# Patient Record
Sex: Female | Born: 1980 | Race: White | Hispanic: No | Marital: Single | Smoking: Former smoker
Health system: Southern US, Community
[De-identification: ages and names within clinical notes are randomized; demographics above are authoritative.]

## PROBLEM LIST (undated history)

## (undated) DIAGNOSIS — R569 Unspecified convulsions: Secondary | ICD-10-CM

## (undated) DIAGNOSIS — N200 Calculus of kidney: Secondary | ICD-10-CM

## (undated) DIAGNOSIS — J45909 Unspecified asthma, uncomplicated: Secondary | ICD-10-CM

## (undated) DIAGNOSIS — R197 Diarrhea, unspecified: Secondary | ICD-10-CM

## (undated) DIAGNOSIS — K589 Irritable bowel syndrome without diarrhea: Secondary | ICD-10-CM

## (undated) DIAGNOSIS — D509 Iron deficiency anemia, unspecified: Secondary | ICD-10-CM

## (undated) DIAGNOSIS — K3184 Gastroparesis: Secondary | ICD-10-CM

## (undated) DIAGNOSIS — G8929 Other chronic pain: Secondary | ICD-10-CM

## (undated) DIAGNOSIS — R112 Nausea with vomiting, unspecified: Secondary | ICD-10-CM

## (undated) DIAGNOSIS — K9089 Other intestinal malabsorption: Secondary | ICD-10-CM

## (undated) DIAGNOSIS — K219 Gastro-esophageal reflux disease without esophagitis: Secondary | ICD-10-CM

## (undated) DIAGNOSIS — N3010 Interstitial cystitis (chronic) without hematuria: Secondary | ICD-10-CM

## (undated) DIAGNOSIS — M797 Fibromyalgia: Secondary | ICD-10-CM

## (undated) DIAGNOSIS — G43909 Migraine, unspecified, not intractable, without status migrainosus: Secondary | ICD-10-CM

## (undated) DIAGNOSIS — K2100 Gastro-esophageal reflux disease with esophagitis, without bleeding: Secondary | ICD-10-CM

## (undated) DIAGNOSIS — R109 Unspecified abdominal pain: Secondary | ICD-10-CM

## (undated) DIAGNOSIS — A6920 Lyme disease, unspecified: Secondary | ICD-10-CM

## (undated) HISTORY — PX: SINUSOTOMY: SHX291

## (undated) HISTORY — PX: CHOLECYSTECTOMY: SHX55

## (undated) HISTORY — PX: PORT-A-CATH REMOVAL: SHX5289

## (undated) HISTORY — PX: OTHER SURGICAL HISTORY: SHX169

## (undated) HISTORY — PX: NASAL SINUS SURGERY: SHX719

## (undated) HISTORY — DX: Iron deficiency anemia, unspecified: D50.9

## (undated) HISTORY — PX: EXPLORATORY LAPAROTOMY: SUR591

---
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
## 2001-07-20 ENCOUNTER — Encounter: Payer: Self-pay | Admitting: Emergency Medicine

## 2001-07-20 ENCOUNTER — Emergency Department (HOSPITAL_COMMUNITY): Admission: EM | Admit: 2001-07-20 | Discharge: 2001-07-20 | Payer: Self-pay | Admitting: Emergency Medicine

## 2001-10-13 ENCOUNTER — Ambulatory Visit (HOSPITAL_COMMUNITY): Admission: RE | Admit: 2001-10-13 | Discharge: 2001-10-13 | Payer: Self-pay | Admitting: Pediatrics

## 2001-10-13 ENCOUNTER — Encounter: Payer: Self-pay | Admitting: Pediatrics

## 2001-11-24 ENCOUNTER — Encounter: Payer: Self-pay | Admitting: Urology

## 2001-11-24 ENCOUNTER — Encounter: Admission: RE | Admit: 2001-11-24 | Discharge: 2001-11-24 | Payer: Self-pay | Admitting: Urology

## 2001-12-22 ENCOUNTER — Other Ambulatory Visit: Admission: RE | Admit: 2001-12-22 | Discharge: 2001-12-22 | Payer: Self-pay | Admitting: Obstetrics & Gynecology

## 2002-02-13 ENCOUNTER — Emergency Department (HOSPITAL_COMMUNITY): Admission: EM | Admit: 2002-02-13 | Discharge: 2002-02-13 | Payer: Self-pay

## 2002-06-11 ENCOUNTER — Encounter: Payer: Self-pay | Admitting: Emergency Medicine

## 2002-06-11 ENCOUNTER — Emergency Department (HOSPITAL_COMMUNITY): Admission: EM | Admit: 2002-06-11 | Discharge: 2002-06-11 | Payer: Self-pay | Admitting: Emergency Medicine

## 2003-04-15 ENCOUNTER — Emergency Department (HOSPITAL_COMMUNITY): Admission: EM | Admit: 2003-04-15 | Discharge: 2003-04-16 | Payer: Self-pay | Admitting: Emergency Medicine

## 2003-04-16 ENCOUNTER — Encounter: Payer: Self-pay | Admitting: Emergency Medicine

## 2003-04-19 ENCOUNTER — Other Ambulatory Visit: Admission: RE | Admit: 2003-04-19 | Discharge: 2003-04-19 | Payer: Self-pay | Admitting: Obstetrics & Gynecology

## 2003-06-14 ENCOUNTER — Encounter: Payer: Self-pay | Admitting: Emergency Medicine

## 2003-06-14 ENCOUNTER — Emergency Department (HOSPITAL_COMMUNITY): Admission: EM | Admit: 2003-06-14 | Discharge: 2003-06-14 | Payer: Self-pay | Admitting: Emergency Medicine

## 2003-07-28 ENCOUNTER — Encounter: Payer: Self-pay | Admitting: Emergency Medicine

## 2003-07-28 ENCOUNTER — Emergency Department (HOSPITAL_COMMUNITY): Admission: EM | Admit: 2003-07-28 | Discharge: 2003-07-29 | Payer: Self-pay | Admitting: Emergency Medicine

## 2004-06-12 ENCOUNTER — Other Ambulatory Visit: Admission: RE | Admit: 2004-06-12 | Discharge: 2004-06-12 | Payer: Self-pay | Admitting: Obstetrics & Gynecology

## 2004-06-20 ENCOUNTER — Ambulatory Visit (HOSPITAL_COMMUNITY): Admission: RE | Admit: 2004-06-20 | Discharge: 2004-06-20 | Payer: Self-pay | Admitting: Obstetrics & Gynecology

## 2004-06-20 ENCOUNTER — Encounter (INDEPENDENT_AMBULATORY_CARE_PROVIDER_SITE_OTHER): Payer: Self-pay | Admitting: Specialist

## 2004-08-11 ENCOUNTER — Encounter (INDEPENDENT_AMBULATORY_CARE_PROVIDER_SITE_OTHER): Payer: Self-pay | Admitting: *Deleted

## 2004-08-11 ENCOUNTER — Ambulatory Visit (HOSPITAL_COMMUNITY): Admission: RE | Admit: 2004-08-11 | Discharge: 2004-08-11 | Payer: Self-pay | Admitting: Urology

## 2004-10-20 ENCOUNTER — Emergency Department (HOSPITAL_COMMUNITY): Admission: EM | Admit: 2004-10-20 | Discharge: 2004-10-20 | Payer: Self-pay | Admitting: Emergency Medicine

## 2005-02-02 ENCOUNTER — Other Ambulatory Visit: Admission: RE | Admit: 2005-02-02 | Discharge: 2005-02-02 | Payer: Self-pay | Admitting: Obstetrics & Gynecology

## 2005-02-07 ENCOUNTER — Emergency Department (HOSPITAL_COMMUNITY): Admission: EM | Admit: 2005-02-07 | Discharge: 2005-02-08 | Payer: Self-pay | Admitting: Emergency Medicine

## 2005-03-01 ENCOUNTER — Inpatient Hospital Stay (HOSPITAL_COMMUNITY): Admission: EM | Admit: 2005-03-01 | Discharge: 2005-03-04 | Payer: Self-pay | Admitting: Emergency Medicine

## 2005-03-01 ENCOUNTER — Ambulatory Visit: Payer: Self-pay | Admitting: Gastroenterology

## 2005-06-22 ENCOUNTER — Encounter: Admission: RE | Admit: 2005-06-22 | Discharge: 2005-06-22 | Payer: Self-pay | Admitting: Allergy and Immunology

## 2005-07-25 ENCOUNTER — Emergency Department (HOSPITAL_COMMUNITY): Admission: EM | Admit: 2005-07-25 | Discharge: 2005-07-25 | Payer: Self-pay | Admitting: Emergency Medicine

## 2005-07-26 ENCOUNTER — Encounter: Admission: RE | Admit: 2005-07-26 | Discharge: 2005-07-26 | Payer: Self-pay | Admitting: Family Medicine

## 2005-07-31 ENCOUNTER — Ambulatory Visit (HOSPITAL_COMMUNITY): Admission: RE | Admit: 2005-07-31 | Discharge: 2005-07-31 | Payer: Self-pay | Admitting: Urology

## 2005-07-31 ENCOUNTER — Ambulatory Visit (HOSPITAL_BASED_OUTPATIENT_CLINIC_OR_DEPARTMENT_OTHER): Admission: RE | Admit: 2005-07-31 | Discharge: 2005-07-31 | Payer: Self-pay | Admitting: Urology

## 2006-07-16 ENCOUNTER — Emergency Department (HOSPITAL_COMMUNITY): Admission: EM | Admit: 2006-07-16 | Discharge: 2006-07-17 | Payer: Self-pay | Admitting: Emergency Medicine

## 2006-08-21 ENCOUNTER — Emergency Department (HOSPITAL_COMMUNITY): Admission: EM | Admit: 2006-08-21 | Discharge: 2006-08-21 | Payer: Self-pay | Admitting: Emergency Medicine

## 2006-08-23 ENCOUNTER — Ambulatory Visit (HOSPITAL_BASED_OUTPATIENT_CLINIC_OR_DEPARTMENT_OTHER): Admission: RE | Admit: 2006-08-23 | Discharge: 2006-08-23 | Payer: Self-pay | Admitting: Urology

## 2006-09-25 ENCOUNTER — Inpatient Hospital Stay (HOSPITAL_COMMUNITY): Admission: EM | Admit: 2006-09-25 | Discharge: 2006-09-28 | Payer: Self-pay | Admitting: Urology

## 2007-02-18 ENCOUNTER — Emergency Department (HOSPITAL_COMMUNITY): Admission: EM | Admit: 2007-02-18 | Discharge: 2007-02-18 | Payer: Self-pay | Admitting: Emergency Medicine

## 2007-02-19 ENCOUNTER — Emergency Department (HOSPITAL_COMMUNITY): Admission: EM | Admit: 2007-02-19 | Discharge: 2007-02-19 | Payer: Self-pay | Admitting: Emergency Medicine

## 2007-02-24 ENCOUNTER — Ambulatory Visit: Payer: Self-pay | Admitting: Infectious Diseases

## 2007-03-17 DIAGNOSIS — Z87442 Personal history of urinary calculi: Secondary | ICD-10-CM

## 2007-03-17 DIAGNOSIS — E785 Hyperlipidemia, unspecified: Secondary | ICD-10-CM

## 2007-03-17 DIAGNOSIS — K589 Irritable bowel syndrome without diarrhea: Secondary | ICD-10-CM

## 2007-03-17 DIAGNOSIS — N3010 Interstitial cystitis (chronic) without hematuria: Secondary | ICD-10-CM | POA: Insufficient documentation

## 2007-03-17 DIAGNOSIS — IMO0001 Reserved for inherently not codable concepts without codable children: Secondary | ICD-10-CM

## 2007-03-17 DIAGNOSIS — G43909 Migraine, unspecified, not intractable, without status migrainosus: Secondary | ICD-10-CM | POA: Insufficient documentation

## 2007-04-14 ENCOUNTER — Encounter: Payer: Self-pay | Admitting: Infectious Diseases

## 2007-07-21 ENCOUNTER — Ambulatory Visit: Payer: Self-pay | Admitting: Gastroenterology

## 2007-08-13 ENCOUNTER — Encounter: Payer: Self-pay | Admitting: Gastroenterology

## 2007-08-13 ENCOUNTER — Ambulatory Visit: Payer: Self-pay | Admitting: Gastroenterology

## 2007-08-13 DIAGNOSIS — K209 Esophagitis, unspecified without bleeding: Secondary | ICD-10-CM | POA: Insufficient documentation

## 2007-08-13 DIAGNOSIS — K644 Residual hemorrhoidal skin tags: Secondary | ICD-10-CM | POA: Insufficient documentation

## 2007-10-24 ENCOUNTER — Ambulatory Visit: Payer: Self-pay | Admitting: Gastroenterology

## 2008-02-25 DIAGNOSIS — M129 Arthropathy, unspecified: Secondary | ICD-10-CM | POA: Insufficient documentation

## 2008-02-25 DIAGNOSIS — F329 Major depressive disorder, single episode, unspecified: Secondary | ICD-10-CM

## 2008-02-25 DIAGNOSIS — F411 Generalized anxiety disorder: Secondary | ICD-10-CM | POA: Insufficient documentation

## 2008-02-25 DIAGNOSIS — K219 Gastro-esophageal reflux disease without esophagitis: Secondary | ICD-10-CM

## 2008-02-25 DIAGNOSIS — J301 Allergic rhinitis due to pollen: Secondary | ICD-10-CM | POA: Insufficient documentation

## 2008-02-25 DIAGNOSIS — J45909 Unspecified asthma, uncomplicated: Secondary | ICD-10-CM | POA: Insufficient documentation

## 2008-02-25 DIAGNOSIS — A6920 Lyme disease, unspecified: Secondary | ICD-10-CM

## 2008-08-13 ENCOUNTER — Ambulatory Visit (HOSPITAL_BASED_OUTPATIENT_CLINIC_OR_DEPARTMENT_OTHER): Admission: RE | Admit: 2008-08-13 | Discharge: 2008-08-13 | Payer: Self-pay | Admitting: Urology

## 2009-09-28 ENCOUNTER — Ambulatory Visit: Payer: Self-pay | Admitting: Gastroenterology

## 2009-10-11 ENCOUNTER — Ambulatory Visit: Payer: Self-pay | Admitting: Diagnostic Radiology

## 2009-10-11 ENCOUNTER — Emergency Department (HOSPITAL_BASED_OUTPATIENT_CLINIC_OR_DEPARTMENT_OTHER): Admission: EM | Admit: 2009-10-11 | Discharge: 2009-10-11 | Payer: Self-pay | Admitting: Emergency Medicine

## 2010-02-08 ENCOUNTER — Emergency Department (HOSPITAL_COMMUNITY): Admission: EM | Admit: 2010-02-08 | Discharge: 2010-02-09 | Payer: Self-pay | Admitting: Emergency Medicine

## 2010-02-08 ENCOUNTER — Ambulatory Visit (HOSPITAL_COMMUNITY): Admission: RE | Admit: 2010-02-08 | Discharge: 2010-02-08 | Payer: Self-pay | Admitting: Infectious Diseases

## 2010-02-09 ENCOUNTER — Telehealth: Payer: Self-pay | Admitting: Gastroenterology

## 2010-02-13 ENCOUNTER — Ambulatory Visit (HOSPITAL_COMMUNITY): Admission: RE | Admit: 2010-02-13 | Discharge: 2010-02-13 | Payer: Self-pay | Admitting: Infectious Diseases

## 2010-02-27 ENCOUNTER — Encounter: Payer: Self-pay | Admitting: Gastroenterology

## 2010-03-06 ENCOUNTER — Encounter: Payer: Self-pay | Admitting: Gastroenterology

## 2010-09-18 ENCOUNTER — Emergency Department (HOSPITAL_COMMUNITY): Admission: EM | Admit: 2010-09-18 | Discharge: 2010-09-19 | Payer: Self-pay | Admitting: Emergency Medicine

## 2010-09-18 ENCOUNTER — Ambulatory Visit: Payer: Self-pay | Admitting: Gastroenterology

## 2010-09-25 ENCOUNTER — Ambulatory Visit: Payer: Self-pay | Admitting: Gastroenterology

## 2010-09-25 ENCOUNTER — Telehealth: Payer: Self-pay | Admitting: Gastroenterology

## 2010-09-25 ENCOUNTER — Inpatient Hospital Stay (HOSPITAL_COMMUNITY): Admission: AD | Admit: 2010-09-25 | Discharge: 2010-09-29 | Payer: Self-pay | Admitting: Gastroenterology

## 2010-09-25 DIAGNOSIS — R112 Nausea with vomiting, unspecified: Secondary | ICD-10-CM

## 2010-09-25 DIAGNOSIS — R197 Diarrhea, unspecified: Secondary | ICD-10-CM

## 2010-09-27 ENCOUNTER — Encounter: Payer: Self-pay | Admitting: Gastroenterology

## 2010-09-28 ENCOUNTER — Encounter: Payer: Self-pay | Admitting: Gastroenterology

## 2010-10-02 ENCOUNTER — Encounter: Payer: Self-pay | Admitting: Gastroenterology

## 2010-10-04 ENCOUNTER — Telehealth: Payer: Self-pay | Admitting: Gastroenterology

## 2010-10-25 ENCOUNTER — Telehealth (INDEPENDENT_AMBULATORY_CARE_PROVIDER_SITE_OTHER): Payer: Self-pay | Admitting: *Deleted

## 2010-10-27 ENCOUNTER — Telehealth (INDEPENDENT_AMBULATORY_CARE_PROVIDER_SITE_OTHER): Payer: Self-pay | Admitting: *Deleted

## 2011-01-21 ENCOUNTER — Encounter: Payer: Self-pay | Admitting: Infectious Diseases

## 2011-01-29 ENCOUNTER — Other Ambulatory Visit: Payer: Self-pay | Admitting: Dermatology

## 2011-01-30 NOTE — Progress Notes (Signed)
Summary: Returned Receipt Verifing Delivery of Dismissal Letter  Returned receipt received verifing delivery of letter. Vara Guardian  October 27, 2010 9:39 AM

## 2011-01-30 NOTE — Assessment & Plan Note (Signed)
Summary: nausea,vomiting, diarrhea, abdominal pain/sheri    History of Present Illness Visit Type: Follow-up Visit Primary GI MD: Elie Goody MD [facility] Primary Provider: Darrow Bussing, MD Chief Complaint: Patient complains of lower abdominal pain and diarrhea which she is taking Lomotil for. These started in late August and she is complaining for the last 2 weeks her symptoms have been constant. She is having alot of nausea and vomiting, she is on a bland diet but that is not helping. She taking phenergan as needed. Her mother complains that patient is losing weak and weak. She has been sleeping alot. Yesterday she developed a headache that she cna not get to stop.  History of Present Illness:   PLEASANT 30 Y.O FEMALE KNOWN TO DR. Russella Dar  WHO UNDERWENT WORK-UP IN 2008 FOR C/O NAUSEA,AND DIARRHEA. SHE HAD MILD ESOPHAGITIS ON EGD, AND COLONOSCOPY WAS NEGATIVE INCLUDING RANDOM BX'S. SHE IS FELT TO HAVE IBS. SHE ALSO CARRIES DX OF CHRONIC LYME DISEASE ,INTERSTITIAL CYSTITS,DEPRESSION,AND A.D.D.  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  SHE COMES IN TODAY WITH C/O FEELING SICK OVER THE LAST 5 WEEKS. SHE HAS HAD PROGRESSIVE SXS OF DIARRHEA DESPITE USING LOMOTIL 6/DAY. SHE HAS ABDOMINAL CRAMPING AFTER ANY by mouth INTAKE AND IS HAVING 5-6 LOOSE BM'S/DAY,MUCOID NONBLOODY. NO FEVERS. SHE HAS NOT BEEN ON ANY ABX IN MULTIPLE MONTHS. SHE HAS ALSO BEEN HAVING NAUSEA AND VOMITING WHICH HAS GOTTEN TO THE POINT OVER THE PAST 5 DAYS THAT SHE CANNOT KEEP HER MEDS DOWN DESPITE  PHENERGAN, AND HAS NOT EATEN ANYTHING OVER THE PAST 48 HOURS. SHE C/O WEAKNESS AND DIZZINESS.  SHE WENT TO THE E.R ON 9/20 WITH THESE SXS AND HAD UNREMARKABLE  LABS, AND  CT ABD/PELVIS  WAS NEGATIVE.   GI Review of Systems    Reports abdominal pain, nausea, vomiting, and  weight loss.     Location of  Abdominal pain: lower abdomen.    Denies acid reflux, belching, bloating, chest pain, dysphagia with liquids, dysphagia with  solids, heartburn, loss of appetite, vomiting blood, and  weight gain.      Reports diarrhea.     Denies anal fissure, black tarry stools, change in bowel habit, constipation, diverticulosis, fecal incontinence, heme positive stool, hemorrhoids, irritable bowel syndrome, jaundice, light color stool, liver problems, rectal bleeding, and  rectal pain.    Current Medications (verified): 1)  Effexor Xr 75 Mg Xr24h-Cap (Venlafaxine Hcl) .... Take 3 Tablet  Every Morning 2)  Nexium 40 Mg Cpdr (Esomeprazole Magnesium) .... Take 1 Capsule  Every Morning 3)  Amphetamine-Dextroamphetamine 15 Mg Xr24h-Cap (Amphetamine-Dextroamphetamine) .... Once Daily As Needed 4)  Elmiron 100 Mg Caps (Pentosan Polysulfate Sodium) .... Take 2 Tablets Two Times A Day 5)  Phenazopyridine Hcl 100 Mg Tabs (Phenazopyridine Hcl) .... As Needed For Bladder Cramping 6)  Clonazepam 1 Mg Tabs (Clonazepam) .... Take 1 Tablet As Needed For Sleep 7)  Skelaxin 800 Mg Tabs (Metaxalone) .... Take Up To 3 Tablets Daily As Needed 8)  Enablex 7.5 Mg Xr24h-Tab (Darifenacin Hydrobromide) .... Every Evening 9)  Lyrica 25 Mg Caps (Pregabalin) .... Take 1 Tablet At Bedtime 10)  Hydroxyzine Hcl 25 Mg Tabs (Hydroxyzine Hcl) .... Once Daily 11)  Promethazine Hcl 25 Mg Tabs (Promethazine Hcl) .... As Needed 12)  Lamictal 150 Mg Tabs (Lamotrigine) .... Once Daily 13)  Budeprion Sr 150 Mg Xr12h-Tab (Bupropion Hcl) .... Once Daily 14)  Lomotil 2.5-0.025 Mg Tabs (Diphenoxylate-Atropine) .... Take Two - Three Tabs By Mouth 2-3 Times A Day  15)  Trileptal 150 Mg Tabs (Oxcarbazepine) .... Take One By Mouth Every Morning and Two By Mouth At Bedtime 16)  Seasonale 0.15-0.03 Mg Tabs (Levonorgest-Eth Estrad 91-Day) .... Take One By Mouth Once Daily  Allergies (verified): 1)  ! Ibuprofen 2)  ! Doxycycline  Past History:  Past Medical History: HEMORRHOIDS, EXTERNAL (ICD-455.3) ESOPHAGITIS (ICD-530.10) GERD (ICD-530.81) ALLERGIC RHINITIS, SEASONAL  (ICD-477.0) DEPRESSION (ICD-311) ANXIETY (ICD-300.00) ARTHRITIS (ICD-716.90) LYME DISEASE (ICD-088.81)/CHRONIC-FOLLOWED BY M.D IN [facility]. 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 Occupation: Consultant,UNEMPLYED CURRENTLY SECONDARYTO CHRONIC LYME Patient is a former smoker.  Alcohol Use - no Illicit Drug Use - no  Review of Systems       The patient complains of allergy/sinus, anxiety-new, arthritis/joint pain, back pain, depression-new, fatigue, fever, headaches-new, muscle pains/cramps, night sweats, shortness of breath, sleeping problems, and thirst - excessive.  The patient denies anemia, blood in urine, breast changes/lumps, change in vision, confusion, cough, coughing up blood, fainting, hearing problems, heart murmur, heart rhythm changes, itching, menstrual pain, nosebleeds, pregnancy symptoms, skin rash, sore throat, swelling of feet/legs, swollen lymph glands, thirst - excessive , urination - excessive , urination changes/pain, urine leakage, vision changes, and voice change.         SEE HPI  Vital Signs:  Patient profile:   30 year old female Height:      68 inches Weight:      140.0 pounds BMI:     21.36 Temp:     99.3 degrees F oral Pulse rate:   70 / minute Pulse rhythm:   regular BP sitting:   102 / 70  (left arm) Cuff size:   regular  Vitals Entered By: Harlow Mares CMA Duncan Dull) (September 25, 2010 2:23 PM)  Physical Exam  General:  Well developed,  well nourished, ILL APPEARING Head:  Normocephalic and atraumatic. Eyes:  PERRLA, no icterus. Mouth:  TONGUE AND LIPS DRY Neck:  Supple; no masses or thyromegaly. Lungs:  Clear throughout to auscultation. Heart:  Regular rate and rhythm; no murmurs, rubs,  or bruits. Abdomen:  SOFT, TENDER RMQ AND MID ABDOMEN ,NO GUARDING, NO MASS OR HSM,BS+ Rectal:  SMALL EXT. HEMORRHOID.STOOL BROWN HEME POSITIVE. Extremities:  No clubbing, cyanosis, edema or deformities noted. Neurologic:  Alert and  oriented x4;  grossly normal neurologically. Psych:  Alert and cooperative. Normal mood and affect.   Impression & Recommendations:  Problem # 1:  DIARRHEA (MWU-132.44) Assessment New 29 Y.O FEMALE WITH 5 WEEK HX OF  PROGRESSIVE ABDOMINAL PAIN,CRAMPING,NAUSEA,VOMITING AND DIARRHEA. ETIOLOGY IS UNCLEAR. PT HAS DXOF IBS BUT CURRENT SXS ARE SIGNIFICANTLY OUT OF PROPORTION TO HER USUAL IBS SXS. R/O INFECTIOUS GASTROENTERITIS, C.DIFF,CELIAC DISEASE, EOSINIOPHILIC GASTROENTERITIS,IBD. ALSO CONCERNED ABOUT COMPONENT OF WITHDRAWAL GIVEN SEVERAL PSYCHOTROPIC MEDS.  ADMIT TO [facility]  FOR HYDRATION, ANTIEMETICS,STOOL CULTURES,REPEAT LABS AND FURTHER DX EVALUATION . SHE MAY NEED EGD WITH SMALL BOWEL BX'X, AND FLEX. 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

## 2011-01-30 NOTE — Letter (Signed)
Summary: [facility]   Imported By: Sherian Rein 03/08/2010 13:09:39  _____________________________________________________________________  External Attachment:    Type:   Image     Comment:   External Document

## 2011-01-30 NOTE — Progress Notes (Signed)
Summary: triage   Phone Note Call from Patient Call back at Home Phone [phone]   Caller: Patient Call For: Dr. Russella Dar Reason for Call: Talk to Nurse Summary of Call: would like to sch a hopital f/u... per pt, she was told by Dr. Christella Hartigan to f/u in 1-2 wks Initial call taken by: Vallarie Mare,  October 04, 2010 4:20 PM  Follow-up for Phone Call        Patient has been discharged from the practice.  She has been advised we will see her in the ER for emergency only.  She can pick up her records. Follow-up by: Darcey Nora RN, CGRN,  October 05, 2010 10:05 AM

## 2011-01-30 NOTE — Letter (Signed)
Summary: Discharge Letter  [facility]  [address] [place], [place] [address]   Phone: [phone]  Fax: [phone]       10/02/2010 MRN: [id number]  [facility] [address] [place], [place]  [address]  Dear Ms. Grzesiak,   I find it necessary to inform you that I will not be able to provide medical care to you, because of prescription narcotic discrepancies discovered during your recent hospitalizion.  Since your condition requires medical attention, I suggest that you place yourself under the care of another physician without delay. If you desire, I will be available for emergency care for 30 days after you receive this letter.  This should give you ample time to select a physician of your choice from the many competent providers in this area. You may want to call the local medical society or [facility]'s physician referral service [phone]) for their assistance in locating a new physician. With your written authorization, I will make a copy of your medical record available to your new physician.   Sincerely,    Claudette Head MD [facility]

## 2011-01-30 NOTE — Progress Notes (Signed)
Summary: Dismissal Letter Sent by Certified Mail  Dismissal Letter sent by certified mail. Vara Guardian  October 25, 2010 8:57 AM

## 2011-01-30 NOTE — Miscellaneous (Signed)
Summary: Question about narcotic usage  Clinical Lists Changes   She is hospitalized at [facility] currently. Discrepancy has arisen about narcotic pain med usage, prescriptions.  Initially she explained she "rarely used" narcotic pain meds for any type of pain, "2-3 per week." [facility] prescription records show, however, that she has filled scripts for 120 pills of hydrocodon/acet (10/650) in mid June, mid July and late August (360 pills).  I confirmed with Dr. Hardin Negus, urology at [facility] on the phone today (she has a standing order for the meds for her chronic pain issues).  When I asked her about these 360 pills she picked up from [facility] she told me she has been "saving them up" for insurance reasons for a "long time" and that she has "hundreds of them at home."    I will forward this information to Dr. Logan Bores, her PCP and Dr. Russella Dar for their records.   Appended Document: Question about narcotic usage Reviewed above. Start discharge process from practice given above.  Appended Document: Question about narcotic usage Paperwork for you to fill out is on your desk.

## 2011-01-30 NOTE — Progress Notes (Signed)
Summary: Triage   Phone Note Call from Patient Call back at [phone]   Caller: Patient Call For: Dr. Russella Dar Reason for Call: Talk to Nurse Summary of Call: pt. went to ER last week. Continues w/vomiting Initial call taken by: Karna Christmas,  September 25, 2010 8:04 AM  Follow-up for Phone Call        patient was seen in the ER last week for abdominal pain, n&V and diarrhea.  Patient  now has low grade fever, worsening abdominal pain and continued vomiting.  She reports unable to eat anything but dry ceral and toast.  Patient will come in today and see Mike Gip PA at 2:00 Follow-up by: Darcey Nora RN, CGRN,  September 25, 2010 10:35 AM

## 2011-01-30 NOTE — Op Note (Signed)
Summary: Operative Report /[facility]  Operative Report [facility]   Imported By: Lennie Odor 03/14/2010 15:19:21  _____________________________________________________________________  External Attachment:    Type:   Image     Comment:   External Document

## 2011-01-30 NOTE — Progress Notes (Signed)
Summary: triage   Phone Note Call from Patient Call back at [phone]   Caller: Patient Call For: Dana Lozano Reason for Call: Talk to Nurse Summary of Call: Patient has a lot of pain on her right side under her rib cage that's shooting to her back. Patient states that she was seen in the ER and was told that she has a enlarged GB and Liver, wants to be seen before first available 3-9 Initial call taken by: Tawni Levy,  February 09, 2010 4:19 PM  Follow-up for Phone Call        Patient  has been seen in the ER x 2 and told that her pain is most likely from her gallbladder. She has a HIDA scan ordered by her primary care for next Monday.  I have advised her to have HIDA and scheduled her an appointment with Dr Dana Lozano for 03-08-10.  She will call back and cancel appointment with stark if HIDA is abnormal and she is sent to a surgeon.  She is advised to avoid greasy/fatty food Follow-up by: Darcey Nora RN, CGRN,  February 09, 2010 4:36 PM

## 2011-02-28 ENCOUNTER — Telehealth: Payer: Self-pay | Admitting: Gastroenterology

## 2011-03-05 ENCOUNTER — Telehealth: Payer: Self-pay | Admitting: Gastroenterology

## 2011-03-08 NOTE — Letter (Signed)
Summary: Dismissal Activation Form, Return Reciept  Dismissal Activation Form, Return Reciept   Imported By: Maryln Gottron 03/02/2011 15:03:13  _____________________________________________________________________  External Attachment:    Type:   Image     Comment:   External Document

## 2011-03-08 NOTE — Progress Notes (Signed)
Summary: Peer-to-peer   Phone Note Call from Patient   Caller: Mary from Dr. Toma Copier Call For: Dr. Christella Hartigan Reason for Call: Talk to Nurse Summary of Call: Mary from Dr. Fransisco Beau office is calling to request a peer-to-peer with Dr. Christella Hartigan concerning this patients care, says that it is urgent that he speak with Dr. Christella Hartigan, you can reach him at   [phone], it looks like she is a patient of Dr. Russella Dar and was discharged from our practice but they are requesting to speak with Christella Hartigan? Initial call taken by: [place] Johnson,  February 28, 2011 11:25 AM  Follow-up for Phone Call        I left message on his VM Follow-up by: Rachael Fee MD,  February 28, 2011 11:56 AM

## 2011-03-13 NOTE — Progress Notes (Signed)
Summary: In-Patient Billing   Phone Note Other Incoming   Caller: Peggy with [employer] Reason for Call: Discuss billing issue Details for Reason: Hospital Admission Summary of Call: Appeal Status, Denied. Stated that patient's hospitalization from 09/26/10 to 09/29/10 is not approved, their MD reviewer deemed "not medically necessary"   Letter sent to our office, [facility] and to patient. Initial call taken by: Dwan Bolt,  March 05, 2011 2:32 PM

## 2011-03-15 LAB — DIFFERENTIAL
Basophils Relative: 0 % (ref 0–1)
Eosinophils Absolute: 0.1 10*3/uL (ref 0.0–0.7)
Lymphocytes Relative: 53 % — ABNORMAL HIGH (ref 12–46)
Lymphs Abs: 4 10*3/uL (ref 0.7–4.0)
Monocytes Relative: 7 % (ref 3–12)
Neutro Abs: 2.9 10*3/uL (ref 1.7–7.7)
Neutrophils Relative %: 38 % — ABNORMAL LOW (ref 43–77)

## 2011-03-15 LAB — CBC
HCT: 31.7 % — ABNORMAL LOW (ref 36.0–46.0)
Hemoglobin: 12.3 g/dL (ref 12.0–15.0)
MCH: 29.5 pg (ref 26.0–34.0)
MCH: 29.5 pg (ref 26.0–34.0)
MCHC: 33.9 g/dL (ref 30.0–36.0)
MCV: 87.1 fL (ref 78.0–100.0)
Platelets: 316 10*3/uL (ref 150–400)
Platelets: 349 10*3/uL (ref 150–400)
RBC: 3.9 MIL/uL (ref 3.87–5.11)
RBC: 4.18 MIL/uL (ref 3.87–5.11)
RDW: 13.4 % (ref 11.5–15.5)
RDW: 13.5 % (ref 11.5–15.5)
RDW: 14.1 % (ref 11.5–15.5)
WBC: 6.9 10*3/uL (ref 4.0–10.5)
WBC: 7.6 10*3/uL (ref 4.0–10.5)

## 2011-03-15 LAB — STOOL CULTURE

## 2011-03-15 LAB — COMPREHENSIVE METABOLIC PANEL
ALT: 14 U/L (ref 0–35)
AST: 16 U/L (ref 0–37)
Albumin: 4.1 g/dL (ref 3.5–5.2)
BUN: 10 mg/dL (ref 6–23)
CO2: 25 mEq/L (ref 19–32)
Chloride: 105 mEq/L (ref 96–112)
GFR calc Af Amer: >60 mL/min (ref >60)
GFR calc Af Amer: >60 mL/min (ref >60)
GFR calc non Af Amer: >60 mL/min (ref >60)
GFR calc non Af Amer: >60 mL/min (ref >60)
Glucose, Bld: 87 mg/dL (ref 70–99)
Potassium: 3.8 mEq/L (ref 3.5–5.1)
Potassium: 4.1 mEq/L (ref 3.5–5.1)
Sodium: 136 mEq/L (ref 135–145)
Sodium: 138 mEq/L (ref 135–145)
Total Bilirubin: 0.1 mg/dL — ABNORMAL LOW (ref 0.3–1.2)

## 2011-03-15 LAB — CLOSTRIDIUM DIFFICILE BY PCR: Toxigenic C. Difficile by PCR: NOT DETECTED

## 2011-03-15 LAB — FECAL LACTOFERRIN, QUANT: Fecal Lactoferrin: POSITIVE

## 2011-03-15 LAB — C-REACTIVE PROTEIN: CRP: 0.4 mg/dL — ABNORMAL LOW (ref <0.6)

## 2011-03-15 LAB — H. PYLORI ANTIBODY, IGG: H Pylori IgG: <0.4 {ISR}

## 2011-03-15 LAB — PREGNANCY, URINE: Preg Test, Ur: NEGATIVE

## 2011-03-15 LAB — URINALYSIS, ROUTINE W REFLEX MICROSCOPIC
Hgb urine dipstick: NEGATIVE
Nitrite: NEGATIVE
Specific Gravity, Urine: 1.035 — ABNORMAL HIGH (ref 1.005–1.030)

## 2011-03-21 LAB — COMPREHENSIVE METABOLIC PANEL
Albumin: 4.4 g/dL (ref 3.5–5.2)
BUN: 7 mg/dL (ref 6–23)
Calcium: 9.5 mg/dL (ref 8.4–10.5)
Creatinine, Ser: 0.65 mg/dL (ref 0.4–1.2)
Glucose, Bld: 84 mg/dL (ref 70–99)
Potassium: 3.7 mEq/L (ref 3.5–5.1)
Total Protein: 7.4 g/dL (ref 6.0–8.3)

## 2011-03-21 LAB — URINALYSIS, ROUTINE W REFLEX MICROSCOPIC
Glucose, UA: NEGATIVE mg/dL
Ketones, ur: NEGATIVE mg/dL
Nitrite: NEGATIVE
Protein, ur: NEGATIVE mg/dL
Urobilinogen, UA: 0.2 mg/dL (ref 0.0–1.0)

## 2011-03-21 LAB — POCT PREGNANCY, URINE: Preg Test, Ur: NEGATIVE

## 2011-04-05 LAB — COMPREHENSIVE METABOLIC PANEL
AST: 28 U/L (ref 0–37)
CO2: 30 mEq/L (ref 19–32)
Calcium: 10 mg/dL (ref 8.4–10.5)
Chloride: 103 mEq/L (ref 96–112)
Creatinine, Ser: 0.9 mg/dL (ref 0.4–1.2)
GFR calc Af Amer: >60 mL/min (ref >60)
GFR calc non Af Amer: >60 mL/min (ref >60)
Glucose, Bld: 82 mg/dL (ref 70–99)
Total Bilirubin: 0.4 mg/dL (ref 0.3–1.2)

## 2011-04-05 LAB — CBC
HCT: 35.9 % — ABNORMAL LOW (ref 36.0–46.0)
Hemoglobin: 12.2 g/dL (ref 12.0–15.0)
MCHC: 33.9 g/dL (ref 30.0–36.0)
MCV: 88.9 fL (ref 78.0–100.0)
RBC: 4.04 MIL/uL (ref 3.87–5.11)
WBC: 8.8 10*3/uL (ref 4.0–10.5)

## 2011-04-05 LAB — URINALYSIS, ROUTINE W REFLEX MICROSCOPIC
Bilirubin Urine: NEGATIVE
Ketones, ur: 15 mg/dL — AB
Nitrite: NEGATIVE
Urobilinogen, UA: 0.2 mg/dL (ref 0.0–1.0)

## 2011-04-05 LAB — DIFFERENTIAL
Basophils Absolute: 0 10*3/uL (ref 0.0–0.1)
Eosinophils Absolute: 0.2 10*3/uL (ref 0.0–0.7)
Eosinophils Relative: 2 % (ref 0–5)
Lymphocytes Relative: 51 % — ABNORMAL HIGH (ref 12–46)
Lymphs Abs: 4.5 10*3/uL — ABNORMAL HIGH (ref 0.7–4.0)
Neutrophils Relative %: 40 % — ABNORMAL LOW (ref 43–77)

## 2011-04-05 LAB — PREGNANCY, URINE: Preg Test, Ur: NEGATIVE

## 2011-05-15 NOTE — Assessment & Plan Note (Signed)
[facility]                         GASTROENTEROLOGY OFFICE NOTE   NAME:Dana Lozano, Dana Lozano                      MRN:          [id number]  DATE:07/21/2007                            DOB:          20-Sep-1981    REFERRING PHYSICIAN:  Duncan Dull, M.D.   REASON FOR CONSULTATION:  Diarrhea, nausea, vomiting, and small volume  hematochezia.   HISTORY OF PRESENT ILLNESS:  This is a 30 year old white female who is  here today with her mother.  I saw her in March 2006 when she was  hospitalized for similar symptoms as she has today.  In addition to Dr.  Kevan Ny she is followed by Dr. Stephannie Peters and Nelly Rout PA at [facility].  She sees Dr. Marcelyn Bruins for management of long  term interstitial cystitis.  She relates that her nausea and vomiting  have been an intermittent problem since she was diagnosed with  interstitial cystitis in 2005.  She relates frequent problems with  crampy lower abdominal pain associated with urgent postprandial diarrhea  for the past several months.  She has noted small amounts of bright red  blood per rectum.  She previously has had problems with constipation.  She relates a slight loss of appetite, but she has gained 20 pounds over  the past seven or eight months.  She brings with her blood work from May  2008 and June 2008 with CBC and comprehensive metabolic panel at both  times which were entirely normal.   PAST MEDICAL HISTORY:  Asthma, arthritis, anxiety, depression, kidney  stones, chronic headaches, allergic rhinitis, interstitial cystitis,  Lyme disease.   CURRENT MEDICATIONS:  Listed on the chart.   MEDICATION ALLERGIES:  IBUPROFEN leading to swelling.   SOCIAL HISTORY/REVIEW OF SYSTEMS:  Per the handwritten form.   PHYSICAL EXAMINATION:  GENERAL:  A well-developed, well-nourished white  female who appears mildly anxious but in no distress.  VITAL SIGNS:  Height 5 feet 7 inches, weight 171.6  pounds, blood  pressure 90/48, pulse 76 and regular.  HEENT:  Anicteric sclerae, oropharynx clear.  CHEST:  Clear to auscultation bilaterally.  CARDIAC:  Regular rate and rhythm without murmurs appreciated.  ABDOMEN:  Soft with mild lower abdominal tenderness to deep palpation,  no rebound or guarding.  No palpable organomegaly, masses or hernias.  Normoactive bowel sounds.  RECTAL:  Deferred at time of colonoscopy.  NEUROLOGICAL:  Alert and oriented x3, grossly nonfocal.   ASSESSMENT/PLAN:  1. Urgent diarrhea with lower abdominal discomfort and especially in      the setting of interstitial cystitis her symptoms are typical for      irritable bowel syndrome.  She has small volume hematochezia which      may be attributable to hemorrhoids or another benign disorder.      Inflammatory bowel disease and other disorders need to be further      excluded.  Risks, benefits, and alternatives to colonoscopy with      possible biopsy and possible polypectomy discussed with the      patient.  She consents to proceed.  This will  be scheduled      electively.  In the interim we will begin treatment for irritable      bowel syndrome with Robinul Forte.  She may be also having side      effects from one of the multiple medications and herbal supplements      she takes.  I have asked her to discuss her medications and herbal      supplement with [facility] and Dr. Kevan Ny.  2. Intermittent nausea and vomiting.  Again need to exclude medication      or herbal supplement side effects. Rule out underlying      gastroesophageal reflux disease, ulcer disease, and anxiety related      symptoms.  Risks, benefits, and alternatives to upper endoscopy      with possible biopsy discussed with the patient.  She consents to      proceed.  This will be scheduled electively.     Venita Lick. Russella Dar, MD, [facility]  Electronically Signed    MTS/MedQ  DD: 07/29/2007  DT: 07/30/2007  Job #: [id number]    cc:   Duncan Dull, M.D.

## 2011-05-15 NOTE — Op Note (Signed)
Dana Lozano, SKAFF               ACCOUNT NO.:  [id number]   MEDICAL RECORD NO.:  [id number]          PATIENT TYPE:  AMB   LOCATION:  [facility]                         FACILITY:  [facility]   PHYSICIAN:  Jamison Neighbor, M.D.  DATE OF BIRTH:  1981-01-25   DATE OF PROCEDURE:  08/13/2008  DATE OF DISCHARGE:                               OPERATIVE REPORT   PREOPERATIVE DIAGNOSIS:  Interstitial cystitis.   POSTOPERATIVE DIAGNOSIS:  Interstitial cystitis.   PROCEDURE:  Cystoscopy, urethral calibration, hydrodistention of the  bladder, Marcaine and Pyridium installation, Marcaine and Kenalog  injection.   SURGEON:  Jamison Neighbor, M.D.   ANESTHESIA:  General.   COMPLICATIONS:  None.   DRAINS:  None.   BRIEF HISTORY:  This 30 year old female has chronic abdominal pain felt  to be secondary to interstitial cystitis which was complicated by the  fact that she has numerous other illnesses that impact on her bladder  and pelvis including chronic anxiety, chronic fatigue syndrome, Lyme's  disease, and depression.  The patient does feel that she has had some  improvement with chronic Lyme therapy consisting of multiple  antibiotics.  She felt that she was doing fairly well but now has had a  significant relapse in terms of her bladder symptoms.  She has requested  that a repeat hydrodistention be performed.  She understands that there  is no guarantee she will have a comparable response to what she has had  in the past.  She gave full informed consent.  She is aware that she may  require installation therapy, alternative medications or physical  therapy if she does not get the response that she would like to have.  The patient understands the risks and benefits of the procedure and gave  full informed consent.   PROCEDURE:  After successful induction of general anesthesia, the  patient was placed in the dorsal position, prepped with Betadine and  draped in the usual sterile fashion.  Careful  bimanual examination  revealed no significant cystocele, rectocele or enterocele.  There are  no masses on bimanual exam.  The urethra was palpably normal with no  signs of diverticulum.  The cystoscope was inserted.  The bladder was  carefully inspected.  No tumors or stones could be seen.  The patient's  previous biopsy and cauterization sites were identified.  Hydrodistention of bladder was performed.  The bladder was distended at  a pressure 100 cm of water for 5 minutes.  When the bladder was drained  there were very little in the way of glomerulations and no ulcers and  this was a major improvement and bladder capacity was just under 1100 mL  and has essentially returned to normal.  The bladder was drained.  A  mixture of Marcaine and Pyridium was left in the bladder.  Marcaine and  Kenalog were injected periurethrally.  The patient tolerated the  procedure well and was taken to the recovery room in good condition.  She received intraoperative  Toradol, Zofran and a B & O suppository.  She will be sent home with  adequate pain medication because  she is already on a combination of  antibiotics including Omnicef and Cipro and no additional antibiotic  therapy will be required.  She will return to see me in routine follow-  up.      Jamison Neighbor, M.D.  Electronically Signed     RJE/MEDQ  D:  08/13/2008  T:  08/13/2008  Job:  [id number]

## 2011-05-15 NOTE — Assessment & Plan Note (Signed)
[facility]                         GASTROENTEROLOGY OFFICE NOTE   NAME:[place], Dana Lozano                      MRN:          [id number]  DATE:10/24/2007                            DOB:          Aug 31, 1981    This is a return office visit for GERD. She has mild nausea. The  vomiting has totally resolved and her nausea has substantially decreased  since she has discontinued morphine. She is using probiotics on a daily  basis which appears to be helping with her irritable bowel syndrome. Her  reflux symptoms remain controlled on Nexium, but are not controlled  without medication. She states that she is generally doing better and  brings blood work with her today showing a normal chemistry panel and  CBC.   CURRENT MEDICATIONS:  Listed on the chart, updated and reviewed.   ALLERGIES:  IBUPROFEN leading to swelling.   PHYSICAL EXAMINATION:  GENERAL:  No acute distress.  VITAL SIGNS:  Weight 160.2 pounds, blood pressure 108/60, pulse 88 and  regular.   She was not re-examined.   ASSESSMENT AND PLAN:  1. Suspected irritable bowel syndrome. Currently, her symptoms are not      active and she remains on a probiotic on a daily basis.  2. GERD. Maintain standard anti-reflux measures and Nexium 40 mg p.o.      q.a.m. A refill for one year was supplied.  3. Nausea and vomiting, substantially resolved since discontinuing      morphine.  4. She will return to the care of Dr. Shaune Pollack and Dr. Stephannie Peters. I will see her again, if needed, on referral.     Judie Petit T. Russella Dar, MD, [facility]  Electronically Signed    MTS/MedQ  DD: 10/24/2007  DT: 10/26/2007  Job #: [id number]   cc:   Duncan Dull, M.D.

## 2011-05-18 NOTE — Op Note (Signed)
NAME:  Dana Lozano, Dana Lozano                         ACCOUNT NO.:  [id number]   MEDICAL RECORD NO.:  [id number]                   PATIENT TYPE:  AMB   LOCATION:  SDC                                  FACILITY:  [facility]   PHYSICIAN:  Ilda Mori, M.D.                DATE OF BIRTH:  25-Sep-1981   DATE OF PROCEDURE:  06/20/2004  DATE OF DISCHARGE:                                 OPERATIVE REPORT   ADDENDUM:  This was a diagnostic laparoscopy and LEEP cervical biopsy.  Before the completion of the laparoscopy, indigo carmine dye was instilled  through the cervix and through the uterus into the fallopian tubes and dye  was seen spilling freely and promptly from both tubes.  The fimbriae were  lush and completely normal.                                               Ilda Mori, M.D.    RK/MEDQ  D:  06/20/2004  T:  06/20/2004  Job:  [id number]

## 2011-05-18 NOTE — Op Note (Signed)
NAME:  Dana Lozano, Dana Lozano                         ACCOUNT NO.:  [id number]   MEDICAL RECORD NO.:  [id number]                   PATIENT TYPE:  AMB   LOCATION:  SDC                                  FACILITY:  [facility]   PHYSICIAN:  Ilda Mori, M.D.                DATE OF BIRTH:  1981-07-13   DATE OF PROCEDURE:  06/20/2004  DATE OF DISCHARGE:                                 OPERATIVE REPORT   PREOPERATIVE DIAGNOSES:  1. Chronic episodic superior pelvic pain.  2. High grade squamous intraepithelial neoplasia.   POSTOPERATIVE DIAGNOSES:  1. Normal pelvis.  2. High grade squamous intraepithelial lesion.   PROCEDURES:  1. Diagnostic laparoscopy, tubal lavage.  2. LEEP conization of the cervix.   SURGEON:  Ilda Mori, M.D.   ANESTHESIA:  General endotracheal anesthesia.   ESTIMATED BLOOD LOSS:  100 mL.   FINDINGS:  The pelvis was normal.  There was no evidence of endometriosis.  No evidence of pelvic adhesions.  The appendix looked normal.  The  gallbladder and liver margin looked normal.  The bowel appeared normal.  On  cervical evaluation with Lugol's stain, there was a very wide atypical  transformation zone encompassing the entire exocervix.   INDICATIONS FOR PROCEDURE:  This is a 30 year old gravida 0 who has had over  two years of severe episodic chronic lower abdominal pain.  From October  2003 to April of 2004, the patient weakness treated with Lupron.  From April  2004 until the present, the patient was on progesterone only low dose oral  contraceptives.  Despite this therapy, the patient continued to have  episodes of severe pelvic pain.  A decision was made to proceed with  laparoscopy to rule out endometriosis or pelvic adhesions.  Her second  problem was abnormal Pap smear.  She had high grade lesion on her Pap in May  of 2004.  Colposcopy was done which revealed only low grade dysplasia.  The  patient was asked to return for follow-up Pap smears which she did not  do  until June of 2005, where a repeat Pap showed the continual presence of high  grade lesion.  Because of the high grade lesion and the fact that her  previous colposcopy failed to diagnosis a high grade lesion, decision was  made to proceed with a LEEP cervical biopsy at the time of her previously  scheduled diagnostic laparoscopy.   DESCRIPTION OF PROCEDURE:  The patient was taken to the operating room and  placed in the dorsal lithotomy position.  The abdomen and perineum were  prepped and draped in sterile fashion.  An acorn catheter was placed against  the cervix and affixed to a tenaculum on the anterior lip.  The bladder was  drained.  The surgeon regowned and gloved.  Incision was made at the base of  the umbilicus. A Veress needle  was introduced into the peritoneal cavity  and  a pneumoperitoneum was created.  A 5 mm bladeless trocar was introduced  through the umbilical incision and a 5 mm laparoscope was placed.  Under  direct visualization, an accessory instrument was placed through the  suprapubic stab wound.  The pelvis was viewed with the findings noted above.  There was no evidence of pelvic pathology.  The gas was allowed to escape  and the incisions were closed with Dermabond. The surgeon then went to the  vaginal area.  A Teflon coated speculum was placed in the vagina and the  cervix was exposed.  The cervix was painted with Lugol's stain and the  abnormal T zone was noted to be quite wide.  A LEEP cone biopsy was  performed with a posterior lip being taken first.  The anterior lip being  taken second and an endocervical button removed as a third specimen.  Hemostasis was obtained using ball electrocautery at the base of the cervix  and Lugol's solution was then placed on the base of the cervix as well for  further hemostasis.  The procedure was then terminated at this point and the  patient left the operating room in good condition.                                                Ilda Mori, M.D.    RK/MEDQ  D:  06/20/2004  T:  06/20/2004  Job:  [id number]

## 2011-05-18 NOTE — H&P (Signed)
NAMEKAYDENCE, Dana Lozano               ACCOUNT NO.:  [id number]   MEDICAL RECORD NO.:  [id number]          PATIENT TYPE:  INP   LOCATION:  1415                         FACILITY:  [facility]   PHYSICIAN:  Jamison Neighbor, M.D.  DATE OF BIRTH:  04-22-1981   DATE OF ADMISSION:  09/25/2006  DATE OF DISCHARGE:  09/28/2006                              HISTORY & PHYSICAL   ADMISSION DIAGNOSES:  1. Chronic interstitial cystitis.  2. Pelvic pain.  3. Migraines.  4. Irritable bowel syndrome.   HISTORY:  This 30 year old female has known interstitial cystitis.  She  has been treated aggressively on an outpatient basis, but we have been  unable to get her pain under control.  The patient is being admitted to  the hospital for Foley catheter drainage, bladder instillation therapy,  and pain management.   The patient's past medical history is remarkable for severe interstitial  cystitis, poorly controlled despite aggressive medical therapy.  The  patient is also known to have irritable bowel syndrome with associated  constipation.  She also is known to have migraines and did have a  syncopal episode in March 2006.  The patient has a history of Lyme  disease.   The patient's previous surgical history is exploratory laparotomy as  well as a LEEP procedure.  She has had cystoscopy and hydrodistention on  separate occasions and has had bladder instillation therapy.   The patient's medications at the time of admission include:  1. Atarax 25 mg two tablets at night.  2. Singulair 10 mg daily.  3. Elmiron 100 mg two tablets b.i.d.  4. Effexor XR 75 mg t.i.d.  5. Nexium 40 mg daily.  6. Birth control pills.  7. Pyridium on a p.r.n. basis.  8. Wellbutrin 150 mg daily.  9. Lamictal one tablet daily.   FAMILY HISTORY:  Noncontributory.   SOCIAL HISTORY:  Unremarkable.  She does not smoke, and she drinks very  modest amounts of alcohol.  The patient is currently working and is also  a Consulting civil engineer at  [employer].   PHYSICAL EXAMINATION:  GENERAL:  The patient is a very ill-appearing  female.  VITAL SIGNS:  Temperature 97.5, pulse 80, respirations 18, blood  pressure 108/61.  HEENT:  Normocephalic, atraumatic.  Cranial nerves II-XII are grossly  intact.  NECK:  Supple with no adenopathy or thyromegaly.  LUNGS:  Clear.  CARDIAC:  A regular rate and rhythm without murmurs, thrills, gallops or  rubs.  ABDOMEN:  Soft but tremendous tenderness was noted throughout.  It was  somewhat diffuse without palpable masses, rebound or guarding.  EXTREMITIES:  No clubbing, cyanosis, or edema.  PELVIC:  Deferred at the time of admission.   The patient had a Foley catheter inserted and laboratory studies were  obtained, including a white count of 7.7, hematocrit 35.6, normal  electrolytes.  Urinalysis was nitrite-positive so it was cultured.  Urinalysis shows just an occasional white cell.   IMPRESSION:  Chronic pelvic pain probably secondary to interstitial  cystitis.   PLAN:  Admit for instillation therapy, laxatives to try and treat some  chronic obstipation,  and a CT scan for further evaluation of the pelvis.           ______________________________  Jamison Neighbor, M.D.  Electronically Signed     RJE/MEDQ  D:  11/26/2006  T:  11/27/2006  Job:  [id number]

## 2011-05-18 NOTE — Op Note (Signed)
NAMESTEELE, LEDONNE               ACCOUNT NO.:  [id number]   MEDICAL RECORD NO.:  [id number]          PATIENT TYPE:  AMB   LOCATION:  N[facility]C                         FACILITY:  [facility]   PHYSICIAN:  Jamison Neighbor, M.D.  DATE OF BIRTH:  10/28/1981   DATE OF PROCEDURE:  07/31/2005  DATE OF DISCHARGE:                                 OPERATIVE REPORT   PREOPERATIVE DIAGNOSIS:  Interstitial cystitis.   POSTOPERATIVE DIAGNOSIS:  Interstitial cystitis.   PROCEDURE:  1.  Cystoscopy.  2.  Urethral calibration.  3.  Hydrodistention of the bladder.  4.  Marcaine and Pyridium instillation.  5.  Marcaine and Kenalog injection.   SURGEON:  Dr. Logan Bores   ANESTHESIA:  General.   COMPLICATIONS:  None.   DRAINS:  None.   BRIEF HISTORY:  This 30 year old female is known to have interstitial  cystitis.  The patient did well with a combination of medications and a  previous hydrodistention but over the past few months, has really had a  significant deterioration.  We have talked to her about the possibility of  doing additional instillation therapy.  We have talked to her about  neurostimulation therapy.  We have talked to her about new experimental drug  trials.  The patient has decided to have a repeat hydrodistention because  she had nearly 1 year of relief after that was done.  She understands that  there is no guarantee that she will have the same improvement this time.  She gave full informed consent.   DESCRIPTION OF PROCEDURE:  After successful induction of general anesthesia,  the patient was placed in the dorsolithotomy position and prepped with  Betadine, draped in the usual sterile fashion.  Careful bimanual examination  showed no cystocele, rectocele, or enterocele.  There were no masses on  bimanual exam.  The urethra was in normal position with no signs of  diverticulum.  The urethra calibrated to 56 [place] with female urethral  sounds.  The cystoscope was inserted; the bladder  was carefully inspected.  It was free of any tumor or stones.  There was moderate squamous metaplasia  but no other abnormalities detected.  The bladder was distended at a  pressure of 100 cm of water for 5 minutes.  When the bladder was drained,  there was not much in the way of glomerulations or bleeding, but there was a  diminished bladder capacity of just over 800 mL.  This compares fairly  well to an average IC capacity of 575, but it is less than a normal capacity  of 1150.  There was nothing that required biopsy.  A mixture of Marcaine and  Pyridium was left within the bladder.  Marcaine and Kenalog were injected  periurethrally.  The patient tolerated the procedure well and was taken to  the recovery room in good condition.     _______________    RJE/MEDQ  D:  07/31/2005  T:  07/31/2005  Job:  [id number]

## 2011-05-18 NOTE — Discharge Summary (Signed)
NAMESHAWNTEL, Dana Lozano               ACCOUNT NO.:  [id number]   MEDICAL RECORD NO.:  [id number]          PATIENT TYPE:  INP   LOCATION:  1415                         FACILITY:  [facility]   PHYSICIAN:  Jamison Neighbor, M.D.  DATE OF BIRTH:  1981-10-10   DATE OF ADMISSION:  09/25/2006  DATE OF DISCHARGE:  09/28/2006                                 DISCHARGE SUMMARY   ADMISSION DIAGNOSIS:  Uncontrolled pelvic pain.   HISTORY:  This 30 year old female has known interstitial cystitis.  The  patient's pain is completely out of control.  Patient was seen in the  office, and it was felt nothing short of parenteral narcotics would control  her situation.  She is being admitted for pain management and additional  evaluation.   The patient's medications at the time of admission were Atarax 25 mg 2  tablets at night, Singulair 10 mg daily, Elmiron 2 tablets b.i.d., Effexor  XR 75 mg t.i.d., Nexium 40 mg daily, oral contraceptives, Pyridium p.r.n.,  Wellbutrin up to 2 mg daily, and Lamictal.   The patient's past medical history is primarily pertinent for her  interstitial cystitis.  She is also known to have issues with migraines and  asthma and does have a past history of Lyme's disease.  In the past, she has  had some problems with esophageal reflux.  She has also had a past history  of ureterolithiasis.   Patient's social history is pertinent for occasional alcohol.  She does not  use tobacco.  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   HOSPITAL COURSE:  Patient is admitted to the hospital for pain management.  Her laboratory studies showed she had a normal white count and normal  electrolytes.  Urinalysis was normal in terms of white cells.  It was  nitrite positive, so a culture was obtained.  The patient's pain management  did help to decrease her pain, and she did respond to catheter placement and  bladder instillation therapy.  She had a CT scan that was negative.  We  recommended that she be given laxatives to try and move her bowels and was  given her instillation therapy.  The patient continued on instillation and  did begin to feel better.  We monitored her over the weekend and felt that  she could be discontinued.  She was seen by Dr. Brunilda Payor, the on-call physician,  who felt that she could go home on September 28, 2006.  Patient will be  advised as to setting appropriate limits, in terms of her activities, and we  will see how she does over the next few weeks.           ______________________________  Jamison Neighbor, M.D.  Electronically Signed     RJE/MEDQ  D:  11/06/2006  T:  11/07/2006  Job:  [id number]

## 2011-05-18 NOTE — Discharge Summary (Signed)
Dana Lozano, Dana Lozano               ACCOUNT NO.:  [id number]   MEDICAL RECORD NO.:  [id number]          PATIENT TYPE:  INP   LOCATION:  1415                         FACILITY:  [facility]   PHYSICIAN:  Jamison Neighbor, M.D.  DATE OF BIRTH:  1981/07/04   DATE OF ADMISSION:  09/25/2006  DATE OF DISCHARGE:  09/28/2006                               DISCHARGE SUMMARY   ADMISSION DIAGNOSES:  1. Chronic interstitial cystitis.  2. Chronic pelvic pain.  3. History of migraines.   HISTORY:  This is a 30 year old female with severe chronic pelvic pain,  felt to be secondary to interstitial cystitis.  The patient has had a  significant inability to control her pain and it has gotten to the point  where she is completely uncontrolled.  We have tried to treat this on an  outpatient basis but have been unable to get the patient's pain under  control.  She is being admitted for IV fluids, pain control and bladder  instillation as well as possible cystoscopy and hydrodistention.   PAST MEDICAL HISTORY:  1. The patient's past medical history is remarkable for the      interstitial cystitis. She does have a past history of kidney      stones in the past.  She told us she has upper respiratory      infections but that is really not a major problem.  She is having      severe frequency and urgency and pelvic pain which at this point is      completely uncontrolled.  2. The patient is known to have problems with migraines and was      associated to cause a syncope back in March 2006.  3. She also has a possible past history of Lyme's disease.  4. The patient is known to have irritable bowel syndrome and has      chronic constipation requiring the use of enemas.  5. The patient also in the past has had a history of high-grade      squamous intraepithelial lesions of the reproductive tract.   ADMISSION MEDICATIONS:  Atarax, Singulair, Elmiron, Effexor XR, Nexium,  oral contraceptives, Pyridium, Wellbutrin,  and Lamictal.   HOSPITAL COURSE:  The patient was admitted on September 26, 2007and  placed on bed reset with Foley catheter drainage.  Laboratory studies  were obtained which were normal and CT scan was obtained with and  without contrast that did not show any significant findings.  The  patient was placed on a pain pump to try to get her symptoms under  control.  Because the patient was noted to be having real problems with  her bowels, she was placed on a bowel program.  It was felt that  laxatives might help to control her symptoms. She was started on  instillation therapy to try to get her pain better.  By September 28, 2006, she did feel that her symptoms were under control.  She is voiding  without much difficulty She is ready for discharge and will hve routine  followup    Dictation ended here.  ______________________________  Jamison Neighbor, M.D.  Electronically Signed     RJE/MEDQ  D:  11/26/2006  T:  11/27/2006  Job:  [id number]

## 2011-05-18 NOTE — H&P (Signed)
NAMEWAJIHA, VERSTEEG               ACCOUNT NO.:  [id number]   MEDICAL RECORD NO.:  [id number]          PATIENT TYPE:  INP   LOCATION:  0103                         [facility]AC[facility]  [facility]   PHYSICIAN:  Malcolm T. Russella Dar, M.D. [facility] OF BIRTH:  [birth year]   DATE OF ADMISSION:  03/01/2005  DATE OF DISCHARGE:                                HISTORY & PHYSICAL   CHIEF COMPLAINT:  Diarrhea, abdominal cramping, nausea, and weakness.   HISTORY:  Takiesha is a 30 year old white female new to GI today, who was  referred by Dr. Logan Bores.  The patient has a history of interstitial cystitis,  ureteral lithiasis, migraines.  She was most recently treated for a urinary  tract infection in the emergency room on February 08, 2005, with Bactrim DS  one p.o. b.i.d. x7 days.  At this time, she had onset about 6 days ago with  abdominal cramping, diarrhea, nausea, chills, and sweats.  She has not had  any documented fever that she is aware of.  She has been having 10-15 bowel  movements per day over the past couple of days, with small amounts of bright  red blood on the tissue, and mixed with the stool.  She is unable to keep  down much p.o., and any p.o. intake seems to aggravate the diarrhea.  She  has become progressively weak and dizzy, and apparently passed out coming  off the commode earlier this morning.  She was seen in our office by Dr.  Russella Dar, felt to be acutely ill and dehydrated, and is admitted with an  infectious colitis for rehydration and further diagnostic evaluation.   CURRENT MEDICATIONS:  1.  Effexor XR 75 t.i.d.  2.  Amitriptyline p.r.n.  3.  Birth control pills daily.  4.  Hydroxyzine HCL 25 mg 1-2 q.h.s.  5.  Pyridium p.r.n.  6.  Elmiron two p.o. b.i.d.   ALLERGIES:  IBUPROFEN.   FAMILY HISTORY:  Negative for inflammatory bowel disease, colon cancer, etc.  Mother does have interstitial cystitis.   SOCIAL HISTORY:  The patient is single.  She is a Consulting civil engineer at [employer].  She  lives in an  apartment with roommates.  She is a nonsmoker.  No regular ETOH.   PAST HISTORY:  Pertinent for ureteral lithiasis, interstitial cystitis,  migraine headaches, and previous Lyme disease.   REVIEW OF SYSTEMS:  CARDIOVASCULAR:  Denies any chest pain or anginal  symptoms.  PULMONARY:  Negative for cough, shortness of breath, or sputum  production.  GENITOURINARY:  Negative currently.  MUSCULOSKELETAL:  Negative.   LABORATORY STUDIES:  Pending.   PHYSICAL EXAMINATION:  GENERAL:  Well-developed white female in no acute  distress.  She is weak and ill appearing.  VITAL SIGNS:  Temperature 98, blood pressure 120/88, pulse 120, respirations  16.  HEENT:  Atraumatic and normocephalic.  Extraocular movements intact.  Pupils  equal, round and reactive to light and accommodation.  Sclerae are  anicteric.  Buccal mucosa is slightly dry.  CARDIOVASCULAR:  Regular rate and rhythm with S1 and S2.  Slightly  tachycardic.  PULMONARY:  Clear.  ABDOMEN:  Soft.  She is tender in the right lower quadrant, mid quadrant,  and suprapubic area.  There is no rebound.  Bowel sounds are active.  RECTAL:  hemocult negative mucus in the rectal vault, no lesions, no  tenderness  EXTREMITIES:  Without clubbing, cyanosis, or edema.  NEUROLOGIC:  Grossly nonfocal.   IMPRESSION:  28.  A 30 year old white female with a 6-day history of diarrhea, abdominal      cramping, nausea, and decreased oral intake.  Suspect infectious      colitis.  Rule out Clostridium difficile.  2.  Interstitial cystitis.  3.  Migraines.  4.  Ureteral lithiasis.  5.  Prior history of Lyme disease.   PLAN:  The patient is admitted to the service of Dr. Claudette Head for IV  fluid hydration, bowel rest, plain films, anti-emetics, stool cultures.  Will start empiric Flagyl and Cipro until cultures are back.  For details,  please see the orders.      AE/MEDQ  D:  03/01/2005  T:  03/01/2005  Job:  [id number]

## 2011-05-18 NOTE — Op Note (Signed)
NAMEGLENICE, CICCONE               ACCOUNT NO.:  [id number]   MEDICAL RECORD NO.:  [id number]          PATIENT TYPE:  AMB   LOCATION:  NESC                         FACILITY:  [facility]   PHYSICIAN:  Jamison Neighbor, M.D.  DATE OF BIRTH:  [birth year]   DATE OF PROCEDURE:  08/23/2006  DATE OF DISCHARGE:                                 OPERATIVE REPORT   SERVICE:  Urology.   PREOPERATIVE DIAGNOSIS:  Interstitial cystitis.   POSTOPERATIVE DIAGNOSIS:  Interstitial cystitis.   PROCEDURE:  Cystoscopy, urethral calibration, hydrodistention of the  bladder, Marcaine and Pyridium installation, Marcaine and Kenalog injection.   SURGEON:  Jamison Neighbor, M.D.   ANESTHESIA:  General.   COMPLICATIONS:  None.   DRAINS:  None.   BRIEF HISTORY:  This 30 year old female has had problems with longstanding  bladder pain and pelvic pain that is felt to be secondary to interstitial  cystitis. The patient has really developed worsening pain. She has requested  a repeat hydrodistention be performed. The last time she had a  hydrodistention she had an excellent response and this will be the third  time she has had this done. She is aware of the theoretical possibility that  this could actually worsen her condition and she certainly knows that there  is no guarantee she will have pain relief like she has had in the past. We  do feel however that since the patient has failed to respond to installation  therapy and is now narcotic dependent, we need to try to do something to  help her pain. The patient is now to undergo repeat cystoscopy and  hydrodistention. The patient understands the risks and benefits of the  procedure as described above. She gave full informed consent.   DESCRIPTION OF PROCEDURE:  After successful induction of general anesthesia,  the patient was placed in the dorsal lithotomy position, prepped with  Betadine and draped in the usual sterile fashion. Careful bimanual  examination  revealed no abnormalities of the urethra. She had no cystocele,  rectocele or enterocele. There are no masses on bimanual exam. The uterus  was palpably normal. The urethra was calibrated to 32-French with female  urethral sounds with no evidence of stenosis or stricture. The cystoscope  was inserted, the bladder was carefully inspected and was free of any tumor  or stones. Both ureteral orifices were normal in configuration and location.  The patient's previous bladder site could easily be identified. The bladder  was distended at a pressure of 100 cmH2O. When the bladder was drained very  few glomerulations could be seen indicating the patient has really had a  nice response to medical therapy. She had no ulcers or anything requiring  biopsy. The previously biopsied site could  easily be identified. The bladder was drained, a mixture of Marcaine and  Pyridium  was left in the bladder, Marcaine and Kenalog were injected  periurethrally. The patient tolerated the procedure well and was taken to  the recovery room in good condition.           ______________________________  Jamison Neighbor, M.D.  Electronically Signed     RJE/MEDQ  D:  08/23/2006  T:  08/23/2006  Job:  [id number]

## 2011-05-18 NOTE — H&P (Signed)
[name], Dana Lozano               ACCOUNT NO.:  [id number]   MEDICAL RECORD NO.:  [id number]          PATIENT TYPE:  INP   LOCATION:  1415                         FACILITY:  [facility]   PHYSICIAN:  Jam[place] Neighbor, M.D.  DATE OF BIRTH:  09/17/1981   DATE OF ADMISSION:  09/25/2006  DATE OF DISCHARGE:  09/28/2006                                HISTORY & PHYSICAL   ADMISSION DIAGNOSIS:  Interstitial cystitis with uncontrolled pelvic pain.   HISTORY:  This 30 year old female is known to have interstitial cystitis.  The patient's pain has become so severe that she cannot function and is to  be admitted for pain control.   PAST MEDICAL HISTORY:  Remarkable for the interstitial cystitis as well as  for some problems with esophageal reflux disease, migraines, a past history  of Lyme's disease as well as a past history of kidney stones.   Previous surgery includes a LEEP procedure in 2005, exploratory laparotomy  in 2005, cysto hydrodistention and bladder instillation along with Marcaine  and Kenalog injection back in May of this year.   The patient's social history is unremarkable.  She denies tobacco or  alcohol.  She does work part-time and is in school as well.  We have had  discussions with her about the fact that she is a little bit over-distended,  in terms of her obligations.   PHYSICAL EXAMINATION:  VITAL SIGNS:  On examination today, temperature 99.2,  pulse 82, respirations 18, blood pressure 113/66.  HEENT:  Normocephalic and atraumatic.  NEUROLOGIC:  Cranial nerves II-XII are grossly intact.  NECK:  Supple with no adenopathy or thyromegaly.  The spine was straight.  LUNGS:  Clear.  HEART:  Regular rate and rhythm with no murmurs, thrills, gallops, or rubs.  ABDOMEN:  Soft but painful and nondistended.  Bowel sounds are minimal.  EXTREMITIES:  No clubbing, cyanosis or edema.  NEUROVASCULAR:  Intact.  PELVIC:  Deferred but has been done recently and certainly is consistent  with IC, as there is chronic bladder pain but no other irregularities seen.   IMPRESSION:  Severe interstitial cystitis with poorly controlled pelvic  pain.   PLAN:  Admit for pain management as well as instillation therapy and  additional evaluation.           ______________________________  Jam[place] Neighbor, M.D.  Electronically Signed    RJE/MEDQ  D:  11/06/2006  T:  11/07/2006  Job:  [id number]

## 2011-05-18 NOTE — Op Note (Signed)
Dana Lozano, Dana Lozano               ACCOUNT NO.:  [id number]   MEDICAL RECORD NO.:  [id number]          PATIENT TYPE:  AMB   LOCATION:  DAY                          FACILITY:  [facility]   PHYSICIAN:  Jam[place] Neighbor, M.D.  DATE OF BIRTH:  1981-04-15   DATE OF PROCEDURE:  08/11/2004  DATE OF DISCHARGE:  08/11/2004                                 OPERATIVE REPORT   PREOPERATIVE DIAGNOSIS:  Interstitial cystitis.   POSTOPERATIVE DIAGNOSIS:  Same.   PROCEDURE:  Cystoscopy, urethral calibration, hydrodistention of the  bladder, Marcaine __________  injection.   SURGEON:  Jam[place] Neighbor, M.D.   ANESTHESIA:  General.   COMPLICATIONS:  None.   DRAINS:  None.   BRIEF HISTORY:  This is a 30 year old female who is undergoing evaluation  for interstitial cystitis.  The patient has the classic symptoms of urgency,  frequency, and pain, and is now to undergo diagnostic testing.  She  understands the risks and benefits of the procedure and gave full informed  consent.   PROCEDURE:  After successful induction of general anesthesia, the patient  was placed in the dorsal lithotomy position, prepped with Betadine, and  draped int the usual sterile fashion.  The urethra was carefully calibrated  to 65 [place] with female urethral sounds, with no evidence of stenosis or  stricture.  The patient had no cystocele, rectocele, or enterocele.  There  were no masses on bimanual exam.  Cystoscopy was performed.  The bladder was  carefully inspected and was free of any tumor or stones.  Both ureteral  orifices were of normal configuration and location.  Hydrodistention of the  bladder was performed.  The bladder was filled to a pressure of 100 cm of  water for five minutes.  When the bladder was drained, there were no  glomerulations seen within the bladder consistent with interstitial  cystitis, and bladder capacity was diminished at 600 cc, which compares to a  normal bladder capacity of 1,150, and  average for interstitial cystitis of  575.  The patient's bladder was drained.  She was sent home with Tequin,  Pyridium Plus, and Lorcet Plus, to return to the office to see me in  followup.      RJE/MEDQ  D:  09/20/2004  T:  09/21/2004  Job:  [id number]

## 2011-05-18 NOTE — Discharge Summary (Signed)
Dana Lozano, Dana Lozano               ACCOUNT NO.:  [id number]   MEDICAL RECORD NO.:  [id number]          PATIENT TYPE:  INP   LOCATION:  0369                         FACILITY:  [facility]   PHYSICIAN:  Judie Petit T. Russella Dar, M.D. [facility] OF BIRTH:  21-Dec-1981   DATE OF ADMISSION:  03/01/2005  DATE OF DISCHARGE:  03/04/2005                                 DISCHARGE SUMMARY   ADMISSION DIAGNOSIS:  16.  30 year old female with six day history of diarrhea, abdominal cramping,      nausea, and poor p.o. intake, suspect infectious colitis, rule out C.      difficile.  2.  History of interstitial cystitis.  3.  Migraine headaches.  4.  History of ureteral lithiasis.  5.  Prior history of Lyme disease, treated.   DISCHARGE DIAGNOSIS:  1.  Resolving infectious gastroenteritis.  2.  Dehydration, mild, resolved.  3.  Underlying irritable bowel syndrome.  4.  Other diagnoses as listed above.   CONSULTATIONS:  None.   PROCEDURE:  None.   BRIEF HISTORY:  Dana Lozano is a pleasant 30 year old white female new to GI on  the day of admission who was referred by Dr. Logan Bores for her acute symptoms.  She does have a history of interstitial cystitis for which she is followed  by Dr. Logan Bores.  She states she was most recently treated for urinary tract  infection in the emergency room on February 08, 2005, with Bactrim x 7 days.  About six days ago, she developed abdominal cramping, diarrhea, nausea,  chills, and sweats.  She has not had any fever that she has been aware of,  but has been having 10-15 bowel movements per day over the past couple of  days with small amounts of bright red blood on the tissue.  She has been  unable to keep down much p.o. and any p.o. intake is tending to aggravate  her diarrhea.  At this time, she has become progressively weak and dizzy,  apparently had a syncopal episode coming off the commode earlier this  morning.  She was seen in the office by Dr. Russella Dar, felt to be acutely ill  and  dehydrated, and is admitted for supportive management for probable  infectious colitis, rule out C. difficile.   LABORATORY DATA:  On admission, March 01, 2005, WBC 6, hemoglobin 12.5,  hematocrit 36.3, MCV 86, platelets 361.  Follow up on March 4, WBC 6.1,  hemoglobin 11.7, hematocrit 34.6, MCV 87, sed rate 19.  Protime 12.7, INR  0.9.  Electrolytes normal on admission, glucose 141, BUN 8, creatinine 0.7,  albumin 4, potassium 3.8.  Follow up on March 4 showed glucose 96.  LFTs  normal.  Stool for WBC, none seen.  Stool for C. diff negative. Stool  culture negative.  Stool for O&P negative.   X-RAY STUDIES:  Plain abdominal films were unremarkable.   HOSPITAL COURSE:  The patient was admitted to the service of Dr. Claudette Head.  She was kept at bowel rest, placed on IV fluids rather aggressively  initially, and Demerol and Phenergan as needed for her cramping and  nausea.  Her stool cultures were obtained.  She was continued on her usual  medications and we placed her empirically on oral Flagyl for the possibility  of C. difficile.  After the first 24 hours, she was feeling better, remained  weak and queasy, was still having diarrhea though it had decreased in  frequency.  We advanced her diet to clear liquid which she seemed to  tolerate well.  On March 4, she was still reporting significant diarrhea  with ten bowel movements but the nurses had documented three.  We watched  her another 24 hours, advanced her diet which she tolerated, added an anti-  spasmodic, added empiric Cipro, again, all her cultures returned negative.  By March 04, 2005, she had no further diarrhea, no complaints of abdominal  pain.  She was afebrile and was allowed discharge to home with plans to  follow up with Dr. Russella Dar in the office p.r.n., to follow up with Dr. Shaune Pollack in one week.  It was felt that she probably did have a significant  component of irritable bowel playing into her acute illness.  She  was  discharged on Flagyl 250 q.i.d. for ten days empirically, Cipro 500 b.i.d.  for seven days, Levbid 1 p.o. b.i.d. as needed for cramping and diarrhea,  Elmiron 1 p.o. b.i.d., birth control pill daily, Effexor 75 t.i.d., Imodium  as needed, and FloraQ 1 p.o. daily x 7 days, Phenergan 25 q.6-8h. if needed  for nausea and vomiting.  She was to follow a low roughage, low fat diet  over the next week, and then advance as tolerated.   CONDITION ON DISCHARGE:  Stable and improved.      AE/MEDQ  D:  04/18/2005  T:  04/18/2005  Job:  [id number]   cc:   Duncan Dull, M.D.  [address]  [place]  [place] [address]  Fax: [phone]

## 2011-06-24 IMAGING — CT CT ABD-PELV W/ CM
2 of 4 series · 17 of 46 positions shown, 19 images · IV contrast (agent unspecified)
Comparison: 09/25/2006

CLINICAL DATA: Right abdominal pain.  Nausea vomiting and
constipation.

CT ABDOMEN AND PELVIS WITH CONTRAST
TECHNIQUE: Multidetector CT imaging of the abdomen and pelvis was
performed following the standard protocol during bolus
administration of intravenous contrast.
Contrast: 100 ml of omni 300

[Series 2: rtn ap with st · axial · 0.62mm/px · z∈[+640,+1036]mm · 14 of 87 slices shown, 16 images]
[im 4/87  soft-tissue]
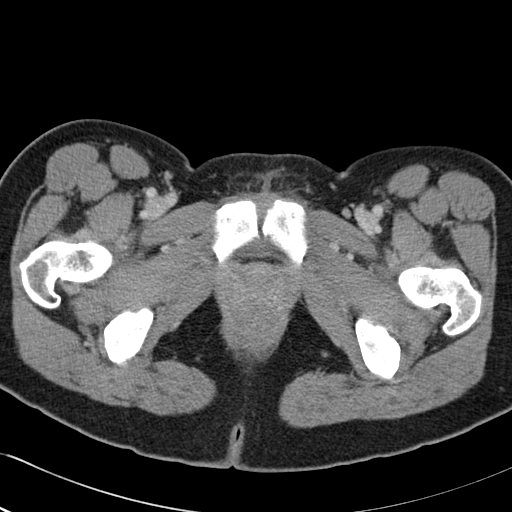
[im 4/87  bone]
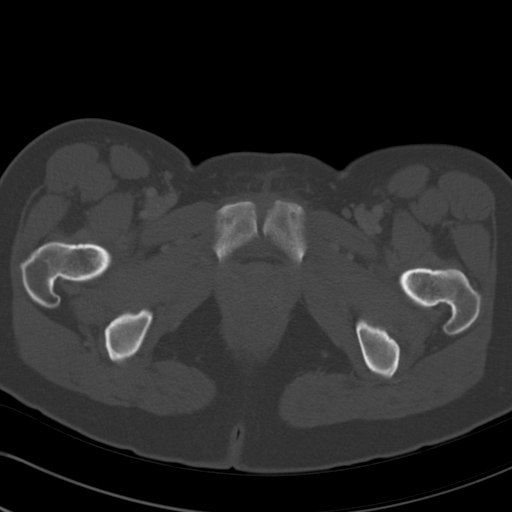
[im 10/87  soft-tissue]
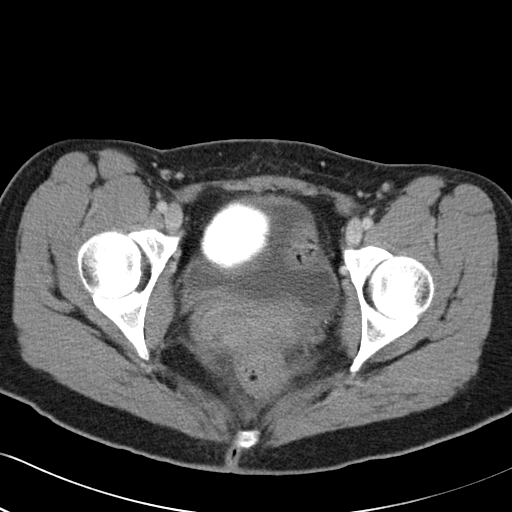
[im 17/87  soft-tissue]
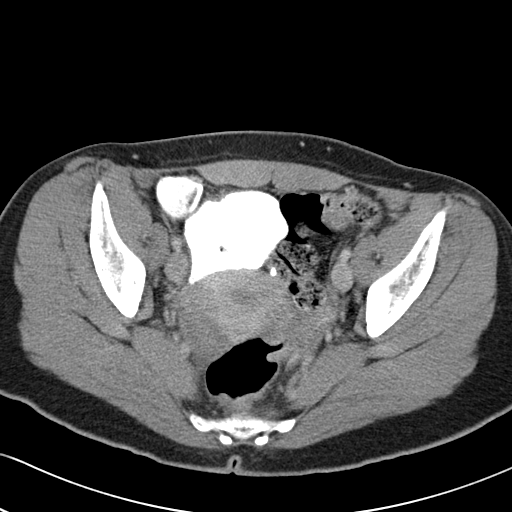
[im 24/87  soft-tissue]
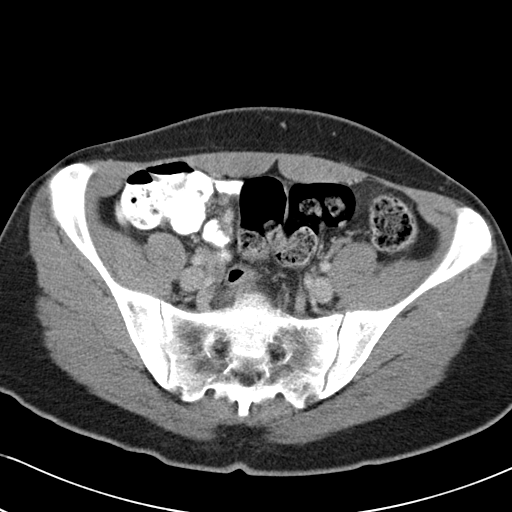
[im 30/87  soft-tissue]
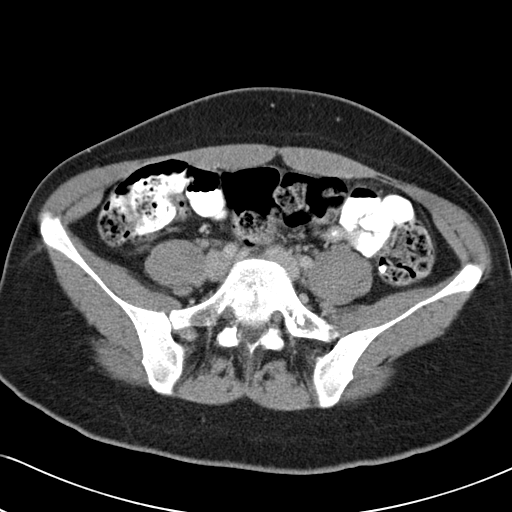
[im 34/87  soft-tissue]
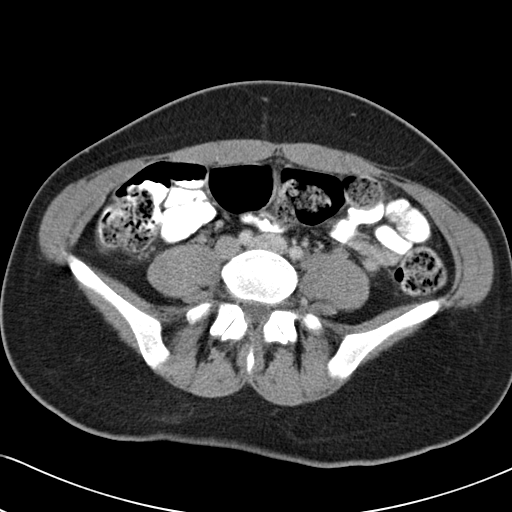
[im 40/87  soft-tissue]
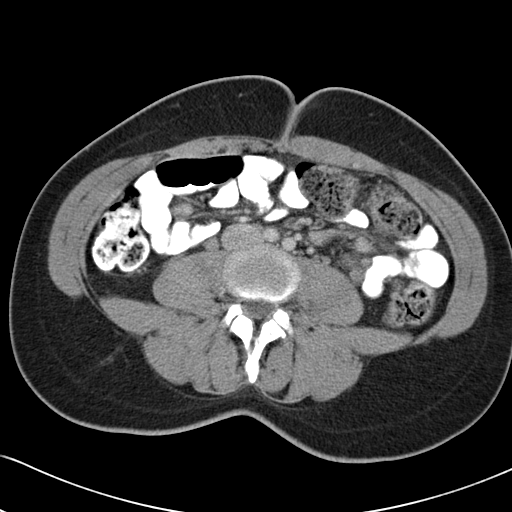
[im 47/87  soft-tissue]
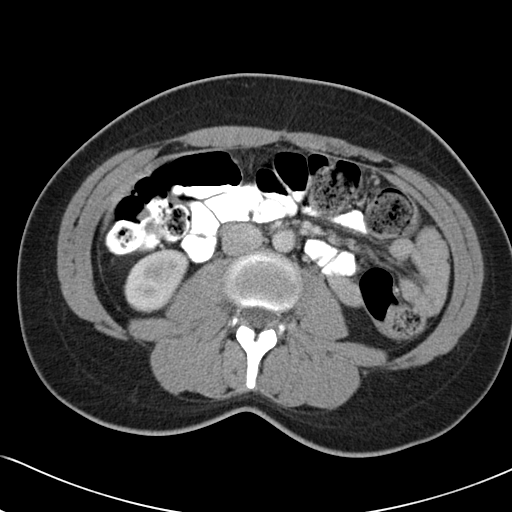
[im 53/87  soft-tissue]
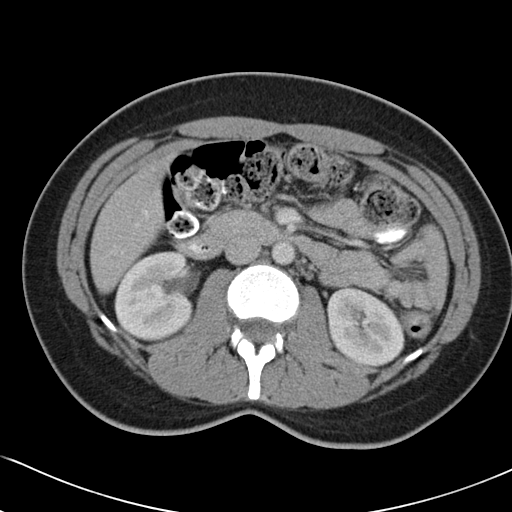
[im 53/87  bone]
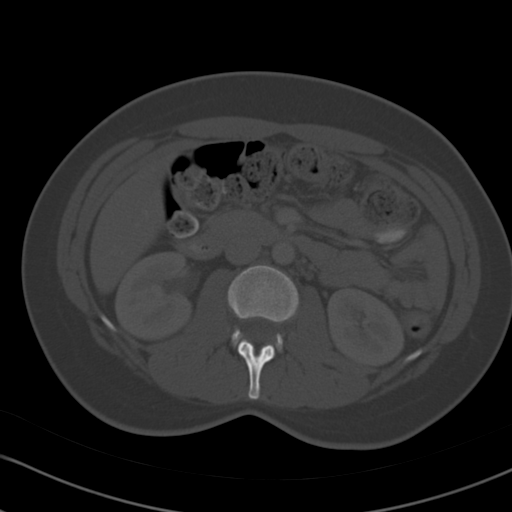
[im 57/87  soft-tissue]
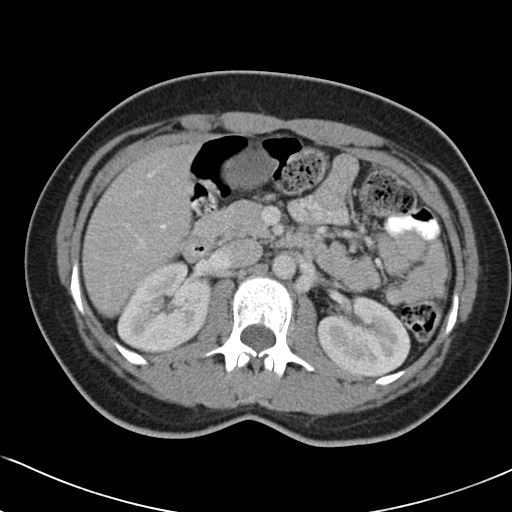
[im 63/87  soft-tissue]
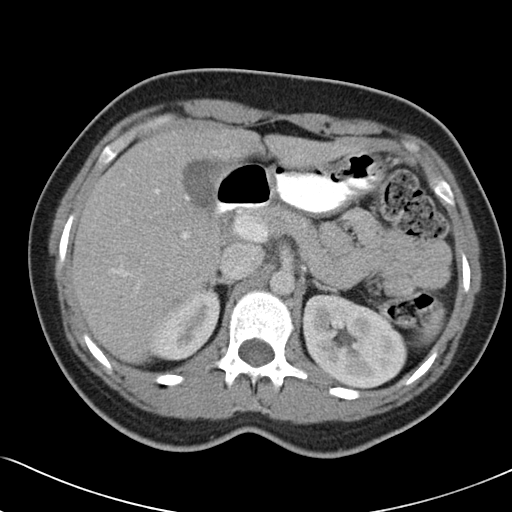
[im 70/87  soft-tissue]
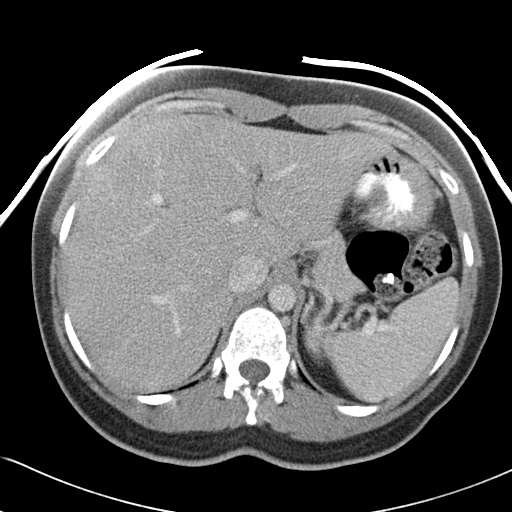
[im 77/87  soft-tissue]
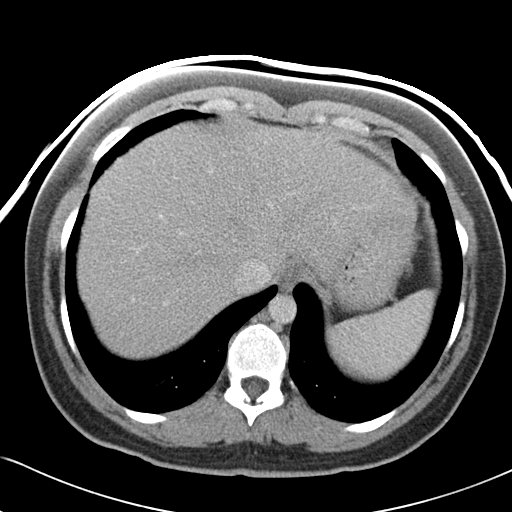
[im 83/87  soft-tissue]
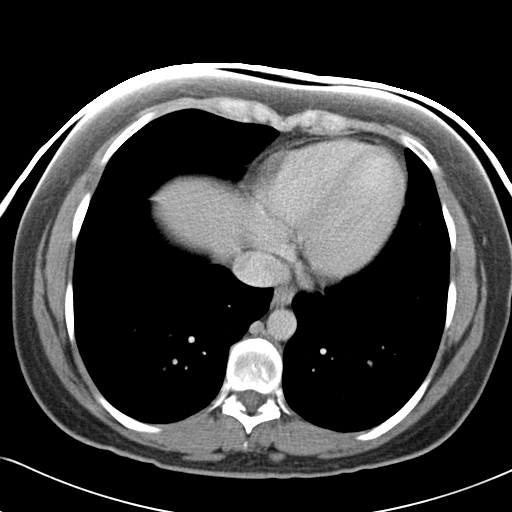

[Series 602: <mpr thick range> · coronal · 0.88mm/px · 3 of 71 slices shown]
[im 24/71  soft-tissue]
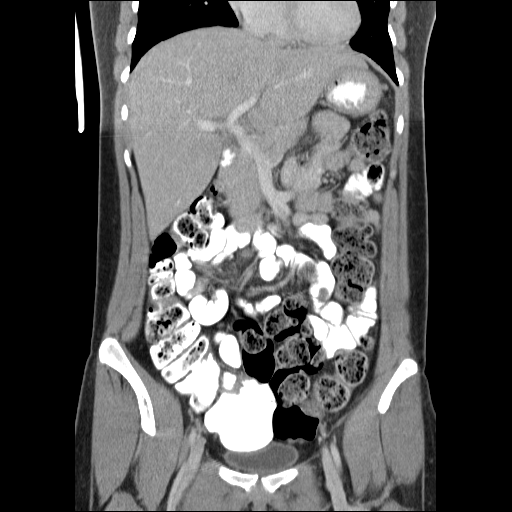
[im 32/71  soft-tissue]
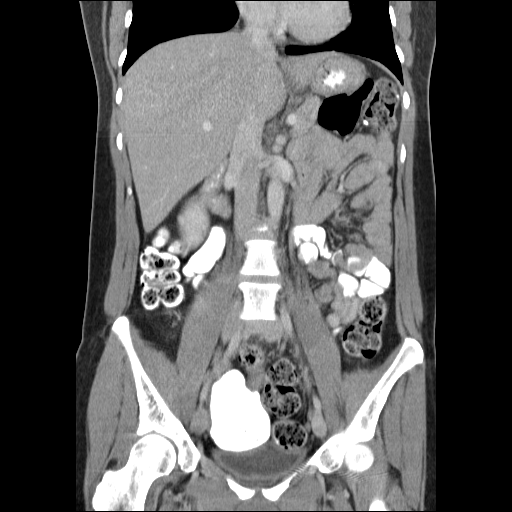
[im 39/71  soft-tissue]
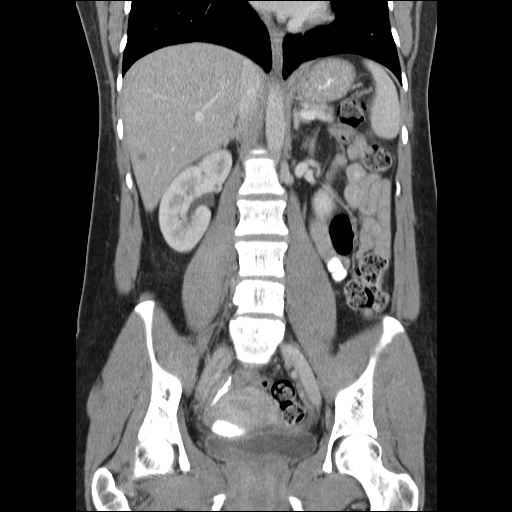

[17 of 46 positions shown; findings below may reference images not displayed]

FINDINGS: Visualized lung bases are clear.

Within the right hepatic lobe there is a small hypodensity
measuring 7 mm, image 27.

This is unchanged from previous exam and is likely benign.  Within
the anterior right hepatic lobe there is a stable 1.4 mm
hypodensity, image 19.

Spleen is normal.

Pancreas is normal.

The adrenal glands are normal.

 The kidneys are normal.

Gallbladder is unremarkable by CT.

No biliary ductal dilation.

Stomach and visualized large and small bowel are unremarkable.

Abdominal aorta normal is in caliber.

No significant lymphadenopathy.

No free fluid or abnormal fluid collections.

The a small amount of free fluid is identified within the pelvis.

The uterus appears normal.

No mass identified.

The appendix is normal.
IMPRESSION: 1.  Small amount of free fluid within the pelvis is nonspecific and
may be physiologic.
2.  Stable hypodensities within the liver parenchyma which are
likely benign.

## 2011-07-02 ENCOUNTER — Other Ambulatory Visit: Payer: Self-pay | Admitting: Obstetrics & Gynecology

## 2011-09-11 ENCOUNTER — Ambulatory Visit (HOSPITAL_COMMUNITY): Payer: PRIVATE HEALTH INSURANCE | Attending: Internal Medicine

## 2011-09-11 DIAGNOSIS — A6920 Lyme disease, unspecified: Secondary | ICD-10-CM | POA: Insufficient documentation

## 2011-09-18 ENCOUNTER — Ambulatory Visit (HOSPITAL_COMMUNITY): Payer: PRIVATE HEALTH INSURANCE | Attending: Internal Medicine

## 2011-09-18 DIAGNOSIS — A6920 Lyme disease, unspecified: Secondary | ICD-10-CM | POA: Insufficient documentation

## 2011-09-28 LAB — POCT I-STAT 4, (NA,K, GLUC, HGB,HCT)
Glucose, Bld: 135 — ABNORMAL HIGH
HCT: 39
Hemoglobin: 13.3

## 2011-10-30 ENCOUNTER — Other Ambulatory Visit: Payer: Self-pay | Admitting: Dermatology

## 2012-10-02 ENCOUNTER — Other Ambulatory Visit: Payer: Self-pay | Admitting: Neurosurgery

## 2012-10-02 DIAGNOSIS — M5412 Radiculopathy, cervical region: Secondary | ICD-10-CM

## 2012-10-06 ENCOUNTER — Ambulatory Visit
Admission: RE | Admit: 2012-10-06 | Discharge: 2012-10-06 | Disposition: A | Discharge status: Home / Self Care | Payer: PRIVATE HEALTH INSURANCE | Source: Ambulatory Visit | Attending: Neurosurgery | Admitting: Neurosurgery

## 2012-10-06 DIAGNOSIS — M5412 Radiculopathy, cervical region: Secondary | ICD-10-CM

## 2012-10-10 ENCOUNTER — Other Ambulatory Visit: Payer: Self-pay | Admitting: Neurosurgery

## 2012-10-16 ENCOUNTER — Inpatient Hospital Stay (HOSPITAL_COMMUNITY): Admission: RE | Admit: 2012-10-16 | Payer: PRIVATE HEALTH INSURANCE | Source: Ambulatory Visit

## 2012-10-21 ENCOUNTER — Ambulatory Visit: Admit: 2012-10-21 | Payer: Self-pay | Admitting: Neurosurgery

## 2012-10-21 SURGERY — EXCISION, LESION, SCALP
Anesthesia: Moderate Sedation

## 2012-10-30 ENCOUNTER — Encounter (HOSPITAL_COMMUNITY): Payer: Self-pay | Admitting: *Deleted

## 2012-10-30 ENCOUNTER — Emergency Department (HOSPITAL_COMMUNITY)
Admission: EM | Admit: 2012-10-30 | Discharge: 2012-10-31 | Disposition: A | Discharge status: Home / Self Care | Payer: PRIVATE HEALTH INSURANCE | Attending: Emergency Medicine | Admitting: Emergency Medicine

## 2012-10-30 DIAGNOSIS — Z79899 Other long term (current) drug therapy: Secondary | ICD-10-CM | POA: Insufficient documentation

## 2012-10-30 DIAGNOSIS — N3010 Interstitial cystitis (chronic) without hematuria: Secondary | ICD-10-CM | POA: Insufficient documentation

## 2012-10-30 DIAGNOSIS — R197 Diarrhea, unspecified: Secondary | ICD-10-CM | POA: Insufficient documentation

## 2012-10-30 DIAGNOSIS — IMO0001 Reserved for inherently not codable concepts without codable children: Secondary | ICD-10-CM | POA: Insufficient documentation

## 2012-10-30 DIAGNOSIS — K219 Gastro-esophageal reflux disease without esophagitis: Secondary | ICD-10-CM | POA: Insufficient documentation

## 2012-10-30 DIAGNOSIS — K529 Noninfective gastroenteritis and colitis, unspecified: Secondary | ICD-10-CM

## 2012-10-30 DIAGNOSIS — A6920 Lyme disease, unspecified: Secondary | ICD-10-CM | POA: Insufficient documentation

## 2012-10-30 DIAGNOSIS — K5289 Other specified noninfective gastroenteritis and colitis: Secondary | ICD-10-CM | POA: Insufficient documentation

## 2012-10-30 HISTORY — DX: Other intestinal malabsorption: K90.89

## 2012-10-30 HISTORY — DX: Fibromyalgia: M79.7

## 2012-10-30 HISTORY — DX: Gastro-esophageal reflux disease with esophagitis, without bleeding: K21.00

## 2012-10-30 HISTORY — DX: Gastro-esophageal reflux disease with esophagitis: K21.0

## 2012-10-30 HISTORY — DX: Lyme disease, unspecified: A69.20

## 2012-10-30 HISTORY — DX: Gastro-esophageal reflux disease without esophagitis: K21.9

## 2012-10-30 HISTORY — DX: Interstitial cystitis (chronic) without hematuria: N30.10

## 2012-10-30 NOTE — ED Notes (Signed)
Pt states last night she started having abd pain and then this am pt with n/v and diarrhea. Pt is unable to keep anything down. Pt states she has had 25 episodes of diarrhea and about 5 episodes of emesis today.

## 2012-10-31 LAB — URINE MICROSCOPIC-ADD ON

## 2012-10-31 LAB — PREGNANCY, URINE: Preg Test, Ur: NEGATIVE

## 2012-10-31 LAB — URINALYSIS, ROUTINE W REFLEX MICROSCOPIC
Glucose, UA: NEGATIVE mg/dL
Specific Gravity, Urine: 1.014 (ref 1.005–1.030)
pH: 6.5 (ref 5.0–8.0)

## 2012-10-31 LAB — BASIC METABOLIC PANEL
CO2: 20 mEq/L (ref 19–32)
Chloride: 98 mEq/L (ref 96–112)
Glucose, Bld: 102 mg/dL — ABNORMAL HIGH (ref 70–99)
Sodium: 132 mEq/L — ABNORMAL LOW (ref 135–145)

## 2012-10-31 MED ORDER — ONDANSETRON HCL 4 MG/2ML IJ SOLN
4.0000 mg | Freq: Once | INTRAMUSCULAR | Status: AC
Start: 2012-10-31 — End: 2012-10-31
  Administered 2012-10-31: 4 mg via INTRAVENOUS
  Filled 2012-10-31: qty 2

## 2012-10-31 MED ORDER — HYDROMORPHONE HCL PF 1 MG/ML IJ SOLN
1.0000 mg | Freq: Once | INTRAMUSCULAR | Status: AC
  Administered 2012-10-31: 1 mg via INTRAVENOUS
  Filled 2012-10-31: qty 1

## 2012-10-31 MED ORDER — ONDANSETRON 4 MG PO TBDP: 4.0000 mg | ORAL_TABLET | Freq: Three times a day (TID) | ORAL | Status: DC | PRN

## 2012-10-31 MED ORDER — OXYCODONE-ACETAMINOPHEN 5-325 MG PO TABS: 1.0000 | ORAL_TABLET | ORAL | Status: DC | PRN

## 2012-10-31 MED ORDER — PROMETHAZINE HCL 25 MG/ML IJ SOLN
25.0000 mg | Freq: Once | INTRAMUSCULAR | Status: AC
  Administered 2012-10-31: 25 mg via INTRAVENOUS
  Filled 2012-10-31: qty 1

## 2012-10-31 MED ORDER — SODIUM CHLORIDE 0.9 % IV BOLUS (SEPSIS)
2000.0000 mL | Freq: Once | INTRAVENOUS | Status: AC
  Administered 2012-10-31: 2000 mL via INTRAVENOUS

## 2012-10-31 MED ORDER — PROMETHAZINE HCL 25 MG PO TABS: 25.0000 mg | ORAL_TABLET | Freq: Four times a day (QID) | ORAL | Status: DC | PRN

## 2012-10-31 MED ORDER — HYDROCODONE-ACETAMINOPHEN 5-500 MG PO TABS: 1.0000 | ORAL_TABLET | Freq: Four times a day (QID) | ORAL | Status: DC | PRN

## 2012-10-31 NOTE — ED Provider Notes (Signed)
History     CSN: [id number]  Arrival date & time 10/30/12  2345   First MD Initiated Contact with Patient 10/31/12 0020      Chief Complaint  Patient presents with  . Abdominal Pain  . Diarrhea  . Nausea  . Emesis    (Consider location/radiation/quality/duration/timing/severity/associated sxs/prior treatment) HPI Comments: 31 y/o female with hx of chronic lyme disease and IC who presents with L sided abd pain that started 24 hours ago - gradual in onset, persistent, gradually improving but associated with > 12 episodes of vomiting today and > 10 episodes of watery non bloody diarrhea.  She has no known sick contacts.  Sx are moderate to severe and she has been unable to hold down PO today.    Patient is a 31 y.o. female presenting with abdominal pain, diarrhea, and vomiting. The history is provided by the patient and a parent.  Abdominal Pain The primary symptoms of the illness include abdominal pain, nausea, vomiting and diarrhea. The primary symptoms of the illness do not include fever, shortness of breath or dysuria.  Symptoms associated with the illness do not include chills, frequency or back pain.  Diarrhea The primary symptoms include abdominal pain, nausea, vomiting and diarrhea. Primary symptoms do not include fever, dysuria or rash.  The illness does not include chills or back pain.  Emesis  Associated symptoms include abdominal pain and diarrhea. Pertinent negatives include no chills, no cough, no fever and no headaches.    Past Medical History  Diagnosis Date  . Lyme disease   . Interstitial cystitis   . Bile salt-induced diarrhea   . GERD (gastroesophageal reflux disease)   . Bile reflux esophagitis   . Fibromyalgia     Past Surgical History  Procedure Date  . Cholecystectomy   . Port-a-cath removal   . Sinusotomy   . Exploratory laparotomy     No family history on file.  History  Substance Use Topics  . Smoking status: Never Smoker   . Smokeless  tobacco: Never Used  . Alcohol Use: No    OB History    Grav Para Term Preterm Abortions TAB SAB Ect Mult Living                  Review of Systems  Constitutional: Negative for fever and chills.  HENT: Negative for sore throat and neck pain.   Eyes: Negative for visual disturbance.  Respiratory: Negative for cough and shortness of breath.   Cardiovascular: Negative for chest pain.  Gastrointestinal: Positive for nausea, vomiting, abdominal pain and diarrhea.  Genitourinary: Negative for dysuria and frequency.  Musculoskeletal: Negative for back pain.  Skin: Negative for rash.  Neurological: Negative for weakness, numbness and headaches.  Hematological: Negative for adenopathy.  Psychiatric/Behavioral: Negative for behavioral problems.    Allergies  Doxycycline and Ibuprofen  Home Medications   Current Outpatient Rx  Name Route Sig Dispense Refill  . BUPROPION HCL ER (XL) 300 MG PO TB24 Oral Take 300 mg by mouth every morning.    Marland Kitchen CLONAZEPAM 1 MG PO TABS Oral Take 3 mg by mouth every evening.    . COLESEVELAM HCL 625 MG PO TABS Oral Take 1,250 mg by mouth 2 (two) times daily with a meal.    . DEXTROAMPHETAMINE SULFATE ER 15 MG PO CP24 Oral Take 30 mg by mouth as needed. Narcolepsy and ADHD    . DIAZEPAM 5 MG PO TABS Oral Take 5-10 mg by mouth every 6 (six) hours  as needed. Seizures    . DIPHENOXYLATE-ATROPINE 2.5-0.025 MG PO TABS Oral Take 2 tablets by mouth 4 (four) times daily as needed. IBS    . ESOMEPRAZOLE MAGNESIUM 40 MG PO CPDR Oral Take 40 mg by mouth 2 (two) times daily.    Marland Kitchen HYDROCODONE-ACETAMINOPHEN 10-325 MG PO TABS Oral Take 1 tablet by mouth every 6 (six) hours as needed. Pain    . HYDROXYZINE HCL 25 MG PO TABS Oral Take 25 mg by mouth every evening.    . DEPLIN 15 MG PO TABS Oral Take 15 mg by mouth daily.    Marland Kitchen LAMOTRIGINE 150 MG PO TABS Oral Take 75-300 mg by mouth daily. 75mg  in the am and 300 in the pm    . LEVONORGEST-ETH ESTRAD 91-DAY 0.15-0.03 MG PO  TABS Oral Take 1 tablet by mouth daily.    Marland Kitchen OXCARBAZEPINE 150 MG PO TABS Oral Take 75-300 mg by mouth 2 (two) times daily. 75 am and 300 at pm    . PENTOSAN POLYSULFATE SODIUM 100 MG PO CAPS Oral Take 200 mg by mouth 2 (two) times daily.    Marland Kitchen PREGABALIN 25 MG PO CAPS Oral Take 25 mg by mouth every evening.    Marland Kitchen PROMETHAZINE HCL 25 MG PO TABS Oral Take 25-50 mg by mouth every 6 (six) hours as needed. Nausea    . SUCRALFATE 1 GM/10ML PO SUSP Oral Take 1 g by mouth 3 (three) times daily after meals.    . VENLAFAXINE HCL ER 75 MG PO CP24 Oral Take 225 mg by mouth daily.    Marland Kitchen HYDROCODONE-ACETAMINOPHEN 5-500 MG PO TABS Oral Take 1-2 tablets by mouth every 6 (six) hours as needed for pain. 15 tablet 0  . ONDANSETRON 4 MG PO TBDP Oral Take 1 tablet (4 mg total) by mouth every 8 (eight) hours as needed for nausea. 10 tablet 0  . PROMETHAZINE HCL 25 MG PO TABS Oral Take 1 tablet (25 mg total) by mouth every 6 (six) hours as needed for nausea. 12 tablet 0    BP 106/69  Pulse 122  Temp 98.5 F (36.9 C) (Oral)  Resp 18  Ht 5\' 8"  (1.727 m)  Wt 180 lb (81.647 kg)  BMI 27.37 kg/m2  SpO2 100%  LMP 09/29/2012  Physical Exam  Nursing note and vitals reviewed. Constitutional: She appears well-developed and well-nourished.       Uncomfortable appearing  HENT:  Head: Normocephalic and atraumatic.  Mouth/Throat: No oropharyngeal exudate.       MM dehdyarted  Eyes: Conjunctivae normal and EOM are normal. Pupils are equal, round, and reactive to light. Right eye exhibits no discharge. Left eye exhibits no discharge. No scleral icterus.  Neck: Normal range of motion. Neck supple. No JVD present. No thyromegaly present.  Cardiovascular: Regular rhythm, normal heart sounds and intact distal pulses.  Exam reveals no gallop and no friction rub.   No murmur heard.      tachycardic  Pulmonary/Chest: Effort normal and breath sounds normal. No respiratory distress. She has no wheezes. She has no rales.    Abdominal: Soft. Bowel sounds are normal. She exhibits no distension and no mass. There is tenderness ( L sided abd ttp and LLQ ttp, no guarding, no peritoneal signs, no SP or RLQ or RUQ ttp). There is no rebound and no guarding.  Musculoskeletal: Normal range of motion. She exhibits no edema and no tenderness.  Lymphadenopathy:    She has no cervical adenopathy.  Neurological: She is  alert. Coordination normal.  Skin: Skin is warm and dry. Rash ( has chronic rash from Lyme disease) noted. No erythema.  Psychiatric: She has a normal mood and affect. Her behavior is normal.    ED Course  Procedures (including critical care time)  Labs Reviewed  BASIC METABOLIC PANEL - Abnormal; Notable for the following:    Sodium 132 (*)     Potassium 3.4 (*)     Glucose, Bld 102 (*)     BUN 5 (*)     All other components within normal limits  URINALYSIS, ROUTINE W REFLEX MICROSCOPIC - Abnormal; Notable for the following:    APPearance CLOUDY (*)     Leukocytes, UA TRACE (*)     All other components within normal limits  URINE MICROSCOPIC-ADD ON - Abnormal; Notable for the following:    Squamous Epithelial / LPF FEW (*)     All other components within normal limits  PREGNANCY, URINE  LAB REPORT - SCANNED   No results found.   1. Gastroenteritis       MDM  Tachyc, dehydrated, likely gastroenteritis - has no fever and no hypotension but does have tachycardia and is dehdyartedm, check UA, renal function, symptomatic meds.  Results of laboratory studies show urinalysis with no ketonuria, no infection, negative pregnancy and only a slight hyponatremia and hypokalemia. The patient was significantly with supportive care living IV fluid bolus, hydromorphone and Zofran and Phenergan. Prescriptions given for outpatient treatment of symptoms.      Vida Roller, MD 11/01/12 [id number]

## 2013-01-16 ENCOUNTER — Ambulatory Visit (INDEPENDENT_AMBULATORY_CARE_PROVIDER_SITE_OTHER): Payer: BC Managed Care – PPO | Admitting: Family Medicine

## 2013-01-16 VITALS — BP 126/76 | HR 84 | Temp 97.9°F | Resp 18 | Ht 67.0 in | Wt 190.0 lb

## 2013-01-16 DIAGNOSIS — R531 Weakness: Secondary | ICD-10-CM

## 2013-01-16 DIAGNOSIS — R5381 Other malaise: Secondary | ICD-10-CM

## 2013-01-16 DIAGNOSIS — R790 Abnormal level of blood mineral: Secondary | ICD-10-CM

## 2013-01-16 DIAGNOSIS — R799 Abnormal finding of blood chemistry, unspecified: Secondary | ICD-10-CM

## 2013-01-16 DIAGNOSIS — D649 Anemia, unspecified: Secondary | ICD-10-CM

## 2013-01-16 LAB — POCT CBC
Granulocyte percent: 56.2 %G (ref 37–80)
HCT, POC: 40.4 % (ref 37.7–47.9)
Hemoglobin: 12.1 g/dL — AB (ref 12.2–16.2)
POC Granulocyte: 6.5 (ref 2–6.9)
POC LYMPH PERCENT: 38.5 %L (ref 10–50)
RBC: 4.58 M/uL (ref 4.04–5.48)
RDW, POC: 15.6 %

## 2013-01-16 LAB — GLUCOSE, POCT (MANUAL RESULT ENTRY): POC Glucose: 69 mg/dl — AB (ref 70–99)

## 2013-01-16 NOTE — Progress Notes (Signed)
[facility] [address] [place] [address] [id number] [phone]  Date:  01/16/2013   Name:  Dana Lozano   DOB:  Feb 08, 1981   MRN:  [id number]  PCP:  No primary provider on file.    Chief Complaint: Dizziness and Medication Refill   History of Present Illness:  Dana Lozano is a 32 y.o. very pleasant female patient who presents with the following:  Here today to discuss low ferritin from labs that were done at a "specialty clinic" in [place] in December.  She was being treated for chronic lyme disease by this doctor.  She was told that she needed "several weeks of IV iron" that should be done here in [place].  She would like to be referred to hematology.    They also given cortef due to low cortisol.    She states that her "energy level is bottoming out" and she is nearly passing out when she tries to stand up.  "Just getting out of bed is hell."  She has not actually had LOC.  She had been seeing a doctor at [facility].  However, it seems they were told there "was nothing left for them to offer" so they began to seek care from this clinic in [place].    Anusha has been struggling with several chronic illness for several years including "chronic lyme" disease, FBM and interstitial cystitis.    She is on extended regimen OCP and denies any possibility of pregnancy  Patient Active Problem List  Diagnosis  . LYME DISEASE  . HYPERLIPIDEMIA  . ANXIETY  . DEPRESSION  . MIGRAINE HEADACHE  . HEMORRHOIDS, EXTERNAL  . ALLERGIC RHINITIS, SEASONAL  . ASTHMA  . ESOPHAGITIS  . GERD  . IRRITABLE BOWEL SYNDROME  . INTERSTITIAL CYSTITIS  . ARTHRITIS  . FIBROMYALGIA  . NAUSEA AND VOMITING  . DIARRHEA  . NEPHROLITHIASIS, HX OF    Past Medical History  Diagnosis Date  . Lyme disease   . Interstitial cystitis   . Bile salt-induced diarrhea   . GERD (gastroesophageal reflux disease)   . Bile reflux esophagitis   . Fibromyalgia     Past Surgical History    Procedure Date  . Cholecystectomy   . Port-a-cath removal   . Sinusotomy   . Exploratory laparotomy     History  Substance Use Topics  . Smoking status: Former Smoker    Quit date: 01/16/2003  . Smokeless tobacco: Never Used  . Alcohol Use: No    No family history on file.  Allergies  Allergen Reactions  . Doxycycline Nausea And Vomiting  . Ibuprofen Swelling    Medication list has been reviewed and updated.  Current Outpatient Prescriptions on File Prior to Visit  Medication Sig Dispense Refill  . buPROPion (WELLBUTRIN XL) 300 MG 24 hr tablet Take 300 mg by mouth every morning.      . clonazePAM (KLONOPIN) 1 MG tablet Take 3 mg by mouth every evening.      . colesevelam (WELCHOL) 625 MG tablet Take 1,250 mg by mouth 2 (two) times daily with a meal.      . dextroamphetamine (DEXEDRINE SPANSULE) 15 MG 24 hr capsule Take 30 mg by mouth as needed. Narcolepsy and ADHD      . diazepam (VALIUM) 5 MG tablet Take 5-10 mg by mouth every 6 (six) hours as needed. Seizures      . diphenoxylate-atropine (LOMOTIL) 2.5-0.025 MG per tablet Take 2 tablets by mouth 4 (  four) times daily as needed. IBS      . esomeprazole (NEXIUM) 40 MG capsule Take 40 mg by mouth 2 (two) times daily.      Marland Kitchen HYDROcodone-acetaminophen (NORCO) 10-325 MG per tablet Take 1 tablet by mouth every 6 (six) hours as needed. Pain      . hydrOXYzine (ATARAX/VISTARIL) 25 MG tablet Take 25 mg by mouth every evening.      Marland Kitchen L-Methylfolate (DEPLIN) 15 MG TABS Take 15 mg by mouth daily.      Marland Kitchen lamoTRIgine (LAMICTAL) 150 MG tablet Take 75-300 mg by mouth daily. 75mg  in the am and 300 in the pm      . levonorgestrel-ethinyl estradiol (SEASONALE) 0.15-0.03 MG tablet Take 1 tablet by mouth daily.      . OXcarbazepine (TRILEPTAL) 150 MG tablet Take 75-300 mg by mouth 2 (two) times daily. 75 am and 300 at pm      . pentosan polysulfate (ELMIRON) 100 MG capsule Take 200 mg by mouth 2 (two) times daily.      . pregabalin (LYRICA) 25  MG capsule Take 25 mg by mouth every evening.      . promethazine (PHENERGAN) 25 MG tablet Take 25-50 mg by mouth every 6 (six) hours as needed. Nausea      . promethazine (PHENERGAN) 25 MG tablet Take 1 tablet (25 mg total) by mouth every 6 (six) hours as needed for nausea.  12 tablet  0  . sucralfate (CARAFATE) 1 GM/10ML suspension Take 1 g by mouth 3 (three) times daily after meals.      . venlafaxine XR (EFFEXOR-XR) 75 MG 24 hr capsule Take 225 mg by mouth daily.      Marland Kitchen HYDROcodone-acetaminophen (VICODIN) 5-500 MG per tablet Take 1-2 tablets by mouth every 6 (six) hours as needed for pain.  15 tablet  0  . ondansetron (ZOFRAN ODT) 4 MG disintegrating tablet Take 1 tablet (4 mg total) by mouth every 8 (eight) hours as needed for nausea.  10 tablet  0    Review of Systems:  As per HPI- otherwise negative.   Physical Examination: Filed Vitals:   01/16/13 1706  BP: 126/76  Pulse: 84  Temp: 97.9 F (36.6 C)  Resp: 18   Filed Vitals:   01/16/13 1706  Height: 5\' 7"  (1.702 m)  Weight: 190 lb (86.183 kg)   Body mass index is 29.76 kg/(m^2). Ideal Body Weight: Weight in (lb) to have BMI = 25: 159.3   GEN: WDWN, NAD, Non-toxic, A & O x 3,overweight HEENT: Atraumatic, Normocephalic. Neck supple. No masses, No LAD. Bilateral TM wnl, oropharynx normal.  PEERL,EOMI.   Ears and Nose: No external deformity. CV: RRR, No M/G/R. No JVD. No thrill. No extra heart sounds. PULM: CTA B, no wheezes, crackles, rhonchi. No retractions. No resp. distress. No accessory muscle use. ABD: S, NT, ND, +BS. No rebound. No HSM. EXTR: No c/c/e NEURO Normal gait.  PSYCH: Normally interactive. Conversant. Not depressed or anxious appearing.  Calm demeanor.   Results for orders placed in visit on 01/16/13  POCT CBC      Component Value Range   WBC 11.5 (*) 4.6 - 10.2 K/uL   Lymph, poc 4.4 (*) 0.6 - 3.4   POC LYMPH PERCENT 38.5  10 - 50 %L   MID (cbc) 0.6  0 - 0.9   POC MID % 5.3  0 - 12 %M   POC  Granulocyte 6.5  2 - 6.9   Granulocyte percent  56.2  37 - 80 %G   RBC 4.58  4.04 - 5.48 M/uL   Hemoglobin 12.1 (*) 12.2 - 16.2 g/dL   HCT, POC 82.9  56.2 - 47.9 %   MCV 88.3  80 - 97 fL   MCH, POC 26.4 (*) 27 - 31.2 pg   MCHC 30.0 (*) 31.8 - 35.4 g/dL   RDW, POC 13.0     Platelet Count, POC 487 (*) 142 - 424 K/uL   MPV 8.9  0 - 99.8 fL  GLUCOSE, POCT (MANUAL RESULT ENTRY)      Component Value Range   POC Glucose 69 (*) 70 - 99 mg/dl   Slight leukocytosis- she is on steroids po Admits that she has not eaten much today.  Given crackers and juice to eat prior to leaving clinic.  Her mother is driving.   Assessment and Plan: 1. Anemia  POCT CBC  2. Weakness  POCT glucose (manual entry)  3. Low ferritin     Preeya has a complex medical history and has been seeing doctors for chronic symptoms for several years.  Today her main goal is to be referred to hematology, which I did.  Although her ferritin is low her MCV is normal, and she is not anemic.  I am not sure if IV iron is the correct choice for her but will defer to hematology.   Estalene's mother is also concerned about her other chronic symptoms including lack of energy and near syncope- it seems these symptoms have been present for a long time.  Explained that if she is so weak she is actually passing out she will need to be seen at the ED.  Latorsha denies passing out and does not wish to be seen at the ED.  She will seek care if worse or if her symptoms change.    Abbe Amsterdam, MD

## 2013-01-16 NOTE — Patient Instructions (Addendum)
We will refer you to hematology for further evaluation of your ferritin problem.  If your symptoms are worse in the meantime please come see [place], [facility] or the ER.

## 2013-01-19 ENCOUNTER — Telehealth: Payer: Self-pay | Admitting: Hematology & Oncology

## 2013-01-19 NOTE — Telephone Encounter (Signed)
Left pt message to call for appointment °

## 2013-01-20 ENCOUNTER — Telehealth: Payer: Self-pay | Admitting: Hematology & Oncology

## 2013-01-20 NOTE — Telephone Encounter (Signed)
Left message for pt to call to schedule appointment. 

## 2013-01-21 ENCOUNTER — Telehealth: Payer: Self-pay | Admitting: Hematology & Oncology

## 2013-01-21 NOTE — Telephone Encounter (Signed)
Pt left message to schedule appointment. I called Pt back left message for her to call and schedule appointment

## 2013-01-21 NOTE — Telephone Encounter (Signed)
Pt aware of 02-05-13 appointment

## 2013-02-05 ENCOUNTER — Ambulatory Visit (HOSPITAL_BASED_OUTPATIENT_CLINIC_OR_DEPARTMENT_OTHER): Payer: BC Managed Care – PPO | Admitting: Hematology & Oncology

## 2013-02-05 ENCOUNTER — Ambulatory Visit: Payer: BC Managed Care – PPO

## 2013-02-05 ENCOUNTER — Other Ambulatory Visit (HOSPITAL_BASED_OUTPATIENT_CLINIC_OR_DEPARTMENT_OTHER): Payer: BC Managed Care – PPO | Admitting: Lab

## 2013-02-05 ENCOUNTER — Encounter: Payer: Self-pay | Admitting: Hematology & Oncology

## 2013-02-05 VITALS — BP 123/77 | HR 82 | Temp 98.4°F | Resp 16 | Ht 67.0 in | Wt 190.0 lb

## 2013-02-05 DIAGNOSIS — E611 Iron deficiency: Secondary | ICD-10-CM

## 2013-02-05 DIAGNOSIS — D509 Iron deficiency anemia, unspecified: Secondary | ICD-10-CM

## 2013-02-05 DIAGNOSIS — A6920 Lyme disease, unspecified: Secondary | ICD-10-CM

## 2013-02-05 DIAGNOSIS — D510 Vitamin B12 deficiency anemia due to intrinsic factor deficiency: Secondary | ICD-10-CM

## 2013-02-05 HISTORY — DX: Iron deficiency anemia, unspecified: D50.9

## 2013-02-05 LAB — CBC WITH DIFFERENTIAL (CANCER CENTER ONLY)
BASO%: 0.3 % (ref 0.0–2.0)
HCT: 35.6 % (ref 34.8–46.6)
LYMPH#: 3.8 10*3/uL — ABNORMAL HIGH (ref 0.9–3.3)
MONO#: 0.6 10*3/uL (ref 0.1–0.9)
NEUT%: 42.4 % (ref 39.6–80.0)
RDW: 14 % (ref 11.1–15.7)
WBC: 8 10*3/uL (ref 3.9–10.0)

## 2013-02-05 LAB — CHCC SATELLITE - SMEAR

## 2013-02-05 LAB — IRON AND TIBC: UIBC: >575 ug/dL — ABNORMAL HIGH (ref 125–400)

## 2013-02-05 LAB — RETICULOCYTES (CHCC): Retic Ct Pct: 1.2 % (ref 0.4–2.3)

## 2013-02-05 NOTE — Progress Notes (Signed)
This office note has been dictated.

## 2013-02-05 NOTE — Addendum Note (Signed)
Addended by: Josph Macho on: 02/05/2013 05:21 PM   Modules accepted: Orders

## 2013-02-06 ENCOUNTER — Other Ambulatory Visit (HOSPITAL_BASED_OUTPATIENT_CLINIC_OR_DEPARTMENT_OTHER): Payer: BC Managed Care – PPO | Admitting: Lab

## 2013-02-06 ENCOUNTER — Ambulatory Visit (HOSPITAL_BASED_OUTPATIENT_CLINIC_OR_DEPARTMENT_OTHER): Payer: BC Managed Care – PPO

## 2013-02-06 VITALS — BP 119/71 | HR 81 | Temp 97.2°F | Resp 20

## 2013-02-06 DIAGNOSIS — D510 Vitamin B12 deficiency anemia due to intrinsic factor deficiency: Secondary | ICD-10-CM

## 2013-02-06 DIAGNOSIS — D509 Iron deficiency anemia, unspecified: Secondary | ICD-10-CM

## 2013-02-06 LAB — VITAMIN B12: Vitamin B-12: 474 pg/mL (ref 211–911)

## 2013-02-06 MED ORDER — SODIUM CHLORIDE 0.9 % IV SOLN
Freq: Once | INTRAVENOUS | Status: AC
  Administered 2013-02-06: 15:00:00 via INTRAVENOUS

## 2013-02-06 MED ORDER — SODIUM CHLORIDE 0.9 % IV SOLN
1020.0000 mg | Freq: Once | INTRAVENOUS | Status: AC
  Administered 2013-02-06: 1020 mg via INTRAVENOUS
  Filled 2013-02-06: qty 34

## 2013-02-06 NOTE — Progress Notes (Signed)
CC:   Dana Cables, MD [place], [place] [phone]  DIAGNOSIS:  Iron deficiency anemia.  HISTORY OF PRESENT ILLNESS:  Dana Lozano is a very nice 32 year old white female.  She has a very extensive past history.  She has chronic Lyme disease.  She has neurologic issues from the Lyme disease.  She sees a specialty Lyme disease clinic up in [place], [place].  They did some lab work up there on her.  They found that her ferritin was incredibly low at 5.  Dana Lozano is feeling very tired.  She was sleeping a lot.  She was chewing some ice.  They referred her back down to [place].  We are now seeing her to try to help with the iron deficiency.  Dana Lozano takes about 30 different medicines.  She is on a lot of supplements.  She is not a vegetarian.  She probably does not absorb iron all that well.  Again, she was seen by the specialty clinic up in [place], [place].  They have been very aggressive in trying to treat her.  Again, she is dealing with chronic Lyme disease.  She does have a Port-A- Cath in because of infusions that she has had in the past.  She has had no rashes.  There has been no obvious melena or bright red blood per rectum.  She does have some GERD.  She does have fibromyalgia. She also has interstitial cystitis.  Again, we were asked to see her.  She was seen by Dr. Patsy Lager at [facility].  PAST MEDICAL HISTORY: 1. Remarkable for Lyme disease. 2. GERD. 3. Esophagitis. 4. Irritable bowel syndrome. 5. Interstitial cystitis. 6. Fibromyalgia. 7. Diarrhea. 8. Asthma. 9. Migraines. 10.Depression/anxiety. 11.Cholecystectomy.  ALLERGIES:  Doxycycline and ibuprofen.  MEDICATIONS: 1. Wellbutrin XL 300 mg p.o. q.a.m. 2. Klonopin 3 mg p.o. q.p.m. 3. Welchol 1250 mg p.o. b.i.d. 4. Dexedrine 30 mg p.o. daily as needed. 5. Valium 5-10 mg p.o. q.6 hours p.r.n. seizures. 6. Lomotil 2 p.o. q.4 hours p.r.n. 7. Nexium 40 mg p.o. b.i.d. 8. Vicodin as  indicated. 9. Methyl folate 15 mg p.o. daily. 10.Lamictal 75 mg p.o. q.a.m. and 300 mg p.o. q.p.m. 11.Seasonale 0.15/0.03 mg (1 p.o. daily). 12.Trileptal 75 mg p.o. q.a.m. and 300 mg p.o. q.p.m. 13.Elmiron 200 mg p.o. b.i.d. 14.Lyrica 25 mg p.o. q.evening. 15.Phenergan 25-50 mg p.o. q.6 hours p.r.n. 16.Carafate 1 g p.o. t.i.d. 17.Effexor XR 225 mg p.o. daily. 18.Zofran 4 mg q.8 hours p.r.n.  SOCIAL HISTORY:  Negative for tobacco use.  There is no alcohol use. She has no obvious occupational exposures.  FAMILY HISTORY:  Noncontributory.  REVIEW OF SYSTEMS:  As stated in history of present illness.  PHYSICAL EXAMINATION:  General:  This is a fairly well-developed, well- nourished white female in no obvious distress.  Vital signs:  Show a temperature of 98.4, pulse 82, respiratory rate 16, blood pressure 122/77.  Weight is 190.  Head and neck:  Shows a normocephalic, atraumatic skull.  There are no ocular or oral lesions.  There are no palpable cervical or supraclavicular lymph nodes.  Lungs:  Clear bilaterally.  Cardiac:  Regular rate and rhythm with a normal S1, S2. There are no murmurs, rubs or bruits.  Abdomen:  Soft with good bowel sounds.  There is no palpable abdominal mass.  There is no fluid wave. There is no palpable hepatosplenomegaly.  Back:  No tenderness over the spine, ribs or hips.  Extremities:  Shows no clubbing, cyanosis or  edema.  She has good range of motion of her joints.  Skin:  Shows no rashes.  Neurological:  Shows no focal neurological deficits.  LABORATORY STUDIES:  White cell count 8, hemoglobin 11.4, hematocrit 35.6, platelet count 394.  MCV is 87.  Peripheral smear does show some microcytic red blood cells.  There are some hypochromic red cells.  I see no target cells.  There are no schistocytes.  I see no polychromasia.  There is no rouleaux formation. White cells appear normal in morphology and maturation.  There are no immature myeloid or lymphoid  forms.  Platelets are adequate in number and size.  IMPRESSION:  Dana Lozano is a very nice 32 year old white female with iron deficiency anemia.  Again, she is on about 25 different medications.  I just do not think she absorbs iron.  A ferritin of 5 is highly indicative of severe iron deficiency.  We will go ahead and give her a dose of IV iron.  We will give her a dose of Feraheme at 1020 mg.  We will do this on February 7.  I will plan to get her back in about 6-7 weeks.  Hopefully we will see a response.  There is a question of her having B12 deficiency.  This I think would be unusual.  When we see her back, we will check a B12 level on her.  I spent a good hour or more with Dana Lozano and her mom.  They are both very, very nice.  I just feel bad for Dana Lozano that she has had a very tough past 10 years or so.    ______________________________ Josph Macho, M.D. PRE/MEDQ  D:  02/05/2013  T:  02/06/2013  Job:  [id number]

## 2013-02-06 NOTE — Patient Instructions (Signed)
Ferumoxytol injection What is this medicine? FERUMOXYTOL is an iron complex. Iron is used to make healthy red blood cells, which carry oxygen and nutrients throughout the body. This medicine is used to treat iron deficiency anemia in people with chronic kidney disease. This medicine may be used for other purposes; ask your health care provider or pharmacist if you have questions. What should I tell my health care provider before I take this medicine? They need to know if you have any of these conditions: -anemia not caused by low iron levels -high levels of iron in the blood -magnetic resonance imaging (MRI) test scheduled -an unusual or allergic reaction to iron, other medicines, foods, dyes, or preservatives -pregnant or trying to get pregnant -breast-feeding How should I use this medicine? This medicine is for infusion into a vein. It is given by a health care professional in a hospital or clinic setting. Talk to your pediatrician regarding the use of this medicine in children. Special care may be needed. Overdosage: If you think you've taken too much of this medicine contact a poison control center or emergency room at once. Overdosage: If you think you have taken too much of this medicine contact a poison control center or emergency room at once. NOTE: This medicine is only for you. Do not share this medicine with others. What if I miss a dose? It is important not to miss your dose. Call your doctor or health care professional if you are unable to keep an appointment. What may interact with this medicine? This medicine may interact with the following medications: -other iron products This list may not describe all possible interactions. Give your health care provider a list of all the medicines, herbs, non-prescription drugs, or dietary supplements you use. Also tell them if you smoke, drink alcohol, or use illegal drugs. Some items may interact with your medicine. What should I watch  for while using this medicine? Visit your doctor or healthcare professional regularly. Tell your doctor or healthcare professional if your symptoms do not start to get better or if they get worse. You may need blood work done while you are taking this medicine. You may need to follow a special diet. Talk to your doctor. Foods that contain iron include: whole grains/cereals, dried fruits, beans, or peas, leafy green vegetables, and organ meats (liver, kidney). What side effects may I notice from receiving this medicine? Side effects that you should report to your doctor or health care professional as soon as possible: -allergic reactions like skin rash, itching or hives, swelling of the face, lips, or tongue -breathing problems -changes in blood pressure -feeling faint or lightheaded, falls -fever or chills -flushing, sweating, or hot feelings -swelling of the ankles or feet Side effects that usually do not require medical attention (Report these to your doctor or health care professional if they continue or are bothersome.): -diarrhea -headache -nausea, vomiting -stomach pain This list may not describe all possible side effects. Call your doctor for medical advice about side effects. You may report side effects to FDA at 1-800-FDA-1088. Where should I keep my medicine? This drug is given in a hospital or clinic and will not be stored at home. NOTE: This sheet is a summary. It may not cover all possible information. If you have questions about this medicine, talk to your doctor, pharmacist, or health care provider.  2012, Elsevier/Gold Standard. (09/08/2008 9:48:25 PM) 

## 2013-02-17 ENCOUNTER — Telehealth: Payer: Self-pay | Admitting: *Deleted

## 2013-02-17 NOTE — Telephone Encounter (Signed)
Message copied by Anselm Jungling on Tue Feb 17, 2013 11:33 AM ------      Message from: Josph Macho      Created: Sun Feb 08, 2013  5:43 PM       Call - B12 is ok!!  Iron was very low!!!  Pete ------

## 2013-02-17 NOTE — Telephone Encounter (Signed)
Called patient to let her know that her Vitamin B12 is ok and  Iron levels were very low per dr. Myna Hidalgo

## 2013-04-10 ENCOUNTER — Other Ambulatory Visit (HOSPITAL_BASED_OUTPATIENT_CLINIC_OR_DEPARTMENT_OTHER): Payer: BC Managed Care – PPO | Admitting: Lab

## 2013-04-10 ENCOUNTER — Ambulatory Visit (HOSPITAL_BASED_OUTPATIENT_CLINIC_OR_DEPARTMENT_OTHER): Payer: BC Managed Care – PPO | Admitting: Medical

## 2013-04-10 VITALS — BP 127/84 | HR 99 | Temp 98.1°F | Resp 16 | Ht 67.0 in | Wt 193.0 lb

## 2013-04-10 DIAGNOSIS — D509 Iron deficiency anemia, unspecified: Secondary | ICD-10-CM

## 2013-04-10 DIAGNOSIS — J984 Other disorders of lung: Secondary | ICD-10-CM

## 2013-04-10 LAB — CBC WITH DIFFERENTIAL (CANCER CENTER ONLY)
BASO#: 0 10*3/uL (ref 0.0–0.2)
BASO%: 0.1 % (ref 0.0–2.0)
EOS%: 2.9 % (ref 0.0–7.0)
HGB: 13.2 g/dL (ref 11.6–15.9)
LYMPH#: 4.2 10*3/uL — ABNORMAL HIGH (ref 0.9–3.3)
MCHC: 33.2 g/dL (ref 32.0–36.0)
NEUT#: 3 10*3/uL (ref 1.5–6.5)
Platelets: 337 10*3/uL (ref 145–400)

## 2013-04-10 LAB — IRON AND TIBC
%SAT: 29 % (ref 20–55)
Iron: 137 ug/dL (ref 42–145)
TIBC: 473 ug/dL — ABNORMAL HIGH (ref 250–470)
UIBC: 336 ug/dL (ref 125–400)

## 2013-04-10 LAB — VITAMIN B12: Vitamin B-12: 435 pg/mL (ref 211–911)

## 2013-04-10 LAB — FERRITIN: Ferritin: 303 ng/mL — ABNORMAL HIGH (ref 10–291)

## 2013-04-10 NOTE — Progress Notes (Signed)
DIAGNOSIS:  Iron deficiency anemia.  Current therapy: IV iron as indicated.  Last dose of IV iron was 02/06/2013  Interim history: Dana Lozano presents today for an office followup visit.  She last received, IV iron.  Back on 02/06/2013.  At that time, her iron was 28.  Her percent saturation was unable to be calculated.  Her ferritin was 5.  We did check a vitamin B12 level on her, it was normal at 474.  Her hemoglobin was 11.4, prior to the, iron infusion, it is now 13.2.  She continues to see a specialty line.  Disease, clinic up in [place], [place].  She does have some neurologic issues from the Lyme disease.  She otherwise reports, that she has a decent, appetite.  She denies any nausea, vomiting, diarrhea, constipation, chest pain, shortness breath, or cough.  She denies any fevers, chills, or night sweats.  She denies any obvious, or abnormal bleeding.  She denies any lower leg swelling.  She denies any headaches, visual changes, or rashes.  Review of Systems: Constitutional:Negative for malaise/fatigue, fever, chills, weight loss, diaphoresis, activity change, appetite change, and unexpected weight change.  HEENT: Negative for double vision, blurred vision, visual loss, ear pain, tinnitus, congestion, rhinorrhea, epistaxis sore throat or sinus disease, oral pain/lesion, tongue soreness Respiratory: Negative for cough, chest tightness, shortness of breath, wheezing and stridor.  Cardiovascular: Negative for chest pain, palpitations, leg swelling, orthopnea, PND, DOE or claudication Gastrointestinal: Negative for nausea, vomiting, abdominal pain, diarrhea, constipation, blood in stool, melena, hematochezia, abdominal distention, anal bleeding, rectal pain, anorexia and hematemesis.  Genitourinary: Negative for dysuria, frequency, hematuria,  Musculoskeletal: Negative for myalgias, back pain, joint swelling, arthralgias and gait problem.  Skin: Negative for rash, color change, pallor and wound.   Neurological:. Negative for dizziness/light-headedness, tremors, seizures, syncope, facial asymmetry, speech difficulty, weakness, numbness, headaches and paresthesias.  Hematological: Negative for adenopathy. Does not bruise/bleed easily.  Psychiatric/Behavioral:  Negative for depression, no loss of interest in normal activity or change in sleep pattern.   Physical Exam: This is a pleasant, 32 year old, well-developed, well-nourished, white female, in no obvious distress Vitals: Temperature 98.1 degrees, pulse 99, respirations 16, blood pressure 127/84.  Weight 193 pounds HEENT reveals a normocephalic, atraumatic skull, no scleral icterus, no oral lesions  Neck is supple without any cervical or supraclavicular adenopathy.  Lungs are clear to auscultation bilaterally. There are no wheezes, rales or rhonci Cardiac is regular rate and rhythm with a normal S1 and S2. There are no murmurs, rubs, or bruits.  Abdomen is soft with good bowel sounds, there is no palpable mass. There is no palpable hepatosplenomegaly. There is no palpable fluid wave.  Musculoskeletal no tenderness of the spine, ribs, or hips.  Extremities there are no clubbing, cyanosis, or edema.  Skin no petechia, purpura or ecchymosis Neurologic is nonfocal.  Laboratory Data: White count 8.0, hemoglobin 13.2, hematocrit 39.8, platelets 337,000  Current Outpatient Prescriptions on File Prior to Visit  Medication Sig Dispense Refill  . buPROPion (WELLBUTRIN XL) 300 MG 24 hr tablet Take 300 mg by mouth every morning.      . clonazePAM (KLONOPIN) 1 MG tablet Take 3 mg by mouth every evening.      . colesevelam (WELCHOL) 625 MG tablet Take 1,250 mg by mouth 2 (two) times daily with a meal.      . diazepam (VALIUM) 5 MG tablet Take 5-10 mg by mouth every 6 (six) hours as needed. Seizures      . diphenoxylate-atropine (LOMOTIL)  2.5-0.025 MG per tablet Take 2 tablets by mouth as needed. IBS      . esomeprazole (NEXIUM) 40 MG capsule  Take 40 mg by mouth 2 (two) times daily.      Marland Kitchen HYDROcodone-acetaminophen (NORCO) 10-325 MG per tablet Take 1 tablet by mouth every 6 (six) hours as needed. Pain      . hydrocortisone (CORTEF) 10 MG tablet Take 10 mg by mouth. 2 tabs am and 1 pm      . hydrOXYzine (ATARAX/VISTARIL) 25 MG tablet Take 25 mg by mouth every evening.      . L-GLUTAMINE PO Take by mouth every morning.      Marland Kitchen L-Methylfolate (DEPLIN) 15 MG TABS Take 15 mg by mouth daily.      Marland Kitchen lamoTRIgine (LAMICTAL) 150 MG tablet Take 75-300 mg by mouth daily. 75mg  in the am and 300 in the pm      . levonorgestrel-ethinyl estradiol (SEASONALE) 0.15-0.03 MG tablet Take 1 tablet by mouth daily.      . NON FORMULARY Take by mouth daily. NATTOKINASA - ACETYL L-CARNITINE - RESVERATROL - MILK THISTLE      . OXcarbazepine (TRILEPTAL) 150 MG tablet Take 75-300 mg by mouth 2 (two) times daily. 75 am and 300 at pm      . pentosan polysulfate (ELMIRON) 100 MG capsule Take 200 mg by mouth 2 (two) times daily.      . pregabalin (LYRICA) 25 MG capsule Take 25 mg by mouth every evening.      . Probiotic Product (PROBIOTIC DAILY PO) Take by mouth every morning.      . promethazine (PHENERGAN) 25 MG tablet Take 25 mg by mouth as needed.      . venlafaxine XR (EFFEXOR-XR) 75 MG 24 hr capsule Take 225 mg by mouth daily.      . sucralfate (CARAFATE) 1 GM/10ML suspension Take 1 g by mouth as needed.        No current facility-administered medications on file prior to visit.   Assessment/Plan: This is a pleasant, 32 year old, white, female, with the following issues:  #1.  Iron deficiency anemia.  Her hemoglobin has come up quite nicely.  We are checking an iron panel on her today.  #2.  History of lung disease.  She is seeing a specialty, lung disease clinic in [place], [place].  #3.  Followup.  [name] will follow back up with [place] in 3 months, but before then should there be questions or concerns.

## 2013-07-10 ENCOUNTER — Ambulatory Visit: Payer: BC Managed Care – PPO | Admitting: Hematology & Oncology

## 2013-07-10 ENCOUNTER — Other Ambulatory Visit: Payer: BC Managed Care – PPO | Admitting: Lab

## 2013-07-16 ENCOUNTER — Telehealth: Payer: Self-pay | Admitting: Hematology & Oncology

## 2013-07-16 NOTE — Telephone Encounter (Signed)
Pt's mother Malachi Bonds) called to cancel appt, due to pt is sick today. Will cb to reschedule at a later time.

## 2013-07-17 ENCOUNTER — Ambulatory Visit: Payer: BC Managed Care – PPO | Admitting: Hematology & Oncology

## 2013-07-17 ENCOUNTER — Other Ambulatory Visit: Payer: BC Managed Care – PPO | Admitting: Lab

## 2013-09-08 ENCOUNTER — Other Ambulatory Visit: Payer: Self-pay

## 2013-12-13 ENCOUNTER — Emergency Department (HOSPITAL_COMMUNITY)
Admission: EM | Admit: 2013-12-13 | Discharge: 2013-12-13 | Disposition: A | Discharge status: Home / Self Care | Payer: BC Managed Care – PPO | Attending: Emergency Medicine | Admitting: Emergency Medicine

## 2013-12-13 ENCOUNTER — Encounter (HOSPITAL_COMMUNITY): Payer: Self-pay | Admitting: Emergency Medicine

## 2013-12-13 DIAGNOSIS — R569 Unspecified convulsions: Secondary | ICD-10-CM

## 2013-12-13 DIAGNOSIS — K2100 Gastro-esophageal reflux disease with esophagitis, without bleeding: Secondary | ICD-10-CM | POA: Insufficient documentation

## 2013-12-13 DIAGNOSIS — Z862 Personal history of diseases of the blood and blood-forming organs and certain disorders involving the immune mechanism: Secondary | ICD-10-CM | POA: Insufficient documentation

## 2013-12-13 DIAGNOSIS — Z87891 Personal history of nicotine dependence: Secondary | ICD-10-CM | POA: Insufficient documentation

## 2013-12-13 DIAGNOSIS — Z8613 Personal history of malaria: Secondary | ICD-10-CM | POA: Insufficient documentation

## 2013-12-13 DIAGNOSIS — Z792 Long term (current) use of antibiotics: Secondary | ICD-10-CM | POA: Insufficient documentation

## 2013-12-13 DIAGNOSIS — R5381 Other malaise: Secondary | ICD-10-CM | POA: Insufficient documentation

## 2013-12-13 DIAGNOSIS — M542 Cervicalgia: Secondary | ICD-10-CM | POA: Insufficient documentation

## 2013-12-13 DIAGNOSIS — Z87448 Personal history of other diseases of urinary system: Secondary | ICD-10-CM | POA: Insufficient documentation

## 2013-12-13 DIAGNOSIS — K219 Gastro-esophageal reflux disease without esophagitis: Secondary | ICD-10-CM | POA: Insufficient documentation

## 2013-12-13 DIAGNOSIS — R51 Headache: Secondary | ICD-10-CM | POA: Insufficient documentation

## 2013-12-13 DIAGNOSIS — Z8619 Personal history of other infectious and parasitic diseases: Secondary | ICD-10-CM | POA: Insufficient documentation

## 2013-12-13 DIAGNOSIS — Z9889 Other specified postprocedural states: Secondary | ICD-10-CM | POA: Insufficient documentation

## 2013-12-13 DIAGNOSIS — IMO0001 Reserved for inherently not codable concepts without codable children: Secondary | ICD-10-CM | POA: Insufficient documentation

## 2013-12-13 DIAGNOSIS — Z79899 Other long term (current) drug therapy: Secondary | ICD-10-CM | POA: Insufficient documentation

## 2013-12-13 DIAGNOSIS — G40909 Epilepsy, unspecified, not intractable, without status epilepticus: Secondary | ICD-10-CM | POA: Insufficient documentation

## 2013-12-13 HISTORY — DX: Unspecified convulsions: R56.9

## 2013-12-13 LAB — URINALYSIS, ROUTINE W REFLEX MICROSCOPIC
Bilirubin Urine: NEGATIVE
Glucose, UA: NEGATIVE mg/dL
Hgb urine dipstick: NEGATIVE
Ketones, ur: NEGATIVE mg/dL
Leukocytes, UA: NEGATIVE
Nitrite: NEGATIVE
Protein, ur: NEGATIVE mg/dL
Specific Gravity, Urine: 1.028 (ref 1.005–1.030)
Urobilinogen, UA: 0.2 mg/dL (ref 0.0–1.0)
pH: 6 (ref 5.0–8.0)

## 2013-12-13 LAB — POCT I-STAT, CHEM 8
BUN: 9 mg/dL (ref 6–23)
Chloride: 105 mEq/L (ref 96–112)
Sodium: 140 mEq/L (ref 135–145)

## 2013-12-13 MED ORDER — ONDANSETRON HCL 4 MG/2ML IJ SOLN
4.0000 mg | Freq: Once | INTRAMUSCULAR | Status: AC
  Administered 2013-12-13: 4 mg via INTRAVENOUS
  Filled 2013-12-13: qty 2

## 2013-12-13 MED ORDER — KETOROLAC TROMETHAMINE 30 MG/ML IJ SOLN
30.0000 mg | Freq: Once | INTRAMUSCULAR | Status: AC
  Administered 2013-12-13: 30 mg via INTRAVENOUS
  Filled 2013-12-13: qty 1

## 2013-12-13 MED ORDER — PROMETHAZINE HCL 25 MG/ML IJ SOLN
12.5000 mg | Freq: Once | INTRAMUSCULAR | Status: AC
  Administered 2013-12-13: 12.5 mg via INTRAVENOUS
  Filled 2013-12-13: qty 1

## 2013-12-13 NOTE — ED Provider Notes (Addendum)
CSN: [id number]     Arrival date & time 12/13/13  1632 History   First MD Initiated Contact with Patient 12/13/13 1712     Chief Complaint  Patient presents with  . Seizures   (Consider location/radiation/quality/duration/timing/severity/associated sxs/prior Treatment) HPI Ms. Dana Lozano is a 32 year old female who was brought in by EMS for seizure-like activity . She reports a 7+ year history of lyme disease and  "American Malaria Parasite", for which she has had extensive medical treatment by a doctor in [place], [place]. She reports as average 2 seizures per month, during which she describes left sided weakness and facial twitching. Today's seizure, however, was worse, with increased weakness and body shakes. She reports that her entire left side went numb and was shaking, but dragged herself across the room to reach phone to call EMS. Valium usually improves seizure-like symptoms, but today it did not. She feels that left body is still "limp".  She also complains of increased neck pain and headache, from her normal pain. She reports that headache is the same quality as her usual, headaches, but is more intense, than others. She has been given a "t-medication" in the past, that I suspect is Toradol, which improved symptoms.  She denies dizziness, blurred vision, diplopia. She denies syncope, chest pain and unusual shortness of breath.  She was given 5 of versed by EMS, which resolved symptoms.  Past Medical History  Diagnosis Date  . Lyme disease   . Interstitial cystitis   . Bile salt-induced diarrhea   . GERD (gastroesophageal reflux disease)   . Bile reflux esophagitis   . Fibromyalgia   . Anemia, iron deficiency 02/05/2013  . Seizure    Past Surgical History  Procedure Laterality Date  . Cholecystectomy    . Port-a-cath removal    . Sinusotomy    . Exploratory laparotomy     History reviewed. No pertinent family history. History  Substance Use Topics  . Smoking status: Former  Smoker    Quit date: 01/16/2003  . Smokeless tobacco: Never Used  . Alcohol Use: No   OB History   Grav Para Term Preterm Abortions TAB SAB Ect Mult Living                 Review of Systems All other systems negative except as documented in the HPI. All pertinent positives and negatives as reviewed in the HPI.  Allergies  Doxycycline; Ibuprofen; and Septra  Home Medications   Current Outpatient Rx  Name  Route  Sig  Dispense  Refill  . atovaquone (MEPRON) 750 MG/5ML suspension   Oral   Take by mouth 2 (two) times daily. 4 teaspoons - every day for one week. Then one week off.         Marland Kitchen buPROPion (WELLBUTRIN XL) 300 MG 24 hr tablet   Oral   Take 300 mg by mouth every morning.         . cefdinir (OMNICEF) 300 MG capsule   Oral   Take 300 mg by mouth 2 (two) times daily. Takes every day for 2 weeks. Then off for two weeks.         . ciprofloxacin (CIPRO) 500 MG tablet   Oral   Take 500 mg by mouth.         . clonazePAM (KLONOPIN) 1 MG tablet   Oral   Take 3 mg by mouth every evening.         . colesevelam (WELCHOL) 625 MG tablet  Oral   Take 1,250 mg by mouth 2 (two) times daily with a meal.         . diazepam (VALIUM) 5 MG tablet   Oral   Take 5-10 mg by mouth every 6 (six) hours as needed. Seizures         . diphenoxylate-atropine (LOMOTIL) 2.5-0.025 MG per tablet   Oral   Take 2 tablets by mouth as needed. IBS         . esomeprazole (NEXIUM) 40 MG capsule   Oral   Take 40 mg by mouth 2 (two) times daily.         Marland Kitchen HYDROcodone-acetaminophen (NORCO) 10-325 MG per tablet   Oral   Take 1 tablet by mouth every 6 (six) hours as needed. Pain         . hydrocortisone (CORTEF) 10 MG tablet   Oral   Take 10 mg by mouth. 2 tabs am and 1 pm         . hydrOXYzine (ATARAX/VISTARIL) 25 MG tablet   Oral   Take 25 mg by mouth every evening.         . L-GLUTAMINE PO   Oral   Take by mouth every morning.         Marland Kitchen L-Methylfolate (DEPLIN)  15 MG TABS   Oral   Take 15 mg by mouth daily.         Marland Kitchen lamoTRIgine (LAMICTAL) 150 MG tablet   Oral   Take 75-300 mg by mouth daily. 75mg  in the am and 300 in the pm         . levonorgestrel-ethinyl estradiol (SEASONALE) 0.15-0.03 MG tablet   Oral   Take 1 tablet by mouth daily.         . NON FORMULARY   Oral   Take by mouth daily. NATTOKINASA - ACETYL L-CARNITINE - RESVERATROL - MILK THISTLE         . OXcarbazepine (TRILEPTAL) 150 MG tablet   Oral   Take 75-300 mg by mouth 2 (two) times daily. 75 am and 300 at pm         . pentosan polysulfate (ELMIRON) 100 MG capsule   Oral   Take 200 mg by mouth 2 (two) times daily.         . pregabalin (LYRICA) 25 MG capsule   Oral   Take 25 mg by mouth every evening.         . Probiotic Product (PROBIOTIC DAILY PO)   Oral   Take by mouth every morning.         . promethazine (PHENERGAN) 25 MG tablet   Oral   Take 25 mg by mouth as needed.         . sucralfate (CARAFATE) 1 GM/10ML suspension   Oral   Take 1 g by mouth as needed.          . venlafaxine XR (EFFEXOR-XR) 75 MG 24 hr capsule   Oral   Take 225 mg by mouth daily.          BP 126/95  Pulse 109  Temp(Src) 98.5 F (36.9 C) (Oral)  Resp 19  SpO2 94% Physical Exam  Nursing note and vitals reviewed. Constitutional: She is oriented to person, place, and time. She appears well-developed and well-nourished. No distress.  HENT:  Head: Normocephalic and atraumatic.  Mouth/Throat: Oropharynx is clear and moist.  Eyes: Pupils are equal, round, and reactive to light.  Neck: Normal range of  motion. Neck supple.  Cardiovascular: Normal rate, regular rhythm and normal heart sounds.  Exam reveals no gallop and no friction rub.   No murmur heard. Pulmonary/Chest: Effort normal and breath sounds normal. No respiratory distress.  Neurological: She is alert and oriented to person, place, and time. She exhibits normal muscle tone. Coordination normal.   Skin: Skin is warm and dry. No rash noted.    ED Course  Procedures (including critical care time) Patient is referred back to her primary Dr. for further evaluation.  The patient is feeling better at this time.  Patient did not have any neurological deficits noted on exam.  5 mg of Versed resolved the  Patient's symptoms. Patient has been under a lot of stress due to an issue with someone in her apartment building. The patient feels this is what brought on this issue.  Carlyle Dolly, PA-C 12/13/13 2008  Carlyle Dolly, PA-C 12/29/13 [phone]

## 2013-12-13 NOTE — ED Notes (Signed)
Pt from home, EMS called out for seizures. Upon EMS arrival pt had L sided jerking. EMS gave 5 of versed which resolved symptoms. Pt states she has chronic lyme disease which causes seizures. Pt alert and oriented upon arrival.

## 2013-12-13 NOTE — ED Provider Notes (Signed)
Medical screening examination/treatment/procedure(s) were performed by non-physician practitioner and as supervising physician I was immediately available for consultation/collaboration.  Flint Melter, MD 12/13/13 [phone]

## 2013-12-13 NOTE — ED Notes (Signed)
Bed: WU98 Expected date: 12/13/13 Expected time: 4:34 PM Means of arrival:  Comments: EMS-Seizure

## 2013-12-29 NOTE — ED Provider Notes (Signed)
Medical screening examination/treatment/procedure(s) were performed by non-physician practitioner and as supervising physician I was immediately available for consultation/collaboration.  EKG Interpretation    Date/Time:  Sunday December 13 2013 16:38:25 EST Ventricular Rate:  104 PR Interval:  152 QRS Duration: 99 QT Interval:  335 QTC Calculation: 441 R Axis:   71 Text Interpretation:  Sinus tachycardia Abnormal inferior Q waves ED PHYSICIAN INTERPRETATION AVAILABLE IN [facility] Confirmed by TEST, RECORD ([id number]) on 12/15/2013 7:43:00 AM             Flint Melter, MD 12/29/13 [phone]

## 2014-02-04 ENCOUNTER — Encounter (HOSPITAL_COMMUNITY): Payer: Self-pay | Admitting: Emergency Medicine

## 2014-02-04 ENCOUNTER — Emergency Department (HOSPITAL_COMMUNITY): Payer: BC Managed Care – PPO

## 2014-02-04 ENCOUNTER — Emergency Department (HOSPITAL_COMMUNITY)
Admission: EM | Admit: 2014-02-04 | Discharge: 2014-02-04 | Disposition: A | Discharge status: Home / Self Care | Payer: BC Managed Care – PPO | Attending: Emergency Medicine | Admitting: Emergency Medicine

## 2014-02-04 DIAGNOSIS — G8929 Other chronic pain: Secondary | ICD-10-CM | POA: Insufficient documentation

## 2014-02-04 DIAGNOSIS — Z8619 Personal history of other infectious and parasitic diseases: Secondary | ICD-10-CM | POA: Insufficient documentation

## 2014-02-04 DIAGNOSIS — R109 Unspecified abdominal pain: Secondary | ICD-10-CM

## 2014-02-04 DIAGNOSIS — K219 Gastro-esophageal reflux disease without esophagitis: Secondary | ICD-10-CM | POA: Insufficient documentation

## 2014-02-04 DIAGNOSIS — Z79899 Other long term (current) drug therapy: Secondary | ICD-10-CM | POA: Insufficient documentation

## 2014-02-04 DIAGNOSIS — K589 Irritable bowel syndrome without diarrhea: Secondary | ICD-10-CM | POA: Insufficient documentation

## 2014-02-04 DIAGNOSIS — G40909 Epilepsy, unspecified, not intractable, without status epilepticus: Secondary | ICD-10-CM | POA: Insufficient documentation

## 2014-02-04 DIAGNOSIS — K5900 Constipation, unspecified: Secondary | ICD-10-CM | POA: Insufficient documentation

## 2014-02-04 DIAGNOSIS — Z87891 Personal history of nicotine dependence: Secondary | ICD-10-CM | POA: Insufficient documentation

## 2014-02-04 DIAGNOSIS — Z862 Personal history of diseases of the blood and blood-forming organs and certain disorders involving the immune mechanism: Secondary | ICD-10-CM | POA: Insufficient documentation

## 2014-02-04 DIAGNOSIS — Z3202 Encounter for pregnancy test, result negative: Secondary | ICD-10-CM | POA: Insufficient documentation

## 2014-02-04 DIAGNOSIS — Z87442 Personal history of urinary calculi: Secondary | ICD-10-CM | POA: Insufficient documentation

## 2014-02-04 DIAGNOSIS — G43909 Migraine, unspecified, not intractable, without status migrainosus: Secondary | ICD-10-CM | POA: Insufficient documentation

## 2014-02-04 DIAGNOSIS — J45909 Unspecified asthma, uncomplicated: Secondary | ICD-10-CM | POA: Insufficient documentation

## 2014-02-04 HISTORY — DX: Gastroparesis: K31.84

## 2014-02-04 HISTORY — DX: Irritable bowel syndrome, unspecified: K58.9

## 2014-02-04 HISTORY — DX: Diarrhea, unspecified: R19.7

## 2014-02-04 HISTORY — DX: Unspecified abdominal pain: R10.9

## 2014-02-04 HISTORY — DX: Nausea with vomiting, unspecified: R11.2

## 2014-02-04 HISTORY — DX: Migraine, unspecified, not intractable, without status migrainosus: G43.909

## 2014-02-04 HISTORY — DX: Unspecified asthma, uncomplicated: J45.909

## 2014-02-04 HISTORY — DX: Calculus of kidney: N20.0

## 2014-02-04 HISTORY — DX: Other chronic pain: G89.29

## 2014-02-04 LAB — URINALYSIS, ROUTINE W REFLEX MICROSCOPIC
BILIRUBIN URINE: NEGATIVE
Glucose, UA: NEGATIVE mg/dL
Hgb urine dipstick: NEGATIVE
Ketones, ur: NEGATIVE mg/dL
LEUKOCYTES UA: NEGATIVE
NITRITE: NEGATIVE
PH: 7 (ref 5.0–8.0)
Protein, ur: NEGATIVE mg/dL
Specific Gravity, Urine: 1.017 (ref 1.005–1.030)
Urobilinogen, UA: 0.2 mg/dL (ref 0.0–1.0)

## 2014-02-04 LAB — COMPREHENSIVE METABOLIC PANEL
ALBUMIN: 4.1 g/dL (ref 3.5–5.2)
ALK PHOS: 143 U/L — AB (ref 39–117)
ALT: 12 U/L (ref 0–35)
AST: 14 U/L (ref 0–37)
BILIRUBIN TOTAL: 0.2 mg/dL — AB (ref 0.3–1.2)
BUN: 9 mg/dL (ref 6–23)
CHLORIDE: 98 meq/L (ref 96–112)
CO2: 19 mEq/L (ref 19–32)
Calcium: 9.9 mg/dL (ref 8.4–10.5)
Creatinine, Ser: 0.83 mg/dL (ref 0.50–1.10)
GFR calc Af Amer: >90 mL/min (ref >90)
GFR calc non Af Amer: >90 mL/min (ref >90)
Glucose, Bld: 103 mg/dL — ABNORMAL HIGH (ref 70–99)
POTASSIUM: 4.1 meq/L (ref 3.7–5.3)
Sodium: 137 mEq/L (ref 137–147)
Total Protein: 8.2 g/dL (ref 6.0–8.3)

## 2014-02-04 LAB — CBC WITH DIFFERENTIAL/PLATELET
BASOS ABS: 0 10*3/uL (ref 0.0–0.1)
Basophils Relative: 1 % (ref 0–1)
Eosinophils Absolute: 0.2 10*3/uL (ref 0.0–0.7)
Eosinophils Relative: 2 % (ref 0–5)
HCT: 40.2 % (ref 36.0–46.0)
Hemoglobin: 13.5 g/dL (ref 12.0–15.0)
Lymphocytes Relative: 56 % — ABNORMAL HIGH (ref 12–46)
Lymphs Abs: 4.7 10*3/uL — ABNORMAL HIGH (ref 0.7–4.0)
MCH: 29.4 pg (ref 26.0–34.0)
MCHC: 33.6 g/dL (ref 30.0–36.0)
MCV: 87.6 fL (ref 78.0–100.0)
Monocytes Absolute: 0.5 10*3/uL (ref 0.1–1.0)
Monocytes Relative: 6 % (ref 3–12)
NEUTROS ABS: 2.9 10*3/uL (ref 1.7–7.7)
NEUTROS PCT: 35 % — AB (ref 43–77)
PLATELETS: 375 10*3/uL (ref 150–400)
RBC: 4.59 MIL/uL (ref 3.87–5.11)
RDW: 12.1 % (ref 11.5–15.5)
WBC: 8.3 10*3/uL (ref 4.0–10.5)

## 2014-02-04 LAB — LIPASE, BLOOD: Lipase: 23 U/L (ref 11–59)

## 2014-02-04 LAB — POCT PREGNANCY, URINE: PREG TEST UR: NEGATIVE

## 2014-02-04 MED ORDER — FAMOTIDINE IN NACL 20-0.9 MG/50ML-% IV SOLN: 20.0000 mg | Freq: Once | INTRAVENOUS | Status: DC

## 2014-02-04 MED ORDER — METOCLOPRAMIDE HCL 5 MG/ML IJ SOLN
10.0000 mg | Freq: Once | INTRAMUSCULAR | Status: AC
  Administered 2014-02-04: 10 mg via INTRAVENOUS
  Filled 2014-02-04: qty 2

## 2014-02-04 MED ORDER — IOHEXOL 300 MG/ML  SOLN
50.0000 mL | Freq: Once | INTRAMUSCULAR | Status: AC | PRN
  Administered 2014-02-04: 50 mL via ORAL

## 2014-02-04 MED ORDER — FENTANYL CITRATE 0.05 MG/ML IJ SOLN
50.0000 ug | INTRAMUSCULAR | Status: AC | PRN
  Administered 2014-02-04 (×2): 50 ug via INTRAVENOUS
  Filled 2014-02-04 (×2): qty 2

## 2014-02-04 MED ORDER — FAMOTIDINE IN NACL 20-0.9 MG/50ML-% IV SOLN
20.0000 mg | Freq: Once | INTRAVENOUS | Status: AC
  Administered 2014-02-04: 20 mg via INTRAVENOUS
  Filled 2014-02-04: qty 50

## 2014-02-04 MED ORDER — IOHEXOL 300 MG/ML  SOLN
100.0000 mL | Freq: Once | INTRAMUSCULAR | Status: AC | PRN
  Administered 2014-02-04: 100 mL via INTRAVENOUS

## 2014-02-04 MED ORDER — SODIUM CHLORIDE 0.9 % IV SOLN
INTRAVENOUS | Status: DC
  Administered 2014-02-04 (×2): via INTRAVENOUS

## 2014-02-04 MED ORDER — PROMETHAZINE HCL 25 MG RE SUPP: 25.0000 mg | Freq: Four times a day (QID) | RECTAL | Status: DC | PRN

## 2014-02-04 MED ORDER — ONDANSETRON HCL 4 MG/2ML IJ SOLN
4.0000 mg | INTRAMUSCULAR | Status: DC | PRN
  Administered 2014-02-04: 4 mg via INTRAVENOUS
  Filled 2014-02-04: qty 2

## 2014-02-04 MED ORDER — LORAZEPAM 2 MG/ML IJ SOLN
1.0000 mg | Freq: Once | INTRAMUSCULAR | Status: AC
  Administered 2014-02-04: 1 mg via INTRAVENOUS
  Filled 2014-02-04: qty 1

## 2014-02-04 MED ORDER — SODIUM CHLORIDE 0.9 % IV BOLUS (SEPSIS)
500.0000 mL | Freq: Once | INTRAVENOUS | Status: AC
  Administered 2014-02-04: 500 mL via INTRAVENOUS

## 2014-02-04 NOTE — ED Notes (Addendum)
Pt reports hasn't taken any of her medications today because she hasn't been able to keep down any food or drink; pt has hx of seizures, last seizure was in December 2014, unable to take seizure medication today.

## 2014-02-04 NOTE — ED Notes (Signed)
Pt in lobby practically lying on floor.  Diaphoretic.  States large history with multiple medications.  C/O RLQ pain since last night.  Pt taken back d/t diaphoretic status.

## 2014-02-04 NOTE — Discharge Instructions (Signed)
°Emergency Department Resource Guide °1) Find a Doctor and Pay Out of Pocket °Although you won't have to find out who is covered by your insurance plan, it is a good idea to ask around and get recommendations. You will then need to call the office and see if the doctor you have chosen will accept you as a new patient and what types of options they offer for patients who are self-pay. Some doctors offer discounts or will set up payment plans for their patients who do not have insurance, but you will need to ask so you aren't surprised when you get to your appointment. ° °2) Contact Your Local Health Department °Not all health departments have doctors that can see patients for sick visits, but many do, so it is worth a call to see if yours does. If you don't know where your local health department is, you can check in your phone book. The CDC also has a tool to help you locate your state's health department, and many state websites also have listings of all of their local health departments. ° °3) Find a Walk-in Clinic °If your illness is not likely to be very severe or complicated, you may want to try a walk in clinic. These are popping up all over the country in pharmacies, drugstores, and shopping centers. They're usually staffed by nurse practitioners or physician assistants that have been trained to treat common illnesses and complaints. They're usually fairly quick and inexpensive. However, if you have serious medical issues or chronic medical problems, these are probably not your best option. ° °No Primary Care Doctor: °- Call [facility] Connect at  832-8000 - they can help you locate a primary care doctor that  accepts your insurance, provides certain services, etc. °- Physician Referral Service- 1-800-533-3463 ° °Chronic Pain Problems: °Organization         Address  Phone   Notes  °[facility]  (336) [phone] Patients need to be referred by their primary care doctor.  ° °Medication  Assistance: °Organization         Address  Phone   Notes  °[place] [facility] [address] °[place], [place] [address] (336) 641-8030 --Must be a resident of [place] °-- Must have NO insurance coverage whatsoever (no Medicaid/ Medicare, etc.) °-- The pt. MUST have a primary care doctor that directs their care regularly and follows them in the community °  °[facility]  (866) 331-1348   °[employer]  (888) 892-1162   ° °Agencies that provide inexpensive medical care: °Organization         Address  Phone   Notes  °[facility]  (336) 832-8035   °[facility]    (336) 832-7272   °[facility] [address] °[place], [place] [address] (336) 832-4777   °[facility] [address], °[place] (336) 271-4999   °[facility]    (336) 373-0678   °[facility]    (336) 272-1050   °[facility] ° [address], [place] Phone:  (336) 832-4444, Fax:  (336) 832-4440 Hours of Operation:  9 am - 6 pm, M-F.  Also accepts Medicaid/Medicare and self-pay.  °[facility] ° [address], [place] Phone: (336) [phone], Fax: (336) [phone]. Hours of Operation:  8:30 am - 5:30 pm, M-F.  Also accepts Medicaid and self-pay.  °[facility] [place] [address], [place] Phone: (336) 878-6027   °[facility] [address], [place], [place] (336)723-1848, Ext. 123 Mondays & Thursdays: 7-9 AM.  First 15 patients are seen on a first come, first serve basis. °  ° °Medicaid-accepting [place] Providers: ° °Organization         Address  Phone   Notes  °[facility] [address], [place] (336) 641-2100 Also accepts self-pay patients.  °[facility] [address], [place] ° (336) 856-9996   °[facility] [address], [place]  (336) 288-8857   °[facility] [address] [place] [address], [place] (336) 299-7000   °Veita Bland [address], [place]  ° (336) 373-1557 Only accepts [place] Access Medicaid patients after they have their name applied to their card.  ° °Self-Pay (no insurance) in [place]: ° °Organization         Address  Phone   Notes  °Sickle Cell Patients, [facility] [address], [place] (336) 832-1970   °[facility] [address], [place] (336) 832-4400   °[facility] ° [address], [place] (336) 992-4800   °[facility]/Dr. Osei-Bonsu ° [address] [place] [address], [place] or [address], [place] (336) [phone] Phone number for both [place] and [place] locations is the same.  °[facility] [address], [place] (336) 299-0000   °[facility] [address] [place] [address], [place] or [address] (336) 852-7530 °(336) 878-2260   °[facility] [address], [place] (336) 350-1642, phone; (336) 294-5005, fax Sees patients 1st and 3rd Saturday of every month.  Must not qualify for public or private insurance (i.e. Medicaid, Medicare, [place] Health Choice, Veterans' Benefits) • Household income should be no more than 200% of the poverty level •The clinic cannot treat you if you are pregnant or think you are pregnant • Sexually transmitted diseases are not treated at the clinic.  ° ° °Dental Care: °Organization         Address  Phone  Notes  °[place] [facility] [address], [place] (336) 641-6152 Accepts children up to age 21 who are enrolled in Medicaid or [place] Health Choice; pregnant women with a Medicaid card; and children who have applied for Medicaid or [place] Health Choice, but were declined, whose parents can pay a reduced fee at time of service.  °[place]  [facility] [place]  [address], [place] (336) 641-7733 Accepts children up to age 21 who are enrolled in Medicaid or [place] Health Choice; pregnant women with a Medicaid card; and children who have applied for Medicaid or [place] Health Choice, but were declined, whose parents can pay a reduced fee at time of service.  °[facility] ° [address], [place] (336) 641-4533 Patients are seen by appointment only. Walk-ins are not accepted. [facility] will see patients 18 years of age and older. °Monday - Tuesday (8am-5pm) °Most Wednesdays (8:30-5pm) °$30 per visit, cash only  °[facility] ° [address], [place] (336) 641-4533 Patients are seen by appointment only. Walk-ins are not accepted. [facility] will see patients 18 years of age and older. °One   Wednesday Evening (Monthly: Volunteer Based).  $30 per visit, cash only  °[facility]  (919) 537-3737 for adults; Children under age 4, call Graduate Pediatric Dentistry at ([phone]) [phone]. Children aged 4-14, please call ([phone]) [phone] to request a pediatric application. ° Dental services are provided in all areas of dental care including fillings, crowns and bridges, complete and partial dentures, implants, gum treatment, root canals, and extractions. Preventive care is also provided. Treatment is provided to both adults and children. °Patients are selected via a lottery and there is often a waiting list. °  °[facility] [address], °[place] ° (336) 763-8833 [email or web address] °  °[facility] [address], [place], [place] (336)723-1848, Ext. 123 Second and Fourth Thursday of each month, opens at 6:30 AM; Clinic ends at 9 AM.  Patients are seen on a first-come first-served basis, and a limited number are seen during each clinic.  ° °[facility] ° [address], [place], [place] (336) [phone]    Eligibility Requirements °You must have lived in [place], [place], or [place] counties for at least the last three months. °  You cannot be eligible for state or federal sponsored healthcare insurance, including Veterans Administration, Medicaid, or Medicare. °  You generally cannot be eligible for healthcare insurance through your employer.  °  How to apply: °Eligibility screenings are held every Tuesday and Wednesday afternoon from 1:00 pm until 4:00 pm. You do not need an appointment for the interview!  °[facility] [address], [place], [place] 336-631-2330   °[facility]  336-342-8273   °[facility]  336-703-3100   °[facility]  336-570-6415   ° °Behavioral Health Resources in the Community: °Intensive Outpatient Programs °Organization         Address  Phone  Notes  °[facility] [address], [place], [place] 336-878-6098   °[facility] [address], [place], [place] 336-832-9800   °[facility] [address], [place], [place] ° 336-882-2125   °[facility] [address],  °[place], [place] 1-800-853-5163 or 336-641-4981   °Substance Abuse Resources °Organization         Address  Phone  Notes  °[facility]  336-882-2125   °[facility]  336-784-9470   °[facility]  336-285-9073   °[facility]  336-845-3988   °Residential & Outpatient Substance Abuse Program  1-800-659-3381   °Psychological Services °Organization         Address  Phone  Notes  °[facility]  336- 832-9600   °[facility]  336- 378-7881   °[facility] [address], [place] 1-800-853-5163 or 336-641-4981   ° °Mobile Crisis Teams °Organization         Address  Phone  Notes  °[facility], Mobile Crisis Care Unit  1-877-626-1772   °Assertive °[facility] ° [address]  [place], [place] 336-834-9664   °Sharon DeEsch [address] °[place] 336-554-5454   ° °Self-Help/Support Groups °Organization         Address  Phone             Notes  °[facility]  - variety of support groups  336- 373-1402 Call for more information  °Narcotics Anonymous (NA), [facility] [address], °[place]  2 meetings at this location  ° °  Residential Treatment Programs Organization         Address  Phone  Notes  [facility] [address],    [place]  [phone]   [facility]  [address], [place] [phone], [place], [place] [phone]   [facility] [address], [place] [phone] Admissions: 8am-3pm M-F  [facility] [address]    [place], [place] [phone]   [facility] [address], [place], [place] [phone]   [facility] [address]  [place], [place] [phone]   [facility] Intensive Outpatient [address], [place], [place] [phone]   [facility] ([facility] [address]  [place], [place] [phone] or [phone]   [facility] [address] [place], [place] [phone])717-1996 Accepts Medicaid  [facility] [address]  [place] [phone] Substance Abuse/Addiction Treatment   [facility] Resources Organization         Address  Phone  Notes  [facility]  631-474-2141(888) (947)551-8603   Angie FavaJulie Brannon, PhD [address], [address] [place], [place]   970-769-2117(336) 684-191-2037 or [phone](336) [phone]   [facility]   [address] [place], [place] [phone](336) [phone]   [facility] [address], [place], [place] (334)316-7580(336) ([phone] Insurance/Medicaid/sponsorship through [facility] [address]                                    [place], [place] [phone](336) [phone] Therapy/tele-psych/case    [facility] [address]   [place], [place] [phone]) [phone]    Dr. Lolly MustacheArfeen  [phone](336) [phone]   [facility]  [employer] [facility] 1) [address], [place] 2) [address], [place] 3)  [address], [place] ([phone](336) [phone](336) [phone]  [phone](336) [phone]   [facility] Child Abuse Hotline 562-071-5073(336) 3251210812 or [phone](336) [phone] (After Hours)       Take over the counter laxative (such as miralax, milk of magnesia, senokot) AND a dulcolax suppository today and repeat both tomorrow.  Begin to take over the counter stool softener (colace), as directed on packaging, for the next month.  Continue to take your usual prescriptions as previously directed.  Call your regular medical doctor and your GI doctor tomorrow to schedule a follow up appointment within the next 2 days.  Return to the Emergency Department immediately if worsening.

## 2014-02-04 NOTE — ED Provider Notes (Signed)
CSN: [id number]     Arrival date & time 02/04/14  1823 History   First MD Initiated Contact with Patient 02/04/14 1851     Chief Complaint  Patient presents with  . Abdominal Pain  . Nausea    HPI Pt was seen at 1900.  Per pt, c/o gradual onset and persistence of constant acute flair of her chronic generalized abd "pain" for the past 2 weeks, worse over the past 3 days. States the pain is generalized, but has increased in her RLQ over the past 3 days.  Has been associated with nausea and constipation. States she "hasn't been able to take any of my meds today" due to the nausea.  Describes the abd pain as "throbbing" and "stabbing."  Denies vomiting/darrhea, no fevers, no back pain, no rash, no CP/SOB, no black or blood in stools or emesis, no dysuria/hematuria, no vaginal bleeding/discharge.  Pt has a significant hx of IBS, GERD, gastroparesis, IC, recurrent N/V/D and chronic abd pain.     Past Medical History  Diagnosis Date  . Lyme disease   . Interstitial cystitis   . Bile salt-induced diarrhea   . GERD (gastroesophageal reflux disease)   . Bile reflux esophagitis   . Fibromyalgia   . Anemia, iron deficiency 02/05/2013  . Seizure   . IBS (irritable bowel syndrome)   . Gastroparesis   . Chronic abdominal pain   . Migraine headache   . Asthma   . GERD (gastroesophageal reflux disease)   . Nausea vomiting and diarrhea     recurrent  . Kidney stones    Past Surgical History  Procedure Laterality Date  . Cholecystectomy    . Port-a-cath removal    . Sinusotomy    . Exploratory laparotomy      History  Substance Use Topics  . Smoking status: Former Smoker    Quit date: 01/16/2003  . Smokeless tobacco: Never Used  . Alcohol Use: No    Review of Systems ROS: Statement: All systems negative except as marked or noted in the HPI; Constitutional: Negative for fever and chills. ; ; Eyes: Negative for eye pain, redness and discharge. ; ; ENMT: Negative for ear pain, hoarseness,  nasal congestion, sinus pressure and sore throat. ; ; Cardiovascular: Negative for chest pain, palpitations, diaphoresis, dyspnea and peripheral edema. ; ; Respiratory: Negative for cough, wheezing and stridor. ; ; Gastrointestinal: +abd pain, nausea, constipation. Negative for vomiting, diarrhea, blood in stool, hematemesis, jaundice and rectal bleeding. . ; ; Genitourinary: Negative for dysuria, flank pain and hematuria. ; ; GYN:  No vaginal bleeding, no vaginal discharge, no vulvar pain.;;  Musculoskeletal: Negative for back pain and neck pain. Negative for swelling and trauma.; ; Skin: Negative for pruritus, rash, abrasions, blisters, bruising and skin lesion.; ; Neuro: Negative for headache, lightheadedness and neck stiffness. Negative for weakness, altered level of consciousness , altered mental status, extremity weakness, paresthesias, involuntary movement, seizure and syncope.     Allergies  Doxycycline; Ibuprofen; Lactose intolerance (gi); and Septra  Home Medications   Current Outpatient Rx  Name  Route  Sig  Dispense  Refill  . buPROPion (WELLBUTRIN XL) 300 MG 24 hr tablet   Oral   Take 300 mg by mouth every morning.         . cetirizine (ZYRTEC) 10 MG tablet   Oral   Take 10 mg by mouth at bedtime.         . cimetidine (TAGAMET) 200 MG tablet   Oral  Take 200 mg by mouth at bedtime.         . clonazePAM (KLONOPIN) 1 MG tablet   Oral   Take 1 mg by mouth 4 (four) times daily as needed for anxiety.          . colesevelam (WELCHOL) 625 MG tablet   Oral   Take 1,250 mg by mouth as needed (when any meals are consumed).          . diazepam (VALIUM) 5 MG tablet   Oral   Take 5-15 mg by mouth 3 (three) times daily as needed for anxiety (seizure).          . diphenoxylate-atropine (LOMOTIL) 2.5-0.025 MG per tablet   Oral   Take 2 tablets by mouth 4 (four) times daily as needed (IBS).          Marland Kitchen esomeprazole (NEXIUM) 20 MG capsule   Oral   Take 20 mg by mouth  every morning.          Marland Kitchen HYDROcodone-acetaminophen (NORCO) 10-325 MG per tablet   Oral   Take 1 tablet by mouth every 6 (six) hours as needed for moderate pain or severe pain.          . hydrocortisone (CORTEF) 5 MG tablet   Oral   Take 5 mg by mouth daily.         . hydrOXYzine (ATARAX/VISTARIL) 25 MG tablet   Oral   Take 25-50 mg by mouth every evening.          Marland Kitchen L-Methylfolate (DEPLIN) 15 MG TABS   Oral   Take 15 mg by mouth every evening.          . lamoTRIgine (LAMICTAL) 100 MG tablet   Oral   Take 200 mg by mouth 2 (two) times daily.         Marland Kitchen levonorgestrel-ethinyl estradiol (SEASONALE) 0.15-0.03 MG tablet   Oral   Take 1 tablet by mouth every evening.          Marland Kitchen NATTOKINASE PO   Oral   Take 2 capsules by mouth as needed (when any food is consumed).         . OXcarbazepine (TRILEPTAL) 150 MG tablet   Oral   Take 300 mg by mouth 2 (two) times daily.          . pentosan polysulfate (ELMIRON) 100 MG capsule   Oral   Take 200 mg by mouth 2 (two) times daily.         . pregabalin (LYRICA) 25 MG capsule   Oral   Take 25 mg by mouth every evening.         . Probiotic Product (PROBIOTIC DAILY PO)   Oral   Take 1 capsule by mouth as needed (when any meals are consumed).          . promethazine (PHENERGAN) 25 MG tablet   Oral   Take 25 mg by mouth 3 (three) times daily as needed for nausea or vomiting.          Marland Kitchen QUEtiapine (SEROQUEL) 25 MG tablet   Oral   Take 25 mg by mouth at bedtime as needed (sleep).         . venlafaxine XR (EFFEXOR-XR) 75 MG 24 hr capsule   Oral   Take 225 mg by mouth every morning.           BP 178/117  Pulse 127  Temp(Src) 97.5 F (36.4 C) (Oral)  Resp 18  SpO2 99%  LMP 11/22/2013 Physical Exam 1905: Physical examination:  Nursing notes reviewed; Vital signs and O2 SAT reviewed;  Constitutional: Well developed, Well nourished, Well hydrated, In no acute distress; Head:  Normocephalic,  atraumatic; Eyes: EOMI, PERRL, No scleral icterus; ENMT: Mouth and pharynx normal, Mucous membranes moist; Neck: Supple, Full range of motion, No lymphadenopathy; Cardiovascular: Tachycardic rate and rhythm, No murmur, rub, or gallop; Respiratory: Breath sounds clear & equal bilaterally, No rales, rhonchi, wheezes.  Speaking full sentences with ease, Normal respiratory effort/excursion; Chest: Nontender, Movement normal; Abdomen: Soft, +diffuse tenderness to palp. No rebound or guarding. Nondistended, Normal bowel sounds; Genitourinary: No CVA tenderness; Extremities: Pulses normal, No tenderness, No edema, No calf edema or asymmetry.; Neuro: AA&Ox3, Major CN grossly intact.  Speech clear. No gross focal motor or sensory deficits in extremities.; Skin: Color normal, Warm, Dry.   ED Course  Procedures  EKG Interpretation   None       MDM  MDM Reviewed: previous chart, nursing note and vitals Reviewed previous: labs and CT scan Interpretation: labs, x-ray and CT scan     Results for orders placed during the hospital encounter of 02/04/14  CBC WITH DIFFERENTIAL      Result Value Range   WBC 8.3  4.0 - 10.5 K/uL   RBC 4.59  3.87 - 5.11 MIL/uL   Hemoglobin 13.5  12.0 - 15.0 g/dL   HCT 56.240.2  13.036.0 - 86.546.0 %   MCV 87.6  78.0 - 100.0 fL   MCH 29.4  26.0 - 34.0 pg   MCHC 33.6  30.0 - 36.0 g/dL   RDW 78.412.1  69.611.5 - 29.515.5 %   Platelets 375  150 - 400 K/uL   Neutrophils Relative % 35 (*) 43 - 77 %   Neutro Abs 2.9  1.7 - 7.7 K/uL   Lymphocytes Relative 56 (*) 12 - 46 %   Lymphs Abs 4.7 (*) 0.7 - 4.0 K/uL   Monocytes Relative 6  3 - 12 %   Monocytes Absolute 0.5  0.1 - 1.0 K/uL   Eosinophils Relative 2  0 - 5 %   Eosinophils Absolute 0.2  0.0 - 0.7 K/uL   Basophils Relative 1  0 - 1 %   Basophils Absolute 0.0  0.0 - 0.1 K/uL  COMPREHENSIVE METABOLIC PANEL      Result Value Range   Sodium 137  137 - 147 mEq/L   Potassium 4.1  3.7 - 5.3 mEq/L   Chloride 98  96 - 112 mEq/L   CO2 19  19 - 32  mEq/L   Glucose, Bld 103 (*) 70 - 99 mg/dL   BUN 9  6 - 23 mg/dL   Creatinine, Ser 2.840.83  0.50 - 1.10 mg/dL   Calcium 9.9  8.4 - 13.210.5 mg/dL   Total Protein 8.2  6.0 - 8.3 g/dL   Albumin 4.1  3.5 - 5.2 g/dL   AST 14  0 - 37 U/L   ALT 12  0 - 35 U/L   Alkaline Phosphatase 143 (*) 39 - 117 U/L   Total Bilirubin 0.2 (*) 0.3 - 1.2 mg/dL   GFR calc non Af Amer >90  >90 mL/min   GFR calc Af Amer >90  >90 mL/min  LIPASE, BLOOD      Result Value Range   Lipase 23  11 - 59 U/L  URINALYSIS, ROUTINE W REFLEX MICROSCOPIC      Result Value Range   Color, Urine  YELLOW  YELLOW   APPearance CLEAR  CLEAR   Specific Gravity, Urine 1.017  1.005 - 1.030   pH 7.0  5.0 - 8.0   Glucose, UA NEGATIVE  NEGATIVE mg/dL   Hgb urine dipstick NEGATIVE  NEGATIVE   Bilirubin Urine NEGATIVE  NEGATIVE   Ketones, ur NEGATIVE  NEGATIVE mg/dL   Protein, ur NEGATIVE  NEGATIVE mg/dL   Urobilinogen, UA 0.2  0.0 - 1.0 mg/dL   Nitrite NEGATIVE  NEGATIVE   Leukocytes, UA NEGATIVE  NEGATIVE  POCT PREGNANCY, URINE      Result Value Range   Preg Test, Ur NEGATIVE  NEGATIVE   Dg Chest 2 View 02/04/2014   CLINICAL DATA:  33 year old female abdominal pain nausea and weakness. Initial encounter.  EXAM: CHEST  2 VIEW  COMPARISON:  10/11/2009.  FINDINGS: Upright AP and lateral views of the chest. Mildly lower lung volumes. Mild elevation of the right hemidiaphragm. Normal cardiac size and mediastinal contours. Visualized tracheal air column is within normal limits. No pneumothorax, pulmonary edema, pleural effusion or confluent pulmonary opacity. No acute osseous abnormality identified. Negative visualized bowel gas pattern.  IMPRESSION: No acute cardiopulmonary abnormality.   Electronically Signed   By: Augusto Gamble M.D.   On: 02/04/2014 20:08   Ct Abdomen Pelvis W Contrast 02/04/2014   CLINICAL DATA:  Abdominal pain and nausea  EXAM: CT ABDOMEN AND PELVIS WITH CONTRAST  TECHNIQUE: Multidetector CT imaging of the abdomen and pelvis was  performed using the standard protocol following bolus administration of intravenous contrast.  CONTRAST:  50mL OMNIPAQUE IOHEXOL 300 MG/ML SOLN, OMNIPAQUE IOHEXOL 300 MG/ML SOLN  COMPARISON:  09/19/2010  FINDINGS: BODY WALL: Unremarkable.  LOWER CHEST: Unremarkable.  ABDOMEN/PELVIS:  Liver: Diffuse low-attenuation of the liver. Previously seen hypodense nodules are no longer evident, that may have represented focal fatty deposits, or may be inapparent now that there is diffuse fatty infiltration.  Biliary: Cholecystectomy.  Pancreas: Unremarkable.  Spleen: Unremarkable.  Adrenals: Unremarkable.  Kidneys and ureters: No hydronephrosis or stone.  Bladder: Unremarkable.  Reproductive: Unremarkable.  Bowel: Moderate volume of formed stool. No bowel obstruction. Normal appendix.  Retroperitoneum: No mass or adenopathy.  Peritoneum: No free fluid or gas.  Vascular: No acute abnormality.  OSSEOUS: No acute abnormalities.  IMPRESSION: 1. Normal appendix. 2. Possible constipation. 3. Fatty liver.   Electronically Signed   By: Tiburcio Pea M.D.   On: 02/04/2014 21:17    2230:  Pt has tol PO well while in the ED without N/V.  No stooling while in the ED.  Abd benign, VSS. Feels better after meds and wants to go home now. Dx and testing d/w pt.  Questions answered.  Verb understanding, agreeable to d/c home with outpt f/u.    Laray Anger, DO 02/06/14 1906

## 2014-02-04 NOTE — ED Notes (Addendum)
Pt c/o RLQ abdominal pain, nausea, and constipation starting yesterday evening.  Pain score 9/10.  Pt sts history of multiple GI problems.

## 2014-04-12 ENCOUNTER — Emergency Department (HOSPITAL_COMMUNITY)
Admission: EM | Admit: 2014-04-12 | Discharge: 2014-04-12 | Disposition: A | Discharge status: Home / Self Care | Payer: BC Managed Care – PPO | Attending: Emergency Medicine | Admitting: Emergency Medicine

## 2014-04-12 ENCOUNTER — Encounter (HOSPITAL_COMMUNITY): Payer: Self-pay | Admitting: Emergency Medicine

## 2014-04-12 DIAGNOSIS — K219 Gastro-esophageal reflux disease without esophagitis: Secondary | ICD-10-CM | POA: Insufficient documentation

## 2014-04-12 DIAGNOSIS — G40909 Epilepsy, unspecified, not intractable, without status epilepticus: Secondary | ICD-10-CM | POA: Insufficient documentation

## 2014-04-12 DIAGNOSIS — G8929 Other chronic pain: Secondary | ICD-10-CM | POA: Insufficient documentation

## 2014-04-12 DIAGNOSIS — IMO0001 Reserved for inherently not codable concepts without codable children: Secondary | ICD-10-CM | POA: Insufficient documentation

## 2014-04-12 DIAGNOSIS — Z87442 Personal history of urinary calculi: Secondary | ICD-10-CM | POA: Insufficient documentation

## 2014-04-12 DIAGNOSIS — Z862 Personal history of diseases of the blood and blood-forming organs and certain disorders involving the immune mechanism: Secondary | ICD-10-CM | POA: Insufficient documentation

## 2014-04-12 DIAGNOSIS — K589 Irritable bowel syndrome without diarrhea: Secondary | ICD-10-CM | POA: Insufficient documentation

## 2014-04-12 DIAGNOSIS — J45909 Unspecified asthma, uncomplicated: Secondary | ICD-10-CM | POA: Insufficient documentation

## 2014-04-12 DIAGNOSIS — R112 Nausea with vomiting, unspecified: Secondary | ICD-10-CM | POA: Insufficient documentation

## 2014-04-12 DIAGNOSIS — R109 Unspecified abdominal pain: Secondary | ICD-10-CM | POA: Insufficient documentation

## 2014-04-12 DIAGNOSIS — Z8619 Personal history of other infectious and parasitic diseases: Secondary | ICD-10-CM | POA: Insufficient documentation

## 2014-04-12 DIAGNOSIS — R300 Dysuria: Secondary | ICD-10-CM | POA: Insufficient documentation

## 2014-04-12 DIAGNOSIS — R5383 Other fatigue: Secondary | ICD-10-CM

## 2014-04-12 DIAGNOSIS — Z3202 Encounter for pregnancy test, result negative: Secondary | ICD-10-CM | POA: Insufficient documentation

## 2014-04-12 DIAGNOSIS — R197 Diarrhea, unspecified: Secondary | ICD-10-CM | POA: Insufficient documentation

## 2014-04-12 DIAGNOSIS — G43909 Migraine, unspecified, not intractable, without status migrainosus: Secondary | ICD-10-CM | POA: Insufficient documentation

## 2014-04-12 DIAGNOSIS — R5381 Other malaise: Secondary | ICD-10-CM | POA: Insufficient documentation

## 2014-04-12 DIAGNOSIS — Z87891 Personal history of nicotine dependence: Secondary | ICD-10-CM | POA: Insufficient documentation

## 2014-04-12 DIAGNOSIS — Z79899 Other long term (current) drug therapy: Secondary | ICD-10-CM | POA: Insufficient documentation

## 2014-04-12 LAB — COMPREHENSIVE METABOLIC PANEL
ALT: 23 U/L (ref 0–35)
AST: 19 U/L (ref 0–37)
Albumin: 4.3 g/dL (ref 3.5–5.2)
Alkaline Phosphatase: 124 U/L — ABNORMAL HIGH (ref 39–117)
BUN: 8 mg/dL (ref 6–23)
CO2: 22 mEq/L (ref 19–32)
Calcium: 10.3 mg/dL (ref 8.4–10.5)
Chloride: 100 mEq/L (ref 96–112)
Creatinine, Ser: 0.7 mg/dL (ref 0.50–1.10)
GFR calc Af Amer: >90 mL/min (ref >90)
GFR calc non Af Amer: >90 mL/min (ref >90)
Glucose, Bld: 108 mg/dL — ABNORMAL HIGH (ref 70–99)
Potassium: 4 mEq/L (ref 3.7–5.3)
Sodium: 139 mEq/L (ref 137–147)
Total Bilirubin: 0.3 mg/dL (ref 0.3–1.2)
Total Protein: 8.2 g/dL (ref 6.0–8.3)

## 2014-04-12 LAB — URINALYSIS, ROUTINE W REFLEX MICROSCOPIC
Bilirubin Urine: NEGATIVE
Glucose, UA: NEGATIVE mg/dL
Hgb urine dipstick: NEGATIVE
Ketones, ur: NEGATIVE mg/dL
Nitrite: NEGATIVE
Protein, ur: NEGATIVE mg/dL
Specific Gravity, Urine: 1.017 (ref 1.005–1.030)
Urobilinogen, UA: 0.2 mg/dL (ref 0.0–1.0)
pH: 7.5 (ref 5.0–8.0)

## 2014-04-12 LAB — CBC WITH DIFFERENTIAL/PLATELET
Basophils Absolute: 0 10*3/uL (ref 0.0–0.1)
Basophils Relative: 0 % (ref 0–1)
Eosinophils Absolute: 0.1 10*3/uL (ref 0.0–0.7)
Eosinophils Relative: 2 % (ref 0–5)
HCT: 42.7 % (ref 36.0–46.0)
Hemoglobin: 14.4 g/dL (ref 12.0–15.0)
Lymphocytes Relative: 31 % (ref 12–46)
Lymphs Abs: 2.1 10*3/uL (ref 0.7–4.0)
MCH: 30.4 pg (ref 26.0–34.0)
MCHC: 33.7 g/dL (ref 30.0–36.0)
MCV: 90.3 fL (ref 78.0–100.0)
Monocytes Absolute: 0.7 10*3/uL (ref 0.1–1.0)
Monocytes Relative: 10 % (ref 3–12)
Neutro Abs: 3.9 10*3/uL (ref 1.7–7.7)
Neutrophils Relative %: 57 % (ref 43–77)
RBC: 4.73 MIL/uL (ref 3.87–5.11)
RDW: 13.5 % (ref 11.5–15.5)
WBC: 6.8 10*3/uL (ref 4.0–10.5)

## 2014-04-12 LAB — URINE MICROSCOPIC-ADD ON

## 2014-04-12 LAB — PREGNANCY, URINE: Preg Test, Ur: NEGATIVE

## 2014-04-12 LAB — LIPASE, BLOOD: Lipase: 18 U/L (ref 11–59)

## 2014-04-12 MED ORDER — PROMETHAZINE HCL 25 MG RE SUPP: 25.0000 mg | Freq: Four times a day (QID) | RECTAL | Status: DC | PRN

## 2014-04-12 MED ORDER — PROMETHAZINE HCL 25 MG/ML IJ SOLN
25.0000 mg | Freq: Once | INTRAMUSCULAR | Status: DC
  Filled 2014-04-12: qty 1

## 2014-04-12 MED ORDER — FENTANYL CITRATE 0.05 MG/ML IJ SOLN
50.0000 ug | Freq: Once | INTRAMUSCULAR | Status: AC
  Administered 2014-04-12: 50 ug via INTRAVENOUS
  Filled 2014-04-12: qty 2

## 2014-04-12 MED ORDER — MORPHINE SULFATE 4 MG/ML IJ SOLN
4.0000 mg | Freq: Once | INTRAMUSCULAR | Status: AC
  Administered 2014-04-12: 4 mg via INTRAVENOUS
  Filled 2014-04-12: qty 1

## 2014-04-12 MED ORDER — SODIUM CHLORIDE 0.9 % IV BOLUS (SEPSIS)
1000.0000 mL | INTRAVENOUS | Status: AC
  Administered 2014-04-12: 1000 mL via INTRAVENOUS

## 2014-04-12 MED ORDER — LORAZEPAM 2 MG/ML IJ SOLN
1.0000 mg | Freq: Once | INTRAMUSCULAR | Status: AC
  Administered 2014-04-12: 1 mg via INTRAVENOUS
  Filled 2014-04-12: qty 1

## 2014-04-12 MED ORDER — METOCLOPRAMIDE HCL 5 MG/ML IJ SOLN
10.0000 mg | Freq: Once | INTRAMUSCULAR | Status: AC
  Administered 2014-04-12: 10 mg via INTRAVENOUS
  Filled 2014-04-12: qty 2

## 2014-04-12 MED ORDER — FAMOTIDINE IN NACL 20-0.9 MG/50ML-% IV SOLN
20.0000 mg | Freq: Once | INTRAVENOUS | Status: AC
  Administered 2014-04-12: 20 mg via INTRAVENOUS
  Filled 2014-04-12: qty 50

## 2014-04-12 NOTE — ED Notes (Signed)
Patient able to ambulate to bathroom unassisted.

## 2014-04-12 NOTE — Discharge Instructions (Signed)
Abdominal Pain, Adult  Many things can cause belly (abdominal) pain. Most times, the belly pain is not dangerous. Many cases of belly pain can be watched and treated at home.  HOME CARE   · Do not take medicines that help you go poop (laxatives) unless told to by your doctor.  · Only take medicine as told by your doctor.  · Eat or drink as told by your doctor. Your doctor will tell you if you should be on a special diet.  GET HELP IF:  · You do not know what is causing your belly pain.  · You have belly pain while you are sick to your stomach (nauseous) or have runny poop (diarrhea).  · You have pain while you pee or poop.  · Your belly pain wakes you up at night.  · You have belly pain that gets worse or better when you eat.  · You have belly pain that gets worse when you eat fatty foods.  GET HELP RIGHT AWAY IF:   · The pain does not go away within 2 hours.  · You have a fever.  · You keep throwing up (vomiting).  · The pain changes and is only in the right or left part of the belly.  · You have bloody or tarry looking poop.  MAKE SURE YOU:   · Understand these instructions.  · Will watch your condition.  · Will get help right away if you are not doing well or get worse.  Document Released: 06/04/2008 Document Revised: 10/07/2013 Document Reviewed: 08/26/2013  Exit[employer]® Patient Information ©2014 [employer].

## 2014-04-12 NOTE — ED Notes (Signed)
[facility] Expected date:  Expected time:  Means of arrival:  Comments: ems

## 2014-04-12 NOTE — ED Notes (Signed)
Patient states she has been having nausea and vomiting for 24 hours and was recently [facility]. Patient is having what appears to be dry heaves and is diaphoretic.

## 2014-04-12 NOTE — ED Notes (Signed)
Patient cannot urinate

## 2014-04-12 NOTE — ED Provider Notes (Signed)
CSN: [id number]     Arrival date & time 04/12/14  1707 History   First MD Initiated Contact with Patient 04/12/14 1727     Chief Complaint  Patient presents with  . Nausea  . Emesis     (Consider location/radiation/quality/duration/timing/severity/associated sxs/prior Treatment) HPI Pt is a 33yo female with extensive GI hx including interstitial cystitis, GERD, IBS, gastropareies, chronic abdominal pain, kidney stones, and recurrent nausea vomiting and diarrhea. Pt brought to ED today via EMS c/o abdominal pain as well as n/v/d x24 hours. Pt states she cannot keep anything down including medications.  [facility] weeks ago, last week she was given a "certain food" for a gastric emptying study but results are still pending. Pt states she has not felt well since that test.  Per EMS, pt is having dry heaves and is diaphoretic.  Pt is accompanied by mother who states pt is out of her phenergan and clonopin and cannot have more until next month but until then she has "just been trying to get by."   Pt is unable to count how many episodes of vomiting or diarrhea she had today. Abdominal pain is constant, centralized like normal.  Pt also c/o pain with urination but denies vaginal symptoms. Abdominal hx significant for cholecystectomy and exploratory laparotomy.  Denies sick contacts or recent travel.    Past Medical History  Diagnosis Date  . Lyme disease   . Interstitial cystitis   . Bile salt-induced diarrhea   . GERD (gastroesophageal reflux disease)   . Bile reflux esophagitis   . Fibromyalgia   . Anemia, iron deficiency 02/05/2013  . Seizure   . IBS (irritable bowel syndrome)   . Gastroparesis   . Chronic abdominal pain   . Migraine headache   . Asthma   . GERD (gastroesophageal reflux disease)   . Nausea vomiting and diarrhea     recurrent  . Kidney stones    Past Surgical History  Procedure Laterality Date  . Cholecystectomy    . Port-a-cath  removal    . Sinusotomy    . Exploratory laparotomy     No family history on file. History  Substance Use Topics  . Smoking status: Former Smoker    Quit date: 01/16/2003  . Smokeless tobacco: Never Used  . Alcohol Use: No   OB History   Grav Para Term Preterm Abortions TAB SAB Ect Mult Living                 Review of Systems  Constitutional: Positive for fatigue. Negative for fever and chills.  Respiratory: Negative for shortness of breath.   Cardiovascular: Negative for chest pain.  Gastrointestinal: Positive for nausea, vomiting, abdominal pain and diarrhea. Negative for constipation and blood in stool.  Genitourinary: Positive for dysuria. Negative for urgency, frequency, hematuria, flank pain, decreased urine volume, vaginal bleeding, vaginal discharge, vaginal pain and pelvic pain.  Musculoskeletal: Negative for back pain.  All other systems reviewed and are negative.     Allergies  Doxycycline; Ibuprofen; Lactose intolerance (gi); and Septra  Home Medications   Current Outpatient Rx  Name  Route  Sig  Dispense  Refill  . buPROPion (WELLBUTRIN XL) 300 MG 24 hr tablet   Oral   Take 300 mg by mouth every morning.         . cetirizine (ZYRTEC) 10 MG tablet   Oral   Take 10 mg by mouth at bedtime.         .Marland Kitchen  cimetidine (TAGAMET) 200 MG tablet   Oral   Take 200 mg by mouth at bedtime.         . clonazePAM (KLONOPIN) 1 MG tablet   Oral   Take 1 mg by mouth 4 (four) times daily as needed for anxiety.          . colesevelam (WELCHOL) 625 MG tablet   Oral   Take 1,250 mg by mouth as needed (when any meals are consumed).          . diazepam (VALIUM) 5 MG tablet   Oral   Take 5-15 mg by mouth 3 (three) times daily as needed for anxiety (seizure).          . diphenoxylate-atropine (LOMOTIL) 2.5-0.025 MG per tablet   Oral   Take 2 tablets by mouth 4 (four) times daily as needed (IBS).          Marland Kitchen. esomeprazole (NEXIUM) 20 MG capsule   Oral   Take  20 mg by mouth every morning.          Marland Kitchen. HYDROcodone-acetaminophen (NORCO) 10-325 MG per tablet   Oral   Take 1 tablet by mouth every 6 (six) hours as needed for moderate pain or severe pain.          . hydrocortisone (CORTEF) 5 MG tablet   Oral   Take 5 mg by mouth daily.         . hydrOXYzine (ATARAX/VISTARIL) 25 MG tablet   Oral   Take 25-50 mg by mouth every evening.          Marland Kitchen. L-Methylfolate (DEPLIN) 15 MG TABS   Oral   Take 15 mg by mouth every evening.          . lamoTRIgine (LAMICTAL) 100 MG tablet   Oral   Take 200 mg by mouth 2 (two) times daily.         Marland Kitchen. levonorgestrel-ethinyl estradiol (SEASONALE) 0.15-0.03 MG tablet   Oral   Take 1 tablet by mouth every evening.          . OXcarbazepine (TRILEPTAL) 150 MG tablet   Oral   Take 300 mg by mouth 2 (two) times daily.          . pentosan polysulfate (ELMIRON) 100 MG capsule   Oral   Take 200 mg by mouth 2 (two) times daily.         . pregabalin (LYRICA) 25 MG capsule   Oral   Take 25 mg by mouth every evening.         . Probiotic Product (PROBIOTIC DAILY PO)   Oral   Take 1 capsule by mouth as needed (when any meals are consumed).          . promethazine (PHENERGAN) 25 MG tablet   Oral   Take 25 mg by mouth 3 (three) times daily as needed for nausea or vomiting.          Marland Kitchen. QUEtiapine (SEROQUEL) 25 MG tablet   Oral   Take 25 mg by mouth at bedtime as needed (sleep).         . venlafaxine XR (EFFEXOR-XR) 75 MG 24 hr capsule   Oral   Take 225 mg by mouth every morning.          . promethazine (PHENERGAN) 25 MG suppository   Rectal   Place 1 suppository (25 mg total) rectally every 6 (six) hours as needed for nausea or vomiting.   10 each  0    BP 121/85  Pulse 80  Temp(Src) 98.3 F (36.8 C) (Oral)  Resp 18  SpO2 100% Physical Exam  Nursing note and vitals reviewed. Constitutional: She appears well-developed and well-nourished.  Pt lying on exam bed, tearful,  appears uncomfortable.  HENT:  Head: Normocephalic and atraumatic.  Mouth/Throat: Mucous membranes are dry.  Eyes: Conjunctivae are normal. No scleral icterus.  Neck: Normal range of motion.  Cardiovascular: Normal rate, regular rhythm and normal heart sounds.   Pulmonary/Chest: Effort normal and breath sounds normal. No respiratory distress. She has no wheezes. She has no rales. She exhibits no tenderness.  Abdominal: Soft. Bowel sounds are normal. She exhibits no distension and no mass. There is tenderness. There is no rebound and no guarding.  Obese abdomen, soft, non-distended, tenderness in central abdomen. No rebound or guarding.   Musculoskeletal: Normal range of motion.  Neurological: She is alert.  Skin: Skin is warm and dry.    ED Course  Procedures (including critical care time) Labs Review Labs Reviewed  COMPREHENSIVE METABOLIC PANEL - Abnormal; Notable for the following:    Glucose, Bld 108 (*)    Alkaline Phosphatase 124 (*)    All other components within normal limits  URINALYSIS, ROUTINE W REFLEX MICROSCOPIC - Abnormal; Notable for the following:    APPearance CLOUDY (*)    Leukocytes, UA SMALL (*)    All other components within normal limits  URINE MICROSCOPIC-ADD ON - Abnormal; Notable for the following:    Bacteria, UA FEW (*)    Casts GRANULAR CAST (*)    All other components within normal limits  CBC WITH DIFFERENTIAL  LIPASE, BLOOD  PREGNANCY, URINE   Imaging Review No results found.   EKG Interpretation None      MDM   Final diagnoses:  Abdominal pain  Nausea vomiting and diarrhea    Pt with extensive abdominal hx including cystitis, GERD, IBS, gastropareies, chronic abdominal pain, kidney stones, and recurrent nausea vomiting and diarrhea, presenting to ED c/o nausea and vomiting x24 hours.  Pt was seen at [facility] 2 weeks ago and GI specialist last week for workup for gastroparesis.  Medical records from [facility] reviewed.  Pt does have  gastroparesis.  On exam, pt appears uncomfortable, dry heaving, dry mucous membranes.    Discussed pt with Dr. Rubin Payor, no repeat imaging needed at this time.  Will repeat labs and tx symptomatically.   Labs: unremarkable.    Symptoms have improved while pt in ED, including fentanyl, pepcid, reglan, and morphin. Pt given ativan per request as symptoms increased pt's anxiety. Pt states she normally takes anxiety medication at home.    Pt able to keep down fluids in ED.  Will discharge home, advised to f/u with GI specialist, advised to call tomorrow to schedule appointment. Pt agreed.  Rx: phenergan suppository. Return precautions provided. Pt verbalized understanding and agreement with tx plan.     Junius Finner, PA-C 04/13/14 [phone]

## 2014-04-13 NOTE — ED Provider Notes (Signed)
Medical screening examination/treatment/procedure(s) were performed by non-physician practitioner and as supervising physician I was immediately available for consultation/collaboration.   EKG Interpretation None       Ashwath Lasch R. Aeryn Medici, MD 04/13/14 1519 

## 2014-04-16 ENCOUNTER — Emergency Department (HOSPITAL_COMMUNITY)
Admission: EM | Admit: 2014-04-16 | Discharge: 2014-04-16 | Disposition: A | Discharge status: Home / Self Care | Payer: BC Managed Care – PPO | Attending: Emergency Medicine | Admitting: Emergency Medicine

## 2014-04-16 ENCOUNTER — Encounter (HOSPITAL_COMMUNITY): Payer: Self-pay | Admitting: Emergency Medicine

## 2014-04-16 DIAGNOSIS — G43909 Migraine, unspecified, not intractable, without status migrainosus: Secondary | ICD-10-CM | POA: Insufficient documentation

## 2014-04-16 DIAGNOSIS — Z79899 Other long term (current) drug therapy: Secondary | ICD-10-CM | POA: Insufficient documentation

## 2014-04-16 DIAGNOSIS — K219 Gastro-esophageal reflux disease without esophagitis: Secondary | ICD-10-CM | POA: Insufficient documentation

## 2014-04-16 DIAGNOSIS — Z8619 Personal history of other infectious and parasitic diseases: Secondary | ICD-10-CM | POA: Insufficient documentation

## 2014-04-16 DIAGNOSIS — R209 Unspecified disturbances of skin sensation: Secondary | ICD-10-CM | POA: Insufficient documentation

## 2014-04-16 DIAGNOSIS — IMO0002 Reserved for concepts with insufficient information to code with codable children: Secondary | ICD-10-CM | POA: Insufficient documentation

## 2014-04-16 DIAGNOSIS — G40909 Epilepsy, unspecified, not intractable, without status epilepticus: Secondary | ICD-10-CM | POA: Insufficient documentation

## 2014-04-16 DIAGNOSIS — K2100 Gastro-esophageal reflux disease with esophagitis, without bleeding: Secondary | ICD-10-CM | POA: Insufficient documentation

## 2014-04-16 DIAGNOSIS — Z87442 Personal history of urinary calculi: Secondary | ICD-10-CM | POA: Insufficient documentation

## 2014-04-16 DIAGNOSIS — R202 Paresthesia of skin: Secondary | ICD-10-CM

## 2014-04-16 DIAGNOSIS — D509 Iron deficiency anemia, unspecified: Secondary | ICD-10-CM | POA: Insufficient documentation

## 2014-04-16 DIAGNOSIS — G8929 Other chronic pain: Secondary | ICD-10-CM

## 2014-04-16 DIAGNOSIS — Z87891 Personal history of nicotine dependence: Secondary | ICD-10-CM | POA: Insufficient documentation

## 2014-04-16 DIAGNOSIS — K3184 Gastroparesis: Secondary | ICD-10-CM | POA: Insufficient documentation

## 2014-04-16 DIAGNOSIS — R6889 Other general symptoms and signs: Secondary | ICD-10-CM | POA: Insufficient documentation

## 2014-04-16 DIAGNOSIS — Z87448 Personal history of other diseases of urinary system: Secondary | ICD-10-CM | POA: Insufficient documentation

## 2014-04-16 DIAGNOSIS — J45909 Unspecified asthma, uncomplicated: Secondary | ICD-10-CM | POA: Insufficient documentation

## 2014-04-16 LAB — CBC WITH DIFFERENTIAL/PLATELET
BASOS PCT: 0 % (ref 0–1)
Basophils Absolute: 0 10*3/uL (ref 0.0–0.1)
EOS ABS: 0 10*3/uL (ref 0.0–0.7)
EOS PCT: 0 % (ref 0–5)
HEMATOCRIT: 35.7 % — AB (ref 36.0–46.0)
HEMOGLOBIN: 11.9 g/dL — AB (ref 12.0–15.0)
Lymphocytes Relative: 41 % (ref 12–46)
Lymphs Abs: 3.1 10*3/uL (ref 0.7–4.0)
MCH: 29.5 pg (ref 26.0–34.0)
MCHC: 33.3 g/dL (ref 30.0–36.0)
MCV: 88.6 fL (ref 78.0–100.0)
MONO ABS: 0.3 10*3/uL (ref 0.1–1.0)
MONOS PCT: 5 % (ref 3–12)
Neutro Abs: 4 10*3/uL (ref 1.7–7.7)
Neutrophils Relative %: 54 % (ref 43–77)
Platelets: 379 10*3/uL (ref 150–400)
RBC: 4.03 MIL/uL (ref 3.87–5.11)
RDW: 12.4 % (ref 11.5–15.5)
WBC: 7.5 10*3/uL (ref 4.0–10.5)

## 2014-04-16 LAB — COMPREHENSIVE METABOLIC PANEL
ALBUMIN: 4.2 g/dL (ref 3.5–5.2)
ALT: 19 U/L (ref 0–35)
AST: 20 U/L (ref 0–37)
Alkaline Phosphatase: 120 U/L — ABNORMAL HIGH (ref 39–117)
BUN: 4 mg/dL — AB (ref 6–23)
CO2: 24 mEq/L (ref 19–32)
Calcium: 9.5 mg/dL (ref 8.4–10.5)
Chloride: 104 mEq/L (ref 96–112)
Creatinine, Ser: 0.68 mg/dL (ref 0.50–1.10)
GFR calc Af Amer: >90 mL/min (ref >90)
GFR calc non Af Amer: >90 mL/min (ref >90)
Glucose, Bld: 103 mg/dL — ABNORMAL HIGH (ref 70–99)
Potassium: 3.9 mEq/L (ref 3.7–5.3)
Sodium: 143 mEq/L (ref 137–147)
TOTAL PROTEIN: 7.6 g/dL (ref 6.0–8.3)
Total Bilirubin: <0.2 mg/dL — ABNORMAL LOW (ref 0.3–1.2)

## 2014-04-16 MED ORDER — DIPHENHYDRAMINE HCL 50 MG/ML IJ SOLN
50.0000 mg | Freq: Once | INTRAMUSCULAR | Status: AC
  Administered 2014-04-16: 50 mg via INTRAVENOUS
  Filled 2014-04-16: qty 1

## 2014-04-16 MED ORDER — PROMETHAZINE HCL 25 MG/ML IJ SOLN
25.0000 mg | Freq: Once | INTRAMUSCULAR | Status: AC
  Administered 2014-04-16: 25 mg via INTRAVENOUS
  Filled 2014-04-16: qty 1

## 2014-04-16 MED ORDER — SODIUM CHLORIDE 0.9 % IV BOLUS (SEPSIS)
1000.0000 mL | Freq: Once | INTRAVENOUS | Status: AC
  Administered 2014-04-16: 1000 mL via INTRAVENOUS

## 2014-04-16 NOTE — ED Notes (Signed)
Bed: [facility]03 Expected date:  Expected time:  Means of arrival:  Comments: EMS- Chronic pain, n/v

## 2014-04-16 NOTE — Discharge Instructions (Signed)
Paresthesia °Paresthesia is a burning or prickling feeling. This feeling can happen in any part of the body. It often happens in the hands, arms, legs, or feet. °HOME CARE °· Avoid drinking alcohol. °· Try massage or needle therapy (acupuncture) to help with your problems. °· Keep all doctor visits as told. °GET HELP RIGHT AWAY IF:  °· You feel weak. °· You have trouble walking or moving. °· You have problems speaking or seeing. °· You feel confused. °· You cannot control when you poop (bowel movement) or pee (urinate). °· You lose feeling (numbness) after an injury. °· You pass out (faint). °· Your burning or prickling feeling gets worse when you walk. °· You have pain, cramps, or feel dizzy. °· You have a rash. °MAKE SURE YOU:  °· Understand these instructions. °· Will watch your condition. °· Will get help right away if you are not doing well or get worse. °Document Released: 11/29/2008 Document Revised: 03/10/2012 Document Reviewed: 09/07/2011 °Exit[employer]® Patient Information ©2014 [employer]. ° °

## 2014-04-16 NOTE — ED Notes (Signed)
Pt presented by EMS, reports of being called because she was convulsing, on getting to pt's house c/o generalized pain and chronic nerve pain. Pt complained of nausea and received 4mg  Zofran IV with no relief. Pt complain of acute pain at this moment.

## 2014-04-16 NOTE — ED Provider Notes (Addendum)
CSN: [id number]     Arrival date & time 04/16/14  1628 History   First MD Initiated Contact with Patient 04/16/14 1709     Chief Complaint  Patient presents with  . Generalized Body Aches     (Consider location/radiation/quality/duration/timing/severity/associated sxs/prior Treatment) HPI Patient presents today complaining of "nerve pain". She states that it is all over her body. Should she has been having problems with this since diagnosis of Lyme disease and is taking her medicines as prescribed. She states that she has a history of gastroparesis and she has times when she has stooling with whole pills in place. She states she has a primary care physician in [place] and also has a specialist for her Lyme disease in [place]. She states she's been having some chills but has not had any fever. She has sometimes has nausea and vomiting but this has not been worse than usual. She has been seen on multiple occasions for her chronic abdominal pain. Past Medical History  Diagnosis Date  . Lyme disease   . Interstitial cystitis   . Bile salt-induced diarrhea   . GERD (gastroesophageal reflux disease)   . Bile reflux esophagitis   . Fibromyalgia   . Anemia, iron deficiency 02/05/2013  . Seizure   . IBS (irritable bowel syndrome)   . Gastroparesis   . Chronic abdominal pain   . Migraine headache   . Asthma   . GERD (gastroesophageal reflux disease)   . Nausea vomiting and diarrhea     recurrent  . Kidney stones    Past Surgical History  Procedure Laterality Date  . Cholecystectomy    . Port-a-cath removal    . Sinusotomy    . Exploratory laparotomy     History reviewed. No pertinent family history. History  Substance Use Topics  . Smoking status: Former Smoker    Quit date: 01/16/2003  . Smokeless tobacco: Never Used  . Alcohol Use: No   OB History   Grav Para Term Preterm Abortions TAB SAB Ect Mult Living                 Review of Systems  Constitutional: Positive  for chills.  HENT: Negative.   Eyes: Negative.   Respiratory: Negative.   Cardiovascular: Negative.   Gastrointestinal: Positive for abdominal pain and abdominal distention.  Endocrine: Positive for heat intolerance.  Musculoskeletal: Negative.   Skin: Negative.   Allergic/Immunologic: Negative.   Neurological: Negative.   Hematological: Negative.   Psychiatric/Behavioral: Negative.       Allergies  Doxycycline; Ibuprofen; Lactose intolerance (gi); and Septra  Home Medications   Prior to Admission medications   Medication Sig Start Date End Date Taking? Authorizing Provider  buPROPion (WELLBUTRIN XL) 300 MG 24 hr tablet Take 300 mg by mouth every morning.   Yes Historical Provider, MD  cimetidine (TAGAMET) 200 MG tablet Take 200 mg by mouth at bedtime.   Yes Historical Provider, MD  clonazePAM (KLONOPIN) 1 MG tablet Take 1 mg by mouth 4 (four) times daily as needed for anxiety.    Yes Historical Provider, MD  colesevelam (WELCHOL) 625 MG tablet Take 625 mg by mouth 2 (two) times daily.    Yes Historical Provider, MD  diazepam (VALIUM) 5 MG tablet Take 5-15 mg by mouth 3 (three) times daily as needed for anxiety (seizure).    Yes Historical Provider, MD  diphenoxylate-atropine (LOMOTIL) 2.5-0.025 MG per tablet Take 2 tablets by mouth 4 (four) times daily as needed (IBS).  Yes Historical Provider, MD  dronabinol (MARINOL) 10 MG capsule Take 10 mg by mouth 3 (three) times daily as needed.   Yes Historical Provider, MD  esomeprazole (NEXIUM) 20 MG capsule Take 20 mg by mouth every morning.    Yes Historical Provider, MD  HYDROcodone-acetaminophen (NORCO) 10-325 MG per tablet Take 1 tablet by mouth every 6 (six) hours as needed for moderate pain or severe pain.    Yes Historical Provider, MD  hydrocortisone (CORTEF) 5 MG tablet Take 5 mg by mouth daily.   Yes Historical Provider, MD  hydrOXYzine (ATARAX/VISTARIL) 25 MG tablet Take 25-50 mg by mouth every evening.    Yes Historical  Provider, MD  L-Methylfolate (DEPLIN) 15 MG TABS Take 15 mg by mouth every evening.    Yes Historical Provider, MD  lamoTRIgine (LAMICTAL) 100 MG tablet Take 200 mg by mouth 2 (two) times daily.   Yes Historical Provider, MD  levonorgestrel-ethinyl estradiol (SEASONALE) 0.15-0.03 MG tablet Take 1 tablet by mouth every evening.    Yes Historical Provider, MD  loratadine (CLARITIN) 10 MG tablet Take 10 mg by mouth daily as needed for allergies.   Yes Historical Provider, MD  metoCLOPramide (REGLAN) 10 MG tablet Take 10 mg by mouth 2 (two) times daily.   Yes Historical Provider, MD  OXcarbazepine (TRILEPTAL) 150 MG tablet Take 300 mg by mouth 2 (two) times daily.    Yes Historical Provider, MD  pentosan polysulfate (ELMIRON) 100 MG capsule Take 200 mg by mouth 2 (two) times daily.   Yes Historical Provider, MD  pregabalin (LYRICA) 25 MG capsule Take 25 mg by mouth every evening.   Yes Historical Provider, MD  Probiotic Product (PROBIOTIC DAILY PO) Take 1 capsule by mouth as needed (when any meals are consumed).    Yes Historical Provider, MD  promethazine (PHENERGAN) 25 MG suppository Place 1 suppository (25 mg total) rectally every 6 (six) hours as needed for nausea or vomiting. 04/12/14  Yes Junius FinnerErin O'Malley, PA-C  promethazine (PHENERGAN) 25 MG tablet Take 25 mg by mouth 3 (three) times daily as needed for nausea or vomiting.  10/31/12  Yes Vida RollerBrian D Miller, MD  QUEtiapine (SEROQUEL) 25 MG tablet Take 25 mg by mouth at bedtime as needed (sleep).   Yes Historical Provider, MD  venlafaxine XR (EFFEXOR-XR) 75 MG 24 hr capsule Take 225 mg by mouth every morning.    Yes Historical Provider, MD   BP 146/101  Pulse 112  Temp(Src) 98.6 F (37 C) (Oral)  Resp 16  SpO2 100% Physical Exam  Nursing note and vitals reviewed. Constitutional: She is oriented to person, place, and time. She appears well-developed and well-nourished. She appears distressed.  HENT:  Head: Normocephalic and atraumatic.  Right Ear:  External ear normal.  Left Ear: External ear normal.  Nose: Nose normal.  Mouth/Throat: Oropharynx is clear and moist.  Eyes: Conjunctivae and EOM are normal. Pupils are equal, round, and reactive to light.  Neck: Normal range of motion. Neck supple.  Cardiovascular: Normal rate, regular rhythm, normal heart sounds and intact distal pulses.   Pulmonary/Chest: Effort normal and breath sounds normal.  Abdominal: Soft. Bowel sounds are normal.  Musculoskeletal: Normal range of motion.  Neurological: She is alert and oriented to person, place, and time. She has normal reflexes.  Skin: Skin is warm and dry.  Psychiatric: She has a normal mood and affect. Her behavior is normal. Thought content normal.  Patient is up pacing room during my exam.    ED Course  Procedures (including critical care time) Labs Review Labs Reviewed  CBC WITH DIFFERENTIAL - Abnormal; Notable for the following:    Hemoglobin 11.9 (*)    HCT 35.7 (*)    All other components within normal limits  COMPREHENSIVE METABOLIC PANEL - Abnormal; Notable for the following:    Glucose, Bld 103 (*)    BUN 4 (*)    Alkaline Phosphatase 120 (*)    Total Bilirubin <0.2 (*)    All other components within normal limits    Imaging Review No results found.   EKG Interpretation None      MDM   Final diagnoses:  Paresthesia  Chronic pain    Patient with IV hydration, Phenergan, and Benadryl here. She feels somewhat improved. Her labs are essentially normal the exception of her LDH elevated which is has been noted on previous exams here. She is advised to followup with her primary care for recheck. She is given return precautions and followup instructions and voices understanding.  Hilario Quarry, MD 04/16/14 1910  Hilario Quarry, MD 04/28/14 [phone]

## 2014-04-17 ENCOUNTER — Emergency Department (HOSPITAL_BASED_OUTPATIENT_CLINIC_OR_DEPARTMENT_OTHER)
Admission: EM | Admit: 2014-04-17 | Discharge: 2014-04-17 | Disposition: A | Discharge status: Home / Self Care | Payer: BC Managed Care – PPO | Attending: Emergency Medicine | Admitting: Emergency Medicine

## 2014-04-17 ENCOUNTER — Encounter (HOSPITAL_BASED_OUTPATIENT_CLINIC_OR_DEPARTMENT_OTHER): Payer: Self-pay | Admitting: Emergency Medicine

## 2014-04-17 DIAGNOSIS — G43909 Migraine, unspecified, not intractable, without status migrainosus: Secondary | ICD-10-CM | POA: Insufficient documentation

## 2014-04-17 DIAGNOSIS — K589 Irritable bowel syndrome without diarrhea: Secondary | ICD-10-CM | POA: Insufficient documentation

## 2014-04-17 DIAGNOSIS — K219 Gastro-esophageal reflux disease without esophagitis: Secondary | ICD-10-CM | POA: Insufficient documentation

## 2014-04-17 DIAGNOSIS — Z79899 Other long term (current) drug therapy: Secondary | ICD-10-CM | POA: Insufficient documentation

## 2014-04-17 DIAGNOSIS — R5381 Other malaise: Secondary | ICD-10-CM | POA: Insufficient documentation

## 2014-04-17 DIAGNOSIS — Z87891 Personal history of nicotine dependence: Secondary | ICD-10-CM | POA: Insufficient documentation

## 2014-04-17 DIAGNOSIS — F411 Generalized anxiety disorder: Secondary | ICD-10-CM | POA: Insufficient documentation

## 2014-04-17 DIAGNOSIS — T450X5A Adverse effect of antiallergic and antiemetic drugs, initial encounter: Secondary | ICD-10-CM | POA: Insufficient documentation

## 2014-04-17 DIAGNOSIS — R358 Other polyuria: Secondary | ICD-10-CM

## 2014-04-17 DIAGNOSIS — Z87442 Personal history of urinary calculi: Secondary | ICD-10-CM | POA: Insufficient documentation

## 2014-04-17 DIAGNOSIS — T887XXA Unspecified adverse effect of drug or medicament, initial encounter: Secondary | ICD-10-CM

## 2014-04-17 DIAGNOSIS — J45909 Unspecified asthma, uncomplicated: Secondary | ICD-10-CM | POA: Insufficient documentation

## 2014-04-17 DIAGNOSIS — G8929 Other chronic pain: Secondary | ICD-10-CM | POA: Insufficient documentation

## 2014-04-17 DIAGNOSIS — G40909 Epilepsy, unspecified, not intractable, without status epilepticus: Secondary | ICD-10-CM | POA: Insufficient documentation

## 2014-04-17 DIAGNOSIS — IMO0002 Reserved for concepts with insufficient information to code with codable children: Secondary | ICD-10-CM | POA: Insufficient documentation

## 2014-04-17 DIAGNOSIS — R5383 Other fatigue: Secondary | ICD-10-CM

## 2014-04-17 DIAGNOSIS — Z3202 Encounter for pregnancy test, result negative: Secondary | ICD-10-CM | POA: Insufficient documentation

## 2014-04-17 DIAGNOSIS — K3184 Gastroparesis: Secondary | ICD-10-CM | POA: Insufficient documentation

## 2014-04-17 DIAGNOSIS — Z8619 Personal history of other infectious and parasitic diseases: Secondary | ICD-10-CM | POA: Insufficient documentation

## 2014-04-17 DIAGNOSIS — R3589 Other polyuria: Secondary | ICD-10-CM | POA: Insufficient documentation

## 2014-04-17 DIAGNOSIS — R110 Nausea: Secondary | ICD-10-CM | POA: Insufficient documentation

## 2014-04-17 DIAGNOSIS — Z862 Personal history of diseases of the blood and blood-forming organs and certain disorders involving the immune mechanism: Secondary | ICD-10-CM | POA: Insufficient documentation

## 2014-04-17 LAB — CBC WITH DIFFERENTIAL/PLATELET
Basophils Absolute: 0 10*3/uL (ref 0.0–0.1)
Basophils Relative: 0 % (ref 0–1)
EOS PCT: 2 % (ref 0–5)
Eosinophils Absolute: 0.1 10*3/uL (ref 0.0–0.7)
HEMATOCRIT: 36 % (ref 36.0–46.0)
Hemoglobin: 12 g/dL (ref 12.0–15.0)
LYMPHS ABS: 2.9 10*3/uL (ref 0.7–4.0)
LYMPHS PCT: 43 % (ref 12–46)
MCH: 30.7 pg (ref 26.0–34.0)
MCHC: 33.3 g/dL (ref 30.0–36.0)
MCV: 92.1 fL (ref 78.0–100.0)
MONO ABS: 0.5 10*3/uL (ref 0.1–1.0)
MONOS PCT: 7 % (ref 3–12)
Neutro Abs: 3.2 10*3/uL (ref 1.7–7.7)
Neutrophils Relative %: 48 % (ref 43–77)
Platelets: 367 10*3/uL (ref 150–400)
RBC: 3.91 MIL/uL (ref 3.87–5.11)
RDW: 12.4 % (ref 11.5–15.5)
WBC: 6.6 10*3/uL (ref 4.0–10.5)

## 2014-04-17 LAB — URINALYSIS, ROUTINE W REFLEX MICROSCOPIC
Bilirubin Urine: NEGATIVE
Glucose, UA: NEGATIVE mg/dL
Hgb urine dipstick: NEGATIVE
KETONES UR: NEGATIVE mg/dL
Leukocytes, UA: NEGATIVE
NITRITE: NEGATIVE
PH: 7 (ref 5.0–8.0)
Protein, ur: NEGATIVE mg/dL
Specific Gravity, Urine: 1.009 (ref 1.005–1.030)
UROBILINOGEN UA: 0.2 mg/dL (ref 0.0–1.0)

## 2014-04-17 LAB — COMPREHENSIVE METABOLIC PANEL
ALT: 20 U/L (ref 0–35)
AST: 22 U/L (ref 0–37)
Albumin: 4.4 g/dL (ref 3.5–5.2)
Alkaline Phosphatase: 116 U/L (ref 39–117)
BUN: 4 mg/dL — AB (ref 6–23)
CALCIUM: 10 mg/dL (ref 8.4–10.5)
CO2: 25 meq/L (ref 19–32)
CREATININE: 0.7 mg/dL (ref 0.50–1.10)
Chloride: 104 mEq/L (ref 96–112)
GFR calc non Af Amer: >90 mL/min (ref >90)
Glucose, Bld: 113 mg/dL — ABNORMAL HIGH (ref 70–99)
Potassium: 3.8 mEq/L (ref 3.7–5.3)
Sodium: 143 mEq/L (ref 137–147)
Total Bilirubin: <0.2 mg/dL — ABNORMAL LOW (ref 0.3–1.2)
Total Protein: 7.7 g/dL (ref 6.0–8.3)

## 2014-04-17 LAB — OSMOLALITY: Osmolality: 287 mOsm/kg (ref 275–300)

## 2014-04-17 LAB — PREGNANCY, URINE: Preg Test, Ur: NEGATIVE

## 2014-04-17 LAB — LIPASE, BLOOD: LIPASE: 11 U/L (ref 11–59)

## 2014-04-17 LAB — MAGNESIUM: Magnesium: 1.8 mg/dL (ref 1.5–2.5)

## 2014-04-17 MED ORDER — ONDANSETRON HCL 4 MG/2ML IJ SOLN
4.0000 mg | Freq: Once | INTRAMUSCULAR | Status: AC
Start: 2014-04-17 — End: 2014-04-17
  Administered 2014-04-17: 4 mg via INTRAVENOUS
  Filled 2014-04-17: qty 2

## 2014-04-17 MED ORDER — LORAZEPAM 2 MG/ML IJ SOLN
2.0000 mg | INTRAMUSCULAR | Status: AC | PRN
  Administered 2014-04-17 (×2): 2 mg via INTRAVENOUS
  Filled 2014-04-17 (×2): qty 1

## 2014-04-17 MED ORDER — SCOPOLAMINE 1 MG/3DAYS TD PT72
1.0000 | MEDICATED_PATCH | TRANSDERMAL | Status: DC
  Filled 2014-04-17: qty 1

## 2014-04-17 MED ORDER — BENZTROPINE MESYLATE 1 MG/ML IJ SOLN
2.0000 mg | Freq: Once | INTRAMUSCULAR | Status: AC
  Administered 2014-04-17: 2 mg via INTRAMUSCULAR
  Filled 2014-04-17: qty 2

## 2014-04-17 MED ORDER — PROMETHAZINE HCL 12.5 MG PO TABS: 12.5000 mg | ORAL_TABLET | Freq: Four times a day (QID) | ORAL | Status: DC | PRN

## 2014-04-17 MED ORDER — SCOPOLAMINE 1 MG/3DAYS TD PT72: 1.0000 | MEDICATED_PATCH | TRANSDERMAL | Status: DC

## 2014-04-17 MED ORDER — BENZTROPINE MESYLATE 1 MG PO TABS: 1.0000 mg | ORAL_TABLET | Freq: Two times a day (BID) | ORAL | Status: DC | PRN

## 2014-04-17 MED ORDER — SODIUM CHLORIDE 0.9 % IV BOLUS (SEPSIS)
1000.0000 mL | Freq: Once | INTRAVENOUS | Status: AC
Start: 2014-04-17 — End: 2014-04-17
  Administered 2014-04-17: 1000 mL via INTRAVENOUS

## 2014-04-17 MED ORDER — LORAZEPAM 2 MG/ML IJ SOLN
1.0000 mg | Freq: Once | INTRAMUSCULAR | Status: AC
Start: 2014-04-17 — End: 2014-04-17
  Administered 2014-04-17: 1 mg via INTRAVENOUS
  Filled 2014-04-17: qty 1

## 2014-04-17 MED ORDER — LORAZEPAM 1 MG PO TABS: 1.0000 mg | ORAL_TABLET | Freq: Three times a day (TID) | ORAL | Status: DC | PRN

## 2014-04-17 MED ORDER — DIPHENHYDRAMINE HCL 50 MG/ML IJ SOLN
25.0000 mg | Freq: Once | INTRAMUSCULAR | Status: AC
Start: 2014-04-17 — End: 2014-04-17
  Administered 2014-04-17: 25 mg via INTRAVENOUS
  Filled 2014-04-17: qty 1

## 2014-04-17 NOTE — ED Notes (Addendum)
At the end of the triage, both the mother and the patient were crying.  Emotional instability is present.

## 2014-04-17 NOTE — ED Notes (Signed)
Pt and family arguing regarding history of patient.  Pt states she was diagnosed with gastroparesis.  Pt states she started to take Reglan last week when the nervous problem started with her legs.  Pt admits that she has not called her PCP regarding the new symptoms.  Mother is interrupting conversation while talking to patient.

## 2014-04-17 NOTE — ED Provider Notes (Signed)
CSN: [id number]     Arrival date & time 04/17/14  1353 History  This chart was scribed for [name], MD by Ardelia Memsylan Malpass, ED Scribe. This patient was seen in room MH07/MH07 and the patient's care was started at 3:10 PM.   Chief Complaint  Patient presents with  . Abdominal Pain    The history is provided by the patient. No language interpreter was used.    HPI Comments: Dana CoffinHolly M Lozano is a 33 y.o. female with a history of chronic lyme disease (diagnosed in 2002), gastroparesis, GERD and IBS who presents to the Emergency Department complaining of intermittent, moderate pain with associated weakness in her bilateral legs onset 4-5 days ago. She states that this pain and weakness is equally present in both of her legs. 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 She states that she does not normally have the bilateral leg pain and weakness which she has had over the past several days. She states that she began taking Reglan earlier in the week, which she believes to be related to her leg pain. She reports associated symptoms of feeling "shaky and jittery all over". She further describes that "my nerves are worn out". She also reports feeling clammy and nauseated. She states that Phenergan usually helps with her nausea. She denies any emesis or back pain. She states that she has had MS ruled out in the past.  She also states that she has a history of gastroparesis, and reports a secondary complaint of generalized abdominal pain over the past few days. She states that she has been producing stools normally. She states that she has had increased urinary frequency over the past few days.   Past Medical History  Diagnosis Date  . Lyme disease   . Interstitial cystitis   . Bile salt-induced diarrhea   . GERD (gastroesophageal reflux disease)   . Bile reflux esophagitis   . Fibromyalgia   . Anemia, iron  deficiency 02/05/2013  . Seizure   . IBS (irritable bowel syndrome)   . Gastroparesis   . Chronic abdominal pain   . Migraine headache   . Asthma   . GERD (gastroesophageal reflux disease)   . Nausea vomiting and diarrhea     recurrent  . Kidney stones    Past Surgical History  Procedure Laterality Date  . Cholecystectomy    . Port-a-cath removal    . Sinusotomy    . Exploratory laparotomy     Family History  Problem Relation Age of Onset  . Hypertension Father   . Mental illness Father     depression   History  Substance Use Topics  . Smoking status: Former Smoker    Quit date: 01/16/2003  . Smokeless tobacco: Never Used  . Alcohol Use: No   OB History   Grav Para Term Preterm Abortions TAB SAB Ect Mult Living                 Review of Systems  Constitutional: Negative for fever, chills, diaphoresis, appetite change and fatigue.  HENT: Negative for mouth sores, sore throat and trouble swallowing.   Eyes: Negative for visual disturbance.  Respiratory: Negative for cough, chest tightness, shortness of breath and wheezing.   Cardiovascular: Negative for chest pain.  Gastrointestinal: Positive for nausea and abdominal pain. Negative for vomiting, diarrhea and abdominal distention.  Endocrine: Negative for polydipsia, polyphagia and polyuria.  Genitourinary: Positive  for frequency. Negative for dysuria and hematuria.  Musculoskeletal: Negative for back pain and gait problem.       Bilateral leg pain  Skin: Negative for color change, pallor and rash.  Neurological: Positive for weakness (bilateral legs). Negative for dizziness, syncope, light-headedness and headaches.  Hematological: Does not bruise/bleed easily.  Psychiatric/Behavioral: Negative for behavioral problems and confusion. The patient is nervous/anxious.     Allergies  Doxycycline; Ibuprofen; Lactose intolerance (gi); and Septra  Home Medications   Prior to Admission medications   Medication Sig Start  Date End Date Taking? Authorizing Provider  buPROPion (WELLBUTRIN XL) 300 MG 24 hr tablet Take 300 mg by mouth every morning.    Historical Provider, MD  cimetidine (TAGAMET) 200 MG tablet Take 200 mg by mouth at bedtime.    Historical Provider, MD  clonazePAM (KLONOPIN) 1 MG tablet Take 1 mg by mouth 4 (four) times daily as needed for anxiety.     Historical Provider, MD  colesevelam (WELCHOL) 625 MG tablet Take 625 mg by mouth 2 (two) times daily.     Historical Provider, MD  diazepam (VALIUM) 5 MG tablet Take 5-15 mg by mouth 3 (three) times daily as needed for anxiety (seizure).     Historical Provider, MD  diphenoxylate-atropine (LOMOTIL) 2.5-0.025 MG per tablet Take 2 tablets by mouth 4 (four) times daily as needed (IBS).     Historical Provider, MD  dronabinol (MARINOL) 10 MG capsule Take 10 mg by mouth 3 (three) times daily as needed.    Historical Provider, MD  esomeprazole (NEXIUM) 20 MG capsule Take 20 mg by mouth every morning.     Historical Provider, MD  HYDROcodone-acetaminophen (NORCO) 10-325 MG per tablet Take 1 tablet by mouth every 6 (six) hours as needed for moderate pain or severe pain.     Historical Provider, MD  hydrocortisone (CORTEF) 5 MG tablet Take 5 mg by mouth daily.    Historical Provider, MD  hydrOXYzine (ATARAX/VISTARIL) 25 MG tablet Take 25-50 mg by mouth every evening.     Historical Provider, MD  L-Methylfolate (DEPLIN) 15 MG TABS Take 15 mg by mouth every evening.     Historical Provider, MD  lamoTRIgine (LAMICTAL) 100 MG tablet Take 200 mg by mouth 2 (two) times daily.    Historical Provider, MD  levonorgestrel-ethinyl estradiol (SEASONALE) 0.15-0.03 MG tablet Take 1 tablet by mouth every evening.     Historical Provider, MD  loratadine (CLARITIN) 10 MG tablet Take 10 mg by mouth daily as needed for allergies.    Historical Provider, MD  metoCLOPramide (REGLAN) 10 MG tablet Take 10 mg by mouth 2 (two) times daily.    Historical Provider, MD  OXcarbazepine  (TRILEPTAL) 150 MG tablet Take 300 mg by mouth 2 (two) times daily.     Historical Provider, MD  pentosan polysulfate (ELMIRON) 100 MG capsule Take 200 mg by mouth 2 (two) times daily.    Historical Provider, MD  pregabalin (LYRICA) 25 MG capsule Take 25 mg by mouth every evening.    Historical Provider, MD  Probiotic Product (PROBIOTIC DAILY PO) Take 1 capsule by mouth as needed (when any meals are consumed).     Historical Provider, MD  promethazine (PHENERGAN) 25 MG suppository Place 1 suppository (25 mg total) rectally every 6 (six) hours as needed for nausea or vomiting. 04/12/14   Junius Finner, PA-C  promethazine (PHENERGAN) 25 MG tablet Take 25 mg by mouth 3 (three) times daily as needed for nausea or vomiting.  10/31/12  Vida RollerBrian D Miller, MD  QUEtiapine (SEROQUEL) 25 MG tablet Take 25 mg by mouth at bedtime as needed (sleep).    Historical Provider, MD  venlafaxine XR (EFFEXOR-XR) 75 MG 24 hr capsule Take 225 mg by mouth every morning.     Historical Provider, MD   Triage Vitals: BP 177/100  Pulse 99  Temp(Src) 99 F (37.2 C) (Oral)  Resp 24  Ht 5\' 8"  (1.727 m)  Wt 200 lb (90.719 kg)  BMI 30.42 kg/m2  SpO2 98%  Physical Exam  Nursing note and vitals reviewed. Constitutional: She is oriented to person, place, and time. She appears well-developed and well-nourished. No distress.  HENT:  Head: Normocephalic.  Eyes: Conjunctivae are normal. Pupils are equal, round, and reactive to light. No scleral icterus.  Neck: Normal range of motion. Neck supple. No thyromegaly present.  Cardiovascular: Normal rate and regular rhythm.  Exam reveals no gallop and no friction rub.   No murmur heard. Pulmonary/Chest: Effort normal and breath sounds normal. No respiratory distress. She has no wheezes. She has no rales.  Abdominal: Soft. Bowel sounds are normal. She exhibits no distension. There is no tenderness. There is no rebound.  Musculoskeletal: Normal range of motion.  Neurological: She is  alert and oriented to person, place, and time.  Skin: Skin is warm and dry. No rash noted.  Psychiatric: Her behavior is normal.  Anxiuus, crying, but oriented and lucid    ED Course  Procedures (including critical care time)  DIAGNOSTIC STUDIES: Oxygen Saturation is 98% on RA, normal by my interpretation.    COORDINATION OF CARE: 3:20 PM- Discussed plan to obtain diagnostic lab work. Will also order medications. Pt advised of plan for treatment and pt agrees.  Medications  ondansetron (ZOFRAN) injection 4 mg (4 mg Intravenous Given 04/17/14 1546)  diphenhydrAMINE (BENADRYL) injection 25 mg (25 mg Intravenous Given 04/17/14 1546)  LORazepam (ATIVAN) injection 2 mg (2 mg Intravenous Given 04/17/14 1727)  benztropine mesylate (COGENTIN) injection 2 mg (2 mg Intramuscular Given 04/17/14 1544)  sodium chloride 0.9 % bolus 1,000 mL (0 mLs Intravenous Stopped 04/17/14 2013)  LORazepam (ATIVAN) injection 1 mg (1 mg Intravenous Given 04/17/14 2011)   Labs Review Labs Reviewed  COMPREHENSIVE METABOLIC PANEL - Abnormal; Notable for the following:    Glucose, Bld 113 (*)    BUN 4 (*)    Total Bilirubin <0.2 (*)    All other components within normal limits  PREGNANCY, URINE  URINALYSIS, ROUTINE W REFLEX MICROSCOPIC  CBC WITH DIFFERENTIAL  MAGNESIUM  LIPASE, BLOOD  OSMOLALITY    Imaging Review No results found.   EKG Interpretation None      MDM   Final diagnoses:  Medication side effect  Polyuria  Gastroparesis    Medicated. Stabilized. Prefers outpatient treatment. Will hold her new medications. Followup with her ongoing physicians.   Medical screening examination/treatment/procedure(s) were conducted as a shared visit with non-physician practitioner(s) and myself.  I personally evaluated the patient during the encounter.   EKG Interpretation None        Rolland PorterMark Caroly Purewal, MD 04/21/14 0230

## 2014-04-17 NOTE — Discharge Instructions (Signed)
Gastroparesis  Gastroparesis is also called slowed stomach emptying (delayed gastric emptying). It is a condition in which the stomach takes too long to empty its contents. It often happens in people with diabetes.  CAUSES  Gastroparesis happens when nerves to the stomach are damaged or stop working. When the nerves are damaged, the muscles of the stomach and intestines do not work normally. The movement of food is slowed or stopped. High blood glucose (sugar) causes changes in nerves and can damage the blood vessels that carry oxygen and nutrients to the nerves. RISK FACTORS  Diabetes.  Post-viral syndromes.  Eating disorders (anorexia, bulimia).  Surgery on the stomach or vagus nerve.  Gastroesophageal reflux disease (rarely).  Smooth muscle disorders (amyloidosis, scleroderma).  Metabolic disorders, including hypothyroidism.  Parkinson disease. SYMPTOMS   Heartburn.  Feeling sick to your stomach (nausea).  Vomiting of undigested food.  An early feeling of fullness when eating.  Weight loss.  Abdominal bloating.  Erratic blood glucose levels.  Lack of appetite.  Gastroesophageal reflux.  Spasms of the stomach wall. Complications can include:  Bacterial overgrowth in stomach. Food stays in the stomach and can ferment and cause bacteria to grow.  Weight loss due to difficulty digesting and absorbing nutrients.  Vomiting.  Obstruction in the stomach. Undigested food can harden and cause nausea and vomiting.  Blood glucose fluctuations caused by inconsistent food absorption. DIAGNOSIS  The diagnosis of gastroparesis is confirmed through one or more of the following tests:  Barium X-rays and scans. These tests look at how long it takes for food to move through the stomach.  Gastric manometry. This test measures electrical and muscular activity in the stomach. A thin tube is passed down the throat into the stomach. The tube contains a wire that takes measurements  of the stomach's electrical and muscular activity as it digests liquids and solid food.  Endoscopy. This procedure is done with a long, thin tube called an endoscope. It is passed through the mouth and gently down the esophagus into the stomach. This tube helps the caregiver look at the lining of the stomach to check for any abnormalities.  Ultrasonography. This can rule out gallbladder disease or pancreatitis. This test will outline and define the shape of the gallbladder and pancreas. TREATMENT   Treatments may include:  Exercise.  Medicines to control nausea and vomiting.  Medicines to stimulate stomach muscles.  Changes in what and when you eat.  Having smaller meals more often.  Eating low-fiber forms of high-fiber foods, such as eating cooked vegetables instead of raw vegetables.  Eating low-fat foods.  Consuming liquids, which are easier to digest.  In severe cases, feeding tubes and intravenous (IV) feeding may be needed. It is important to note that in most cases, treatment does not cure gastroparesis. It is usually a lasting (chronic) condition. Treatment helps you manage the underlying condition so that you can be as healthy and comfortable as possible. Other treatments  A gastric neurostimulator has been developed to assist people with gastroparesis. The battery-operated device is surgically implanted. It emits mild electrical pulses to help improve stomach emptying and to control nausea and vomiting.  The use of botulinum toxin has been shown to improve stomach emptying by decreasing the prolonged contractions of the muscle between the stomach and the small intestine (pyloric sphincter). The benefits are temporary. SEEK MEDICAL CARE IF:   You have diabetes and you are having problems keeping your blood glucose in goal range.  You are having nausea,   vomiting, bloating, or early feelings of fullness with eating.  Your symptoms do not change with a change in  diet. Document Released: 12/17/2005 Document Revised: 04/13/2013 Document Reviewed: 05/26/2009 [employer] Patient Information 2014 [employer].  

## 2014-04-17 NOTE — ED Notes (Signed)
MD at bedside with bedside ultrasound. 

## 2014-04-17 NOTE — ED Notes (Signed)
It was very difficult to obtain an HPI and Chief Complaint on this patient as she is non specific and mother unable to provide a straightforward history.    History of Lyme Disease (Chronic).  Had an endoscopy in January for eating difficulties.  No acute findings.  Patient has multiple physicians that is managing her care ([place] in [place]., [facility], in the process of switching because she didn't like one of them).  Patient has been on a 'gastroparesis' diet.  Mother feels that her care has been prolonged and she is having difficulty with 'nerve' issues, urinating on herself, and difficulty eating.  Patient is quivering in her wheelchair c/o 'nerve pain' and has been urinating on herself.

## 2014-04-19 ENCOUNTER — Ambulatory Visit (INDEPENDENT_AMBULATORY_CARE_PROVIDER_SITE_OTHER): Payer: BC Managed Care – PPO | Admitting: Physician Assistant

## 2014-04-19 VITALS — BP 120/82 | HR 95 | Temp 98.5°F | Resp 16 | Ht 65.5 in | Wt 189.8 lb

## 2014-04-19 DIAGNOSIS — K589 Irritable bowel syndrome without diarrhea: Secondary | ICD-10-CM

## 2014-04-19 DIAGNOSIS — R569 Unspecified convulsions: Secondary | ICD-10-CM

## 2014-04-19 DIAGNOSIS — G259 Extrapyramidal and movement disorder, unspecified: Secondary | ICD-10-CM

## 2014-04-19 DIAGNOSIS — R109 Unspecified abdominal pain: Secondary | ICD-10-CM

## 2014-04-19 DIAGNOSIS — N3010 Interstitial cystitis (chronic) without hematuria: Secondary | ICD-10-CM

## 2014-04-19 DIAGNOSIS — A6920 Lyme disease, unspecified: Secondary | ICD-10-CM

## 2014-04-19 DIAGNOSIS — D509 Iron deficiency anemia, unspecified: Secondary | ICD-10-CM

## 2014-04-19 DIAGNOSIS — R197 Diarrhea, unspecified: Secondary | ICD-10-CM

## 2014-04-19 MED ORDER — BENZTROPINE MESYLATE 1 MG PO TABS: 1.0000 mg | ORAL_TABLET | Freq: Two times a day (BID) | ORAL | Status: DC | PRN

## 2014-04-19 NOTE — Progress Notes (Signed)
Subjective:    Patient ID: Dana Lozano, female    DOB: 12-01-1981, 33 y.o.   MRN: [id number]   PCP: No primary provider on file. She reportedly has most recently been followed at [facility], but does not intend to return there.  I also do not see any recent visits there in the EMR. On two occasions she has received help here, and indicates that this is where she will be coming in the future.  Chief Complaint  Patient presents with  . nerve pain in both legs    Dr. at [facility] on [address] thinks it is the med Reglan( reaction)     Active Ambulatory Problems    Diagnosis Date Noted  . LYME DISEASE 02/25/2008  . HYPERLIPIDEMIA 03/17/2007  . ANXIETY 02/25/2008  . DEPRESSION 02/25/2008  . MIGRAINE HEADACHE 03/17/2007  . HEMORRHOIDS, EXTERNAL 08/13/2007  . ALLERGIC RHINITIS, SEASONAL 02/25/2008  . ASTHMA 02/25/2008  . ESOPHAGITIS 08/13/2007  . GERD 02/25/2008  . IRRITABLE BOWEL SYNDROME 03/17/2007  . INTERSTITIAL CYSTITIS 03/17/2007  . ARTHRITIS 02/25/2008  . FIBROMYALGIA 03/17/2007  . NAUSEA AND VOMITING 09/25/2010  . DIARRHEA 09/25/2010  . NEPHROLITHIASIS, HX OF 03/17/2007  . Anemia, iron deficiency 02/05/2013   Resolved Ambulatory Problems    Diagnosis Date Noted  . No Resolved Ambulatory Problems   Past Medical History  Diagnosis Date  . Interstitial cystitis   . Bile salt-induced diarrhea   . GERD (gastroesophageal reflux disease)   . Bile reflux esophagitis   . Fibromyalgia   . Seizure   . IBS (irritable bowel syndrome)   . Gastroparesis   . Chronic abdominal pain   . Migraine headache   . Asthma   . GERD (gastroesophageal reflux disease)   . Nausea vomiting and diarrhea   . Kidney stones     Past Surgical History  Procedure Laterality Date  . Cholecystectomy    . Port-a-cath removal    . Sinusotomy    . Exploratory laparotomy      Allergies  Allergen Reactions  . Doxycycline Nausea And Vomiting  . Ibuprofen Swelling  . Lactose  Intolerance (Gi)     Stomach cramps  . Septra [Sulfamethoxazole-Tmp Ds] Other (See Comments)    Severe mouth ulcers, possibly caused seizures    Prior to Admission medications   Medication Sig Start Date End Date Taking? Authorizing Provider  benztropine (COGENTIN) 1 MG tablet Take 1 tablet (1 mg total) by mouth 2 (two) times daily as needed for tremors. 04/19/14 04/22/14 Yes Edsel Shives S Lillieanna Tuohy, PA-C  buPROPion (WELLBUTRIN XL) 300 MG 24 hr tablet Take 300 mg by mouth every morning.   Yes Historical Provider, MD  cimetidine (TAGAMET) 200 MG tablet Take 200 mg by mouth at bedtime.   Yes Historical Provider, MD  clonazePAM (KLONOPIN) 1 MG tablet Take 1 mg by mouth 4 (four) times daily as needed for anxiety.    Yes Historical Provider, MD  diazepam (VALIUM) 5 MG tablet Take 5-15 mg by mouth 3 (three) times daily as needed for anxiety (seizure).    Yes Historical Provider, MD  diphenoxylate-atropine (LOMOTIL) 2.5-0.025 MG per tablet Take 2 tablets by mouth 4 (four) times daily as needed (IBS).    Yes Historical Provider, MD  dronabinol (MARINOL) 10 MG capsule Take 10 mg by mouth 3 (three) times daily as needed.   Yes Historical Provider, MD  esomeprazole (NEXIUM) 20 MG capsule Take 20 mg by mouth every morning.    Yes Historical Provider, MD  HYDROcodone-acetaminophen (NORCO) 10-325 MG per tablet Take 1 tablet by mouth every 6 (six) hours as needed for moderate pain or severe pain.    Yes Historical Provider, MD  hydrocortisone (CORTEF) 5 MG tablet Take 5 mg by mouth daily.   Yes Historical Provider, MD  hydrOXYzine (ATARAX/VISTARIL) 25 MG tablet Take 25-50 mg by mouth every evening.    Yes Historical Provider, MD  L-Methylfolate (DEPLIN) 15 MG TABS Take 15 mg by mouth every evening.    Yes Historical Provider, MD  lamoTRIgine (LAMICTAL) 100 MG tablet Take 200 mg by mouth 2 (two) times daily.   Yes Historical Provider, MD  levonorgestrel-ethinyl estradiol (SEASONALE) 0.15-0.03 MG tablet Take 1 tablet by  mouth every evening.    Yes Historical Provider, MD  LORazepam (ATIVAN) 1 MG tablet Take 1 tablet (1 mg total) by mouth 3 (three) times daily as needed for anxiety. 04/17/14  Yes Rolland Porter, MD  OXcarbazepine (TRILEPTAL) 150 MG tablet Take 300 mg by mouth 2 (two) times daily.    Yes Historical Provider, MD  pentosan polysulfate (ELMIRON) 100 MG capsule Take 200 mg by mouth 2 (two) times daily.   Yes Historical Provider, MD  pregabalin (LYRICA) 25 MG capsule Take 25 mg by mouth every evening.   Yes Historical Provider, MD  Probiotic Product (PROBIOTIC DAILY PO) Take 1 capsule by mouth as needed (when any meals are consumed).    Yes Historical Provider, MD  promethazine (PHENERGAN) 12.5 MG tablet Take 1 tablet (12.5 mg total) by mouth every 6 (six) hours as needed for nausea or vomiting. 04/17/14  Yes Rolland Porter, MD  promethazine (PHENERGAN) 25 MG suppository Place 1 suppository (25 mg total) rectally every 6 (six) hours as needed for nausea or vomiting. 04/12/14  Yes Junius Finner, PA-C  promethazine (PHENERGAN) 25 MG tablet Take 25 mg by mouth 3 (three) times daily as needed for nausea or vomiting.  10/31/12  Yes Vida Roller, MD  QUEtiapine (SEROQUEL) 25 MG tablet Take 25 mg by mouth at bedtime as needed (sleep).   Yes Historical Provider, MD  scopolamine (TRANSDERM-SCOP) 1 MG/3DAYS Place 1 patch (1.5 mg total) onto the skin every 3 (three) days. 04/17/14  Yes Rolland Porter, MD  venlafaxine XR (EFFEXOR-XR) 75 MG 24 hr capsule Take 225 mg by mouth every morning.    Yes Historical Provider, MD  colesevelam (WELCHOL) 625 MG tablet Take 625 mg by mouth 2 (two) times daily.     Historical Provider, MD  loratadine (CLARITIN) 10 MG tablet Take 10 mg by mouth daily as needed for allergies.    Historical Provider, MD  metoCLOPramide (REGLAN) 10 MG tablet Take 10 mg by mouth 2 (two) times daily.    Historical Provider, MD    History   Social History  . Marital Status: Single    Spouse Name: n/a    Number of  Children: 0  . Years of Education: college   Occupational History  . unemployed     denied disability x 2   Social History Main Topics  . Smoking status: Former Smoker    Quit date: 01/16/2003  . Smokeless tobacco: Never Used  . Alcohol Use: No  . Drug Use: No  . Sexual Activity: Not Currently   Other Topics Concern  . None   Social History Narrative   Unable to work due to the neurologic effects of Lyme Disease. Lives alone. Her parents live in [place]. Her parents support her financially.    family  history includes Hypertension in her father; Mental illness in her father. indicated that her mother is alive. She indicated that her father is alive.   HPI This patient has a very complicated history. She presents today to follow-up after several recent ED visits. "I feel like I just want to run." Her mother provides most of the history, and often interrupts me and the patient to provide additional detail or to correct/clarify. Repeatedly, the patient says, "Mom, stop talking," which the mother ignores. It is difficult to ascertain the chronological sequence of events.  She reports worsening diarrhea and abdominal pain x 3 weeks. She was seen 3/24 (seizures and gastroparesis) in [place], and on 4/13 (nausea, vomiting, abdominal pain), 4/17 (generalized body aches, paresthesias) and 4/18 (abdominal pain, medication side effect) in [place].  Those notes are reviewed.   On 4/15, her gastroenterologist called in Reglan 10 mg BID, advised that she stop Welchol and resume a full liquid diet. Labs at her ED visits were essentially normal, and on 4/18 her jitteriness, jumpiness, and feeling the need to move were thought to be due to the Reglan.  It was stopped, and she was given a 3-day supply of Cogentin 1 mg, BID, with instructions that she would need additional days and that she must see her PCP today. Unfortunately, the progress note from that visit is not complete and  there is no explanation as to why it must be continued.  The patient reports that she feels much better on the cogentin, and very much wants to continue it, not only because the was told she'd need to. "It's helped a lot.  I feel like things are evening back out."  She has had Lyme Disease for approximately 10 years, with a very prolonged illness prior to diagnosis and initiation of treatment. She is unable to work, but due to her age, and that she was able to complete college, was denied her application for disability. Her Lyme Disease is managed by Dr. Abner GreenspanJemsek in [place], [place].  She had labs drawn there in late March, but is still waiting for the results. She's to arrange a telephone conference with that office to get and discuss the results. The treatment for Lyme is on hold until she gets the IBS issues resolved. "I'm consumed by my GI problems."  She sees Dr. Evelene CroonKaur and [facility] Fousek, but hasn't followed-up recently.  Has a history of significant iron deficiency anemia, requiring infusion, but hasn't followed-up with hematology (last visit there was 03/2013). She cancelled her visit in 06/2013 due to illness, and never rescheduled. Her mother thought they shouldn't do that until they were advised by Dr. Abner GreenspanJemsek.  Her Interstitial Cystitis is followed by Dr. Logan BoresEvans in [place], and she is to have a study later this week.  Her IBS and GERD is followed in [place] as well.  She hasn't seen her new provider, Dr. Merri BrunetteFina, though he performed an endoscopy on her previously, and ordered the recent gastric emptying study. Her visit is scheduled for 04/28/2014. She has had increased nausea and diarrhea and abdominal pain since last fall. In January, she had an endoscopy which was essentially normal. The presumptive diagnosis of gastroparesis was confirmed with a gastric emptying study on 4/08.  She was previously followed by Dr. Claudette HeadMalcolm Stark at [facility], but was dismissed from that practice after a  hospitalization under the care of another physician in the same practice.  Apparently the mother and the physician we in disagreement over her  stay, and another physician from [facility] provided a second opinion in support of the physician. The patient was not allowed to schedule a follow up visit with Dr. Russella Dar.  She has been eating a primarily liquid diet for months now, mostly Ensure-type shakes, and occasional eggs, pancakes or crackers.  Her mother comes to her house daily to take care of her.   "I feel like I'm burning up from the inside." "I still sweat, in the cold." "I get real clammy and feel like I'm going to pass out."  Poor sleep.  "I believe the anxiety is situational, and sleep related."  She reports that her pain is 10+/10.  She requires daily assistance from her mother. She has generalized weakness and often has difficulty walking. She does not have a neurologist at present.  She was previously followed by Dr. Anne Hahn, for her initial symptoms now attributed to Lyme Disease.      Review of Systems As above.    Objective:   Physical Exam  Vitals reviewed. Constitutional: She is oriented to person, place, and time. Vital signs are normal. She appears well-developed and well-nourished. She is active and cooperative.  Non-toxic appearance. She has a sickly appearance. No distress.  BP 120/82  Pulse 95  Temp(Src) 98.5 F (36.9 C) (Oral)  Resp 16  Ht 5' 5.5" (1.664 m)  Wt 189 lb 12.8 oz (86.093 kg)  BMI 31.09 kg/m2  SpO2 99%  She is dressed in very casual clothing, perhaps pajamas.  Her hair is unbrushed and she wears no makeup. She moves about the exam room, from the table to the stool, often putting her head down on the desk, sometimes standing and leaning against the counter.  She ambulates slowly, but does not appear unsteady.  She went to the bathroom unassisted/unaccompanied.  HENT:  Head: Normocephalic and atraumatic.  Right Ear: Hearing normal.  Left Ear: Hearing  normal.  Eyes: Conjunctivae are normal. No scleral icterus.  Neck: Normal range of motion. Neck supple. No thyromegaly present.  Cardiovascular: Normal rate, regular rhythm and normal heart sounds.   Pulses:      Radial pulses are 2+ on the right side, and 2+ on the left side.  Pulmonary/Chest: Effort normal and breath sounds normal.  Abdominal: Normal appearance and bowel sounds are normal. She exhibits no mass. There is no hepatosplenomegaly. There is generalized tenderness. There is no rigidity, no rebound, no guarding and no CVA tenderness. No hernia.  Lymphadenopathy:       Head (right side): No tonsillar, no preauricular, no posterior auricular and no occipital adenopathy present.       Head (left side): No tonsillar, no preauricular, no posterior auricular and no occipital adenopathy present.    She has no cervical adenopathy.       Right: No supraclavicular adenopathy present.       Left: No supraclavicular adenopathy present.  Neurological: She is alert and oriented to person, place, and time. No sensory deficit.  Skin: Skin is warm, dry and intact. No rash noted. No cyanosis or erythema. Nails show no clubbing.  Psychiatric: She has a normal mood and affect. Her behavior is normal.  She becomes tearful intermittently when describing how difficult it is to get her problems addressed, especially when so many people don't believe that Lyme Disease is the cause of them.   Labs drawn at recent ED visits are reviewed, and no additional labs performed.       Assessment & Plan:  1.  Extrapyramidal reaction Remain off the Reglan.  Unclear why she still needs the cogentin, as the effect of the Reglan should have completely resolved.  However, she feels better on it.  She can discuss continuation with neurology.   - benztropine (COGENTIN) 1 MG tablet; Take 1 tablet (1 mg total) by mouth 2 (two) times daily as needed for tremors.  Dispense: 14 tablet; Refill: 0  2. Abdominal pain 3.  Diarrhea 4. Irritable bowel syndrome She'll contact Dr. Merri BrunetteFina regarding the discontinuation of Reglan and for advice regarding her diet plan and symptoms between now and her scheduled visit with him 4/29.  5. Anemia, iron deficiency She needs to reschedule with hematology.  6. INTERSTITIAL CYSTITIS Continue follow-up with Dr. Logan BoresEvans.  She should contact his office regarding the upcoming diagnostic procedure, as they may need to reschedule it due to her current diarrhea.  7. Convulsive disorder She reports that she's been told that she is not having seizures, but there is clearly a convulsive disorder of some kind. - Ambulatory referral to Neurology  8. Lyme disease Continue follow-up with Dr. Abner GreenspanJemsek per his recommendations.  I suggested a referral to [facility] (she's seen an integrative specialist in [place] previously, and found that helpful), but she wants to think about it.  She's encouraged to schedule with her therapist and psychiatrist, given the significant negative impact of her quality of life.    Discussed with Dr. Merla Richesoolittle.   Fernande Brashelle S. Burnice Oestreicher, PA-C Physician Assistant-Certified [facility]

## 2014-04-19 NOTE — Patient Instructions (Addendum)
Stay well hydrated and get as much rest as you can. Follow Dr. Hulan FessFina's eating recommendations. Call him and tell him we've stopped the Reglan due to adverse effects, and ask for recommendations regarding the Welchol and eating plan. Let me know if you want to switch to a local GI specialist after you see him on 4/29. I can refer you to Dr. Charna ElizabethJyothi Mann. I have referred you to: Neurology [facility]), and back to Dr. Myna HidalgoEnnever regarding the iron deficiency. If you have not heard anything regarding the referrals in 1 week, please contact our office. When you get the results of the recent tests at Dr. Wyvonnia DuskyJemsek's, carry those to Dr. Gustavo LahEnnever's office. I recommend that you follow-up with Dr. Evelene CroonKaur and [facility] Fousek to discuss ways to manage the over-reaching effects of your illness on your life. You may benefit from seeing [facility] [phone]). Contact Dr. Logan BoresEvans office to ask if they can still do the bladder study on Thursday with your current diarrhea.

## 2014-04-20 NOTE — Addendum Note (Signed)
Addended by: Fernande BrasJEFFERY, Tykee Heideman S on: 04/20/2014 02:50 PM   Modules accepted: Orders

## 2014-05-17 ENCOUNTER — Telehealth: Payer: Self-pay

## 2014-05-17 NOTE — Telephone Encounter (Signed)
Dr. Gustavo LahEnnever's office called because they are unable to get in touch with pt to make an appt. I tried to call her twice. Left message on machine to call back on both numbers. Would you like me to send a letter?

## 2014-05-17 NOTE — Telephone Encounter (Signed)
Yes, please send her a letter

## 2014-05-18 NOTE — Telephone Encounter (Signed)
Letter sent.

## 2014-10-05 ENCOUNTER — Emergency Department (HOSPITAL_COMMUNITY): Payer: BC Managed Care – PPO

## 2014-10-05 ENCOUNTER — Emergency Department (HOSPITAL_COMMUNITY)
Admission: EM | Admit: 2014-10-05 | Discharge: 2014-10-06 | Disposition: A | Discharge status: Home / Self Care | Payer: BC Managed Care – PPO | Attending: Emergency Medicine | Admitting: Emergency Medicine

## 2014-10-05 ENCOUNTER — Encounter (HOSPITAL_COMMUNITY): Payer: Self-pay | Admitting: Emergency Medicine

## 2014-10-05 DIAGNOSIS — J45909 Unspecified asthma, uncomplicated: Secondary | ICD-10-CM | POA: Diagnosis not present

## 2014-10-05 DIAGNOSIS — K219 Gastro-esophageal reflux disease without esophagitis: Secondary | ICD-10-CM | POA: Insufficient documentation

## 2014-10-05 DIAGNOSIS — Z87442 Personal history of urinary calculi: Secondary | ICD-10-CM | POA: Insufficient documentation

## 2014-10-05 DIAGNOSIS — Z79899 Other long term (current) drug therapy: Secondary | ICD-10-CM | POA: Insufficient documentation

## 2014-10-05 DIAGNOSIS — Z862 Personal history of diseases of the blood and blood-forming organs and certain disorders involving the immune mechanism: Secondary | ICD-10-CM | POA: Diagnosis not present

## 2014-10-05 DIAGNOSIS — G43909 Migraine, unspecified, not intractable, without status migrainosus: Secondary | ICD-10-CM | POA: Insufficient documentation

## 2014-10-05 DIAGNOSIS — G40909 Epilepsy, unspecified, not intractable, without status epilepticus: Secondary | ICD-10-CM | POA: Diagnosis not present

## 2014-10-05 DIAGNOSIS — Z87891 Personal history of nicotine dependence: Secondary | ICD-10-CM | POA: Insufficient documentation

## 2014-10-05 DIAGNOSIS — M797 Fibromyalgia: Secondary | ICD-10-CM | POA: Diagnosis not present

## 2014-10-05 DIAGNOSIS — R569 Unspecified convulsions: Secondary | ICD-10-CM

## 2014-10-05 DIAGNOSIS — G8929 Other chronic pain: Secondary | ICD-10-CM | POA: Diagnosis not present

## 2014-10-05 LAB — COMPREHENSIVE METABOLIC PANEL
ALBUMIN: 3.7 g/dL (ref 3.5–5.2)
ALT: 20 U/L (ref 0–35)
AST: 22 U/L (ref 0–37)
Alkaline Phosphatase: 99 U/L (ref 39–117)
Anion gap: 17 — ABNORMAL HIGH (ref 5–15)
BUN: 8 mg/dL (ref 6–23)
CALCIUM: 9.5 mg/dL (ref 8.4–10.5)
CHLORIDE: 99 meq/L (ref 96–112)
CO2: 24 mEq/L (ref 19–32)
Creatinine, Ser: 0.63 mg/dL (ref 0.50–1.10)
GFR calc Af Amer: >90 mL/min (ref >90)
GFR calc non Af Amer: >90 mL/min (ref >90)
Glucose, Bld: 87 mg/dL (ref 70–99)
Potassium: 4 mEq/L (ref 3.7–5.3)
Sodium: 140 mEq/L (ref 137–147)
Total Bilirubin: <0.2 mg/dL — ABNORMAL LOW (ref 0.3–1.2)
Total Protein: 7.7 g/dL (ref 6.0–8.3)

## 2014-10-05 LAB — URINALYSIS, ROUTINE W REFLEX MICROSCOPIC
Bilirubin Urine: NEGATIVE
Glucose, UA: NEGATIVE mg/dL
Hgb urine dipstick: NEGATIVE
Ketones, ur: NEGATIVE mg/dL
NITRITE: NEGATIVE
Protein, ur: NEGATIVE mg/dL
SPECIFIC GRAVITY, URINE: 1.021 (ref 1.005–1.030)
Urobilinogen, UA: 0.2 mg/dL (ref 0.0–1.0)
pH: 7.5 (ref 5.0–8.0)

## 2014-10-05 LAB — CBC WITH DIFFERENTIAL/PLATELET
BASOS PCT: 0 % (ref 0–1)
Basophils Absolute: 0 10*3/uL (ref 0.0–0.1)
Eosinophils Absolute: 0.1 10*3/uL (ref 0.0–0.7)
Eosinophils Relative: 1 % (ref 0–5)
HEMATOCRIT: 39.8 % (ref 36.0–46.0)
Hemoglobin: 13.1 g/dL (ref 12.0–15.0)
Lymphocytes Relative: 49 % — ABNORMAL HIGH (ref 12–46)
Lymphs Abs: 4.4 10*3/uL — ABNORMAL HIGH (ref 0.7–4.0)
MCH: 29.8 pg (ref 26.0–34.0)
MCHC: 32.9 g/dL (ref 30.0–36.0)
MCV: 90.7 fL (ref 78.0–100.0)
MONO ABS: 0.4 10*3/uL (ref 0.1–1.0)
Monocytes Relative: 4 % (ref 3–12)
NEUTROS ABS: 4.2 10*3/uL (ref 1.7–7.7)
Neutrophils Relative %: 46 % (ref 43–77)
Platelets: 375 10*3/uL (ref 150–400)
RBC: 4.39 MIL/uL (ref 3.87–5.11)
RDW: 11.9 % (ref 11.5–15.5)
WBC: 9.1 10*3/uL (ref 4.0–10.5)

## 2014-10-05 LAB — URINE MICROSCOPIC-ADD ON

## 2014-10-05 LAB — CK: CK TOTAL: 428 U/L — AB (ref 7–177)

## 2014-10-05 LAB — ETHANOL: Alcohol, Ethyl (B): <11 mg/dL (ref 0–11)

## 2014-10-05 MED ORDER — LORAZEPAM 2 MG/ML IJ SOLN
1.0000 mg | Freq: Once | INTRAMUSCULAR | Status: AC
  Administered 2014-10-05: 1 mg via INTRAVENOUS
  Filled 2014-10-05: qty 1

## 2014-10-05 NOTE — ED Notes (Signed)
Patient transported to CT 

## 2014-10-05 NOTE — ED Provider Notes (Signed)
CSN: [id number]     Arrival date & time 10/05/14  2139 History   First MD Initiated Contact with Patient 10/05/14 2141     Chief Complaint  Patient presents with  . Seizures     (Consider location/radiation/quality/duration/timing/severity/associated sxs/prior Treatment) Patient is a 33 y.o. female presenting with seizures. The history is provided by the patient and the EMS personnel.  Seizures Seizure activity on arrival: no   Seizure type:  Unable to specify Preceding symptoms comment:  Pt was in her apt when the neighbors below her heard her upstairs and they called ems who found her post ictal,. Was given 5 mg of Versed. Pt staets "she seized for an hour."  No b/b incontinence, no tongue bitting. States has hx of seizures , typical. Initial focality:  Unable to specify Episode characteristics: abnormal movements, confusion and disorientation   Episode characteristics: no focal shaking, no generalized shaking, no incontinence and no tongue biting   Postictal symptoms: somnolence   Return to baseline: yes   Severity:  Mild Duration:  1 hour Timing:  Once Number of seizures this episode:  1 Progression:  Resolved Context: not alcohol withdrawal, not cerebral palsy, not change in medication (states taking all anti epileptics, no new meds), not developmental delay, not drug use, not fever, not hydrocephalus, not intracranial lesion and not intracranial shunt   Recent head injury:  During the event PTA treatment:  Midazolam History of seizures: yes     Past Medical History  Diagnosis Date  . Lyme disease   . Interstitial cystitis   . Bile salt-induced diarrhea   . GERD (gastroesophageal reflux disease)   . Bile reflux esophagitis   . Fibromyalgia   . Anemia, iron deficiency 02/05/2013  . Seizure   . IBS (irritable bowel syndrome)   . Gastroparesis   . Chronic abdominal pain   . Migraine headache   . Asthma   . GERD (gastroesophageal reflux disease)   . Nausea vomiting and  diarrhea     recurrent  . Kidney stones    Past Surgical History  Procedure Laterality Date  . Cholecystectomy    . Port-a-cath removal    . Sinusotomy    . Exploratory laparotomy     Family History  Problem Relation Age of Onset  . Hypertension Father   . Mental illness Father     depression   History  Substance Use Topics  . Smoking status: Former Smoker    Quit date: 01/16/2003  . Smokeless tobacco: Never Used  . Alcohol Use: No   OB History   Grav Para Term Preterm Abortions TAB SAB Ect Mult Living                 Review of Systems  Constitutional: Negative for fever, activity change, appetite change and fatigue.  HENT: Negative for congestion and rhinorrhea.   Eyes: Negative for discharge, redness and itching.  Respiratory: Negative for shortness of breath and wheezing.   Cardiovascular: Negative for chest pain.  Gastrointestinal: Negative for nausea, vomiting, abdominal pain and diarrhea.  Genitourinary: Negative for dysuria and hematuria.  Musculoskeletal: Negative for back pain.  Skin: Negative for rash and wound.  Neurological: Positive for seizures and headaches (arfter event). Negative for syncope.      Allergies  Reglan; Doxycycline; Ibuprofen; Lactose intolerance (gi); and Septra  Home Medications   Prior to Admission medications   Medication Sig Start Date End Date Taking? Authorizing Provider  benztropine (COGENTIN) 1 MG tablet Take 1  tablet (1 mg total) by mouth 2 (two) times daily as needed for tremors. 04/19/14 04/22/14  Chelle S Jeffery, PA-C  buPROPion (WELLBUTRIN XL) 300 MG 24 hr tablet Take 300 mg by mouth every morning.    Historical Provider, MD  cimetidine (TAGAMET) 200 MG tablet Take 200 mg by mouth at bedtime.    Historical Provider, MD  clonazePAM (KLONOPIN) 1 MG tablet Take 1 mg by mouth 4 (four) times daily as needed for anxiety.     Historical Provider, MD  colesevelam (WELCHOL) 625 MG tablet Take 625 mg by mouth 2 (two) times daily.      Historical Provider, MD  diazepam (VALIUM) 5 MG tablet Take 5-15 mg by mouth 3 (three) times daily as needed for anxiety (seizure).     Historical Provider, MD  diphenoxylate-atropine (LOMOTIL) 2.5-0.025 MG per tablet Take 2 tablets by mouth 4 (four) times daily as needed (IBS).     Historical Provider, MD  dronabinol (MARINOL) 10 MG capsule Take 10 mg by mouth 3 (three) times daily as needed.    Historical Provider, MD  esomeprazole (NEXIUM) 20 MG capsule Take 20 mg by mouth every morning.     Historical Provider, MD  HYDROcodone-acetaminophen (NORCO) 10-325 MG per tablet Take 1 tablet by mouth every 6 (six) hours as needed for moderate pain or severe pain.     Historical Provider, MD  hydrocortisone (CORTEF) 5 MG tablet Take 5 mg by mouth daily.    Historical Provider, MD  hydrOXYzine (ATARAX/VISTARIL) 25 MG tablet Take 25-50 mg by mouth every evening.     Historical Provider, MD  L-Methylfolate (DEPLIN) 15 MG TABS Take 15 mg by mouth every evening.     Historical Provider, MD  lamoTRIgine (LAMICTAL) 100 MG tablet Take 200 mg by mouth 2 (two) times daily.    Historical Provider, MD  levonorgestrel-ethinyl estradiol (SEASONALE) 0.15-0.03 MG tablet Take 1 tablet by mouth every evening.     Historical Provider, MD  loratadine (CLARITIN) 10 MG tablet Take 10 mg by mouth daily as needed for allergies.    Historical Provider, MD  LORazepam (ATIVAN) 1 MG tablet Take 1 tablet (1 mg total) by mouth 3 (three) times daily as needed for anxiety. 04/17/14   Rolland Porter, MD  metoCLOPramide (REGLAN) 10 MG tablet Take 10 mg by mouth 2 (two) times daily.    Historical Provider, MD  ondansetron (ZOFRAN ODT) 4 MG disintegrating tablet Take 1 tablet (4 mg total) by mouth every 8 (eight) hours as needed for nausea or vomiting. 10/06/14   Pilar Jarvis, MD  OXcarbazepine (TRILEPTAL) 150 MG tablet Take 300 mg by mouth 2 (two) times daily.     Historical Provider, MD  pentosan polysulfate (ELMIRON) 100 MG capsule Take 200  mg by mouth 2 (two) times daily.    Historical Provider, MD  pregabalin (LYRICA) 25 MG capsule Take 25 mg by mouth every evening.    Historical Provider, MD  Probiotic Product (PROBIOTIC DAILY PO) Take 1 capsule by mouth as needed (when any meals are consumed).     Historical Provider, MD  promethazine (PHENERGAN) 12.5 MG tablet Take 1 tablet (12.5 mg total) by mouth every 6 (six) hours as needed for nausea or vomiting. 04/17/14   Rolland Porter, MD  promethazine (PHENERGAN) 25 MG suppository Place 1 suppository (25 mg total) rectally every 6 (six) hours as needed for nausea or vomiting. 04/12/14   Junius Finner, PA-C  promethazine (PHENERGAN) 25 MG tablet Take 25 mg by mouth  3 (three) times daily as needed for nausea or vomiting.  10/31/12   Vida RollerBrian D Miller, MD  QUEtiapine (SEROQUEL) 25 MG tablet Take 25 mg by mouth at bedtime as needed (sleep).    Historical Provider, MD  scopolamine (TRANSDERM-SCOP) 1 MG/3DAYS Place 1 patch (1.5 mg total) onto the skin every 3 (three) days. 04/17/14   Rolland PorterMark James, MD  venlafaxine XR (EFFEXOR-XR) 75 MG 24 hr capsule Take 225 mg by mouth every morning.     Historical Provider, MD   BP 122/76  Pulse 105  Temp(Src) 98.2 F (36.8 C) (Oral)  Resp 13  Ht 5\' 8"  (1.727 m)  Wt 200 lb (90.719 kg)  BMI 30.42 kg/m2  SpO2 97%  LMP 07/05/2014 Physical Exam  Constitutional: She is oriented to person, place, and time. She appears well-developed and well-nourished. No distress.  HENT:  Head: Normocephalic and atraumatic.  Mouth/Throat: Oropharynx is clear and moist.  Eyes: Conjunctivae and EOM are normal. Pupils are equal, round, and reactive to light. Right eye exhibits no discharge. Left eye exhibits no discharge. No scleral icterus.  Neck: Normal range of motion. Neck supple.  Cardiovascular: Normal rate, regular rhythm and intact distal pulses.  Exam reveals no gallop and no friction rub.   No murmur heard. Pulmonary/Chest: Effort normal and breath sounds normal. No  respiratory distress. She has no wheezes. She has no rales.  Abdominal: Soft. She exhibits no distension and no mass. There is no tenderness.  Musculoskeletal: Normal range of motion.  Neurological: She is alert and oriented to person, place, and time. No cranial nerve deficit. She exhibits normal muscle tone. Coordination normal.  5/5 strength in R exts, 4/5 in L side, which she states is chronic d/t her Lyme history. Normal sensation in all exts 2+ DTRs patella and brachioradilias CNs II-XII intact Not walked as post ictal, fall risk   Skin: She is not diaphoretic.    ED Course  Procedures (including critical care time) Labs Review Labs Reviewed  CBC WITH DIFFERENTIAL - Abnormal; Notable for the following:    Lymphocytes Relative 49 (*)    Lymphs Abs 4.4 (*)    All other components within normal limits  COMPREHENSIVE METABOLIC PANEL - Abnormal; Notable for the following:    Total Bilirubin <0.2 (*)    Anion gap 17 (*)    All other components within normal limits  CK - Abnormal; Notable for the following:    Total CK 428 (*)    All other components within normal limits  URINALYSIS, ROUTINE W REFLEX MICROSCOPIC - Abnormal; Notable for the following:    APPearance CLOUDY (*)    Leukocytes, UA SMALL (*)    All other components within normal limits  URINE MICROSCOPIC-ADD ON - Abnormal; Notable for the following:    Squamous Epithelial / LPF FEW (*)    All other components within normal limits  ETHANOL    Imaging Review Ct Head Wo Contrast  10/05/2014   CLINICAL DATA:  Initial encounter for seizure at home. The patient fell and hit the back for head on the floor with loss of consciousness. History of seizures.  EXAM: CT HEAD WITHOUT CONTRAST  TECHNIQUE: Contiguous axial images were obtained from the base of the skull through the vertex without intravenous contrast.  COMPARISON:  CT head without contrast 02/19/2007.  FINDINGS: No acute cortical infarct, hemorrhage, or mass lesion  is present. The ventricles are of normal size. No significant extra-axial fluid collection is evident. The paranasal sinuses and  mastoid air cells are clear. The osseous skull is intact.  IMPRESSION: Negative CT of the head.   Electronically Signed   By: Gennette Pac M.D.   On: 10/05/2014 22:53     EKG Interpretation None      MDM   MDM: Patient is a 33 year old white female with past medical history of Lyme disease treated as well as interstitial cystitis as well as fibro myalgia as well as seizures who comes in with chief complaint of seizure. Patient brought in by EMS after neighbors heard her upstairs seizing. On arrival patient appears to be mild somnolent consistent with post ictal state however she quickly awakened on patient interview. She has a large amount of anxiety and is tearful on exam. She has tachycardia when she is anxious but otherwise normal heart rate. Rest of vital signs are normal. On exam she has a neurological exam at baseline, however should I did not walker initially due to concerns for fall risk. While in emergency department I was called to bedside by nursing because patient was tremulous. Patient had diffuse low amplitude low-frequency tremors, and she stated during his tremor spells that she was "seizing". Of note she was able to speak and follow commands during these spells. We ordered a head CT as well as labs to be thorough and workup was largely unremarkable. After workup patient's symptoms continued. We discussed with Dr. 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 He stated he had nothing to offer and  patient should followup with her neurologist as needed. I do agree that patient's symptoms are likely nonepileptic. She was able to provide history and answer questions throughout her stay in the ED. She was able to ambulate with  assistance of the nurse. She normally has a cane that is used for assistance and normally has some mild weakness in left side which is consistent with her exam here. She did ask for antinausea medicine and we gave her Zofran 4 mg IV as well as prescription. She did complain of some chronic neck pain as well and we gave her a dose of fentanyl but informed her that we cannot get prescriptions. When discussing with patient our plan to discharge, she did have cessation of her tremors. Patient may have epileptic seizures and she is taking her medicines appropriately, however her symptoms tonight are very consistent with non-epileptiform seizures. With a negative workup, stable vitals, able to ambulate, it is extremely unlikely that she has an acute neurological emergency. We instructed her to call her neurologist in the morning. If anything changes she is to return to the emergency department. Patient was discharged.  Final diagnoses:  Seizure-like activity    New Prescriptions   ONDANSETRON (ZOFRAN ODT) 4 MG DISINTEGRATING TABLET    Take 1 tablet (4 mg total) by mouth every 8 (eight) hours as needed for nausea or vomiting.   Your Neurologist  Schedule an appointment as soon as possible for a visit   Discharged  Pilar Jarvis, MD 10/06/14 [phone]

## 2014-10-05 NOTE — ED Notes (Signed)
Per EMS pt comes in from home with seizure-like activity. Pt lives alone. Pt has h/o lyme disease and seizures. Pt's neighbor witnessed her hitting the floor. Pt hit the back of her and LOC noted. 24GI IV placed right hand. Pt given 5mg  midazolam and 4mg  zofran on scene. Unknown time of seizure. VSS: bp 140/701, p110,r18, 92-98& O2 rm air,CBG 104.

## 2014-10-05 NOTE — ED Notes (Signed)
Drs. Marcha SoldersBrtalik and Romeo AppleHarrison into see pt.

## 2014-10-06 MED ORDER — FENTANYL CITRATE 0.05 MG/ML IJ SOLN
50.0000 ug | Freq: Once | INTRAMUSCULAR | Status: AC
  Administered 2014-10-06: 50 ug via INTRAVENOUS
  Filled 2014-10-06: qty 2

## 2014-10-06 MED ORDER — ONDANSETRON 4 MG PO TBDP: 4.0000 mg | ORAL_TABLET | Freq: Three times a day (TID) | ORAL | Status: DC | PRN

## 2014-10-06 MED ORDER — ONDANSETRON HCL 4 MG/2ML IJ SOLN
4.0000 mg | Freq: Once | INTRAMUSCULAR | Status: AC
  Administered 2014-10-06: 4 mg via INTRAVENOUS
  Filled 2014-10-06: qty 2

## 2014-10-06 NOTE — ED Notes (Signed)
Pt calling mother for ride home

## 2014-10-06 NOTE — Discharge Instructions (Signed)
Nonepileptic Seizures °Nonepileptic seizures are seizures that are not caused by abnormal electrical signals in your brain. These seizures often seem like epileptic seizures, but they are not caused by epilepsy.  °There are two types of nonepileptic seizures: °· A physiologic nonepileptic seizure results from a disruption in your brain. °· A psychogenic seizure results from emotional stress. These seizures are sometimes called pseudoseizures. °CAUSES  °Causes of physiologic nonepileptic seizures include:  °· Sudden drop in blood pressure. °· Low blood sugar. °· Low levels of salt (sodium) in your blood. °· Low levels of calcium in your blood. °· Migraine. °· Heart rhythm problems. °· Sleep disorders. °· Drug and alcohol abuse. °Common causes of psychogenic nonepileptic seizures include: °· Stress. °· Emotional trauma. °· Sexual or physical abuse. °· Major life events, such as divorce or the death of a loved one. °· Mental health disorders, including panic attack and hyperactivity disorder. °SIGNS AND SYMPTOMS °A nonepileptic seizure can look like an epileptic seizure, including uncontrollable shaking (convulsions), or changes in attention, behavior, or the ability to remain awake and alert. However, there are some differences. Nonepileptic seizures usually: °· Do not cause physical injuries. °· Start slowly. °· Include crying or shrieking. °· Last longer than 2 minutes. °· Have a short recovery time without headache or exhaustion. °DIAGNOSIS  °Your health care provider can usually diagnose nonepileptic seizures after taking your medical history and giving you a physical exam. Your health care provider may want to talk to your friends or relatives who have seen you have a seizure.  °You may also need to have tests to look for causes of physiologic nonepileptic seizures. This may include an electroencephalogram (EEG), which is a test that measures electrical activity in your brain. If you have had an epileptic  seizure, the results of your EEG will be abnormal. If your health care provider thinks you have had a psychogenic nonepileptic seizure, you may need to see a mental health specialist for an evaluation. °TREATMENT  °Treatment depends on the type and cause of your seizures. °· For physiologic nonepileptic seizures, treatment is aimed at addressing the underlying condition that caused the seizures. These seizures usually stop when the underlying condition is properly treated. °· Nonepileptic seizures do not respond to the seizure medicines used to treat epilepsy. °· For psychogenic seizures, you may need to work with a mental health specialist. °HOME CARE INSTRUCTIONS °Home care will depend on the type of nonepileptic seizures you have.  °· Follow all your health care provider's instructions. °· Keep all your follow-up appointments. °SEEK MEDICAL CARE IF: °You continue to have seizures after treatment. °SEEK IMMEDIATE MEDICAL CARE IF: °· Your seizures change or become more frequent. °· You injure yourself during a seizure. °· You have one seizure after another. °· You have trouble recovering from a seizure. °· You have chest pain or trouble breathing. °MAKE SURE YOU: °· Understand these instructions. °· Will watch your condition. °· Will get help right away if you are not doing well or get worse. °Document Released: 02/01/2006 Document Revised: 05/03/2014 Document Reviewed: 10/13/2013 °[employer]® Patient Information ©2015 [employer]. This information is not intended to replace advice given to you by your health care provider. Make sure you discuss any questions you have with your health care provider. ° °

## 2014-10-06 NOTE — ED Notes (Signed)
Informed RN that pt is experiencing nausea and neck pain

## 2014-10-06 NOTE — ED Notes (Signed)
Drs[facility] March[facility] SoldersBrtalik [facility].  Pt emotional & tearful with complaint of neck pain and nausea, denies other sx, reluctant to ambulate.  Slow to change positions.  Ambulated with cautious gait, tentative step and use of L leg. Touches toes first.  Usually uses cane. States, "nobody understands". No emesis or gagging noted. No dyspnea or sz activity noted. VSS.

## 2014-10-06 NOTE — ED Provider Notes (Signed)
Medical screening examination/treatment/procedure(s) were conducted as a shared visit with resident physician and myself.  I personally evaluated the patient during the encounter.   I interviewed and examined the patient. Lungs are CTAB. Cardiac exam wnl. Abdomen soft.  She notes she has about 2 seizures per month. Has been seen her for seizures in the past. Sx today c/w seizures she has had in the past including the mild left sided weakness which is not new. Has chronic neck pain sometimes worsened by seizures. Denies fever or hitting her head. Denies SI. Pt tearful and anxious appearing on exam. 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 I interpreted/reviewed the labs and/or imaging which were non-contributory. Pt ambulatory w/ minimal assistance. Case discussed w/ on call neuro. Will rec close f/u w/ her neurologist.   Purvis SheffieldForrest Phillipe Clemon, MD 10/06/14 1155

## 2015-03-29 ENCOUNTER — Ambulatory Visit (INDEPENDENT_AMBULATORY_CARE_PROVIDER_SITE_OTHER): Payer: BLUE CROSS/BLUE SHIELD | Admitting: Family Medicine

## 2015-03-29 VITALS — BP 136/78 | HR 57 | Temp 98.7°F | Ht 67.0 in | Wt 210.2 lb

## 2015-03-29 DIAGNOSIS — Z79899 Other long term (current) drug therapy: Secondary | ICD-10-CM

## 2015-03-29 DIAGNOSIS — L97911 Non-pressure chronic ulcer of unspecified part of right lower leg limited to breakdown of skin: Secondary | ICD-10-CM

## 2015-03-29 DIAGNOSIS — M25561 Pain in right knee: Secondary | ICD-10-CM

## 2015-03-29 DIAGNOSIS — A6920 Lyme disease, unspecified: Secondary | ICD-10-CM | POA: Diagnosis not present

## 2015-03-29 DIAGNOSIS — L03115 Cellulitis of right lower limb: Secondary | ICD-10-CM | POA: Diagnosis not present

## 2015-03-29 LAB — POCT CBC
Granulocyte percent: 63.9 %G (ref 37–80)
HCT, POC: 36.5 % — AB (ref 37.7–47.9)
Hemoglobin: 11.9 g/dL — AB (ref 12.2–16.2)
LYMPH, POC: 3 (ref 0.6–3.4)
MCH, POC: 29.3 pg (ref 27–31.2)
MCHC: 32.8 g/dL (ref 31.8–35.4)
MCV: 89.5 fL (ref 80–97)
MID (CBC): 0.7 (ref 0–0.9)
MPV: 7.1 fL (ref 0–99.8)
POC GRANULOCYTE: 6.5 (ref 2–6.9)
POC LYMPH PERCENT: 29.4 %L (ref 10–50)
POC MID %: 6.7 %M (ref 0–12)
Platelet Count, POC: 410 10*3/uL (ref 142–424)
RBC: 4.08 M/uL (ref 4.04–5.48)
RDW, POC: 13.1 %
WBC: 10.2 10*3/uL (ref 4.6–10.2)

## 2015-03-29 MED ORDER — CLINDAMYCIN HCL 300 MG PO CAPS: 300.0000 mg | ORAL_CAPSULE | Freq: Four times a day (QID) | ORAL | Status: DC

## 2015-03-29 NOTE — Progress Notes (Addendum)
Subjective:   This chart was scribed for Dana Staggers, MD by Jarvis Morgan, Medical Scribe. This patient was seen in Room 7 and the patient's care was started at 9:21 PM.   Patient ID: Dana Lozano, female    DOB: 1981-12-04, 34 y.o.   MRN: [id number]  Chief Complaint  Patient presents with  . Recurrent Skin Infections    Right Thigh-Lyme Lesions    HPI Dana Lozano is a 34 y.o. female   h/o Lyme disease, other medical problems per problem list. She presents with possible abscess. She was last seen here by Theora Gianotti, PA-C in April 2015. Her Lyme disease is managed by Dr. Abner Greenspan in [place]. Pt has a central port and is on IV antibiotic treatments Clindamycin and Zithromax 3x per week (Monday, Wednesday, Friday) that began November 2015. She is usually on for 2 weeks and off 2 weeks between. She states the second week is Cipro and Zithromycin IV treatments. 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 Pt states she is here for a Lyme lesion that has ulcerated and is scattered throughout her body. The worst areas are to her right thigh and below right knee and she states she has never had areas like this before and never had to seek treatment for it. Pt states she noticed it yesterday and it became red, ulcerated, warm and had discharge. She also notes some fatigue. Pt applied peroxide to the area and Bactine cream along with the triple antibiotic treatment with no relief. She states the pain has spread up to her groin right groin. Pt states that the skin areas are exacerbated when on IV treatment. She denies any fever or chills.   Patient Active Problem List   Diagnosis Date Noted  . Anemia, iron deficiency 02/05/2013  . NAUSEA AND VOMITING 09/25/2010  . DIARRHEA 09/25/2010  . LYME DISEASE 02/25/2008  . ANXIETY 02/25/2008  . DEPRESSION 02/25/2008  . ALLERGIC RHINITIS, SEASONAL 02/25/2008  . ASTHMA 02/25/2008  . GERD  02/25/2008  . ARTHRITIS 02/25/2008  . HEMORRHOIDS, EXTERNAL 08/13/2007  . ESOPHAGITIS 08/13/2007  . HYPERLIPIDEMIA 03/17/2007  . MIGRAINE HEADACHE 03/17/2007  . IRRITABLE BOWEL SYNDROME 03/17/2007  . INTERSTITIAL CYSTITIS 03/17/2007  . FIBROMYALGIA 03/17/2007  . NEPHROLITHIASIS, HX OF 03/17/2007   Past Medical History  Diagnosis Date  . Lyme disease   . Interstitial cystitis   . Bile salt-induced diarrhea   . GERD (gastroesophageal reflux disease)   . Bile reflux esophagitis   . Fibromyalgia   . Anemia, iron deficiency 02/05/2013  . Seizure   . IBS (irritable bowel syndrome)   . Gastroparesis   . Chronic abdominal pain   . Migraine headache   . Asthma   . GERD (gastroesophageal reflux disease)   . Nausea vomiting and diarrhea     recurrent  . Kidney stones    Past Surgical History  Procedure Laterality Date  . Cholecystectomy    . Port-a-cath removal    . Sinusotomy    . Exploratory laparotomy     Allergies  Allergen Reactions  . Reglan [Metoclopramide] Other (See Comments)    Nerve pain  . Doxycycline Nausea And Vomiting  . Ibuprofen Swelling  . Lactose Intolerance (Gi)     Stomach cramps  . Septra [Sulfamethoxazole-Trimethoprim] Other (See Comments)    Severe mouth ulcers, possibly caused seizures   Prior to Admission medications   Medication Sig Start Date End Date Taking? Authorizing  Provider  buPROPion (WELLBUTRIN XL) 300 MG 24 hr tablet Take 300 mg by mouth every morning.   Yes Historical Provider, MD  clonazePAM (KLONOPIN) 1 MG tablet Take 1 mg by mouth 4 (four) times daily as needed for anxiety.    Yes Historical Provider, MD  colesevelam (WELCHOL) 625 MG tablet Take 625 mg by mouth 2 (two) times daily.    Yes Historical Provider, MD  diazepam (VALIUM) 5 MG tablet Take 5-15 mg by mouth 3 (three) times daily as needed for anxiety (seizure).    Yes Historical Provider, MD  diphenoxylate-atropine (LOMOTIL) 2.5-0.025 MG per tablet Take 2 tablets by mouth 4  (four) times daily as needed (IBS).    Yes Historical Provider, MD  dronabinol (MARINOL) 10 MG capsule Take 10 mg by mouth 3 (three) times daily as needed.   Yes Historical Provider, MD  esomeprazole (NEXIUM) 20 MG capsule Take 20 mg by mouth every morning.    Yes Historical Provider, MD  HYDROcodone-acetaminophen (NORCO) 10-325 MG per tablet Take 1 tablet by mouth every 6 (six) hours as needed for moderate pain or severe pain.    Yes Historical Provider, MD  hydrocortisone (CORTEF) 5 MG tablet Take 5 mg by mouth daily.   Yes Historical Provider, MD  hydrOXYzine (ATARAX/VISTARIL) 25 MG tablet Take 25-50 mg by mouth every evening.    Yes Historical Provider, MD  L-Methylfolate (DEPLIN) 15 MG TABS Take 15 mg by mouth every evening.    Yes Historical Provider, MD  lamoTRIgine (LAMICTAL) 100 MG tablet Take 200 mg by mouth 2 (two) times daily.   Yes Historical Provider, MD  levonorgestrel-ethinyl estradiol (SEASONALE) 0.15-0.03 MG tablet Take 1 tablet by mouth every evening.    Yes Historical Provider, MD  loratadine (CLARITIN) 10 MG tablet Take 10 mg by mouth daily as needed for allergies.   Yes Historical Provider, MD  ondansetron (ZOFRAN ODT) 4 MG disintegrating tablet Take 1 tablet (4 mg total) by mouth every 8 (eight) hours as needed for nausea or vomiting. 10/06/14  Yes Pilar Jarvis, MD  OXcarbazepine (TRILEPTAL) 150 MG tablet Take 300 mg by mouth 2 (two) times daily.    Yes Historical Provider, MD  pentosan polysulfate (ELMIRON) 100 MG capsule Take 200 mg by mouth 2 (two) times daily.   Yes Historical Provider, MD  pregabalin (LYRICA) 25 MG capsule Take 25 mg by mouth every evening.   Yes Historical Provider, MD  Probiotic Product (PROBIOTIC DAILY PO) Take 1 capsule by mouth as needed (when any meals are consumed).    Yes Historical Provider, MD  promethazine (PHENERGAN) 25 MG suppository Place 1 suppository (25 mg total) rectally every 6 (six) hours as needed for nausea or vomiting. 04/12/14  Yes  Junius Finner, PA-C  promethazine (PHENERGAN) 25 MG tablet Take 25 mg by mouth 3 (three) times daily as needed for nausea or vomiting.  10/31/12  Yes Eber Hong, MD  QUEtiapine (SEROQUEL) 25 MG tablet Take 25 mg by mouth at bedtime as needed (sleep).   Yes Historical Provider, MD  venlafaxine XR (EFFEXOR-XR) 75 MG 24 hr capsule Take 225 mg by mouth every morning.    Yes Historical Provider, MD  cimetidine (TAGAMET) 200 MG tablet Take 200 mg by mouth at bedtime.    Historical Provider, MD  LORazepam (ATIVAN) 1 MG tablet Take 1 tablet (1 mg total) by mouth 3 (three) times daily as needed for anxiety. Patient not taking: Reported on 03/29/2015 04/17/14   Rolland Porter, MD  scopolamine (TRANSDERM-SCOP)  1 MG/3DAYS Place 1 patch (1.5 mg total) onto the skin every 3 (three) days. Patient not taking: Reported on 03/29/2015 04/17/14   Rolland Porter, MD   History   Social History  . Marital Status: Single    Spouse Name: n/a  . Number of Children: 0  . Years of Education: college   Occupational History  . unemployed     denied disability x 2   Social History Main Topics  . Smoking status: Former Smoker    Quit date: 01/16/2003  . Smokeless tobacco: Never Used  . Alcohol Use: No  . Drug Use: No  . Sexual Activity: Not Currently   Other Topics Concern  . 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 Lives alone. Her parents live in [place]. Her parents support her financially.    Review of Systems  Constitutional: Positive for fatigue. Negative for fever and chills.  Musculoskeletal: Positive for myalgias.  Skin: Positive for color change and wound.       Objective:   Physical Exam  Constitutional: She is oriented to person, place, and time. She appears well-developed and well-nourished. No distress.  HENT:  Head: Normocephalic and atraumatic.  Eyes: Conjunctivae and EOM are normal.  Neck: Neck supple. No tracheal deviation  present.  Cardiovascular: Normal rate.   Pulmonary/Chest: Effort normal. No respiratory distress.  Musculoskeletal: Normal range of motion.  Diffusely tender over medial anterior thigh extending to medial thigh and saphenous area with slight tenderness over right inguinal crease with no proximal red streaking from wound.   Lymphadenopathy:  No apparent enlarged inguinal lymph nodes  Neurological: She is alert and oriented to person, place, and time.  Skin: Skin is warm and dry.  Right lateral knee area 1 cm ulcerative apparent lesion with white yellow central discharge. There is minimal surrounding erythema of approx 2-3 mm around the wound. 1.5 cm central ulcer on right distal thigh anteriorally.10 cm x 6 cm of erythema surrounding central ulcer. White, yellow escar with slight yellow discharge over central ulcer. Diffuse induration of involved area without central fluctuance  Psychiatric: She has a normal mood and affect. Her behavior is normal.  Nursing note and vitals reviewed.    Filed Vitals:   03/29/15 2047  BP: 136/78  Pulse: 57  Temp: 98.7 F (37.1 C)  TempSrc: Oral  Height: 5\' 7"  (1.702 m)  Weight: 210 lb 4 oz (95.369 kg)  SpO2: 97%   Results for orders placed or performed in visit on 03/29/15  POCT CBC  Result Value Ref Range   WBC 10.2 4.6 - 10.2 K/uL   Lymph, poc 3.0 0.6 - 3.4   POC LYMPH PERCENT 29.4 10 - 50 %L   MID (cbc) 0.7 0 - 0.9   POC MID % 6.7 0 - 12 %M   POC Granulocyte 6.5 2 - 6.9   Granulocyte percent 63.9 37 - 80 %G   RBC 4.08 4.04 - 5.48 M/uL   Hemoglobin 11.9 (A) 12.2 - 16.2 g/dL   HCT, POC 16.1 (A) 09.6 - 47.9 %   MCV 89.5 80 - 97 fL   MCH, POC 29.3 27 - 31.2 pg   MCHC 32.8 31.8 - 35.4 g/dL   RDW, POC 04.5 %   Platelet Count, POC 410 142 - 424 K/uL   MPV 7.1 0 - 99.8 fL      Assessment & Plan:   Hurshel Keys  Marianna FussMcSwain is a 34 y.o. female Cellulitis of right lower extremity - Plan: POCT CBC, Wound culture, clindamycin (CLEOCIN) 300 MG capsule,  DISCONTINUED: clindamycin (CLEOCIN) 300 MG capsule  Pain in joint, lower leg, right - Plan: POCT CBC  Lyme disease  High risk medication use  Leg ulcer, right, limited to breakdown of skin - Plan: clindamycin (CLEOCIN) 300 MG capsule, DISCONTINUED: clindamycin (CLEOCIN) 300 MG capsule  History of complicated PMH with L[place] disease [place],  [place] IV antibiotics MWF on first week (clindamycin and azithromycin) followed by 5 days of Cipro and azithromycin IV Monday through Friday on 2nd week, followed by 2 week break. Recurrent skin ulcerations, but usually heal without difficulty. Now with suspected secondary cellulitis of dorsal lower R thigh wound/ulcer.  Does have ttp over saphenous area, but no erythema in this area, and no apparent inguinal lymphadenopathy. Patient discussed with infectious disease provider on call.   -will treat cellulitis for possible MRSA while awaiting wound culture. Started on Clindamycin as allergic to Doxycycline and Septra.   -advised to call provider in [place] as may not need to be on other IV abx's while taking clindamycin po.  Risks of Clostridium difficile colitis and symptoms/rtc precautions discussed.   -area of induration and erythema outlined with marker. telfa and bandage applied.   -warm compresses  -recheck tomorrow to determine if outpatient/oral medication will be tolerated.  -overnight ER precautions discussed. Understanding expressed.    *reordered clindamycin to pharmacy open at this time.   Meds ordered this encounter                   . clindamycin (CLEOCIN) 300 MG capsule    Sig: Take 1 capsule (300 mg total) by mouth 4 (four) times daily.    Dispense:  40 capsule    Refill:  0   Patient Instructions  Start Clindamycin for skin infection (cellulitis) tonight. This was chosen as you are allergic to both doxycycline and septra. Call your Lyme disease doctor in [place] tomorrow to discuss what to do about  your usual IV antibiotics as may not need to take iv antibiotic if taking same oral antibiotic. There is a risk of diarrhea or secondary  Intestinal infection with a bacteria called C. difficile with prolonged antibiotics or multiple antibiotics. If you do experience new diarrhea, be evaluated for this. I would also discuss whether you should still take your other IV antibiotics while taking the clindamycin by mouth.   Apply warm compresses at least 4 times per day to this area, and recheck tomorrow as discussed. If area appears to be worsening at that time, you may need to be evaluated through the emergency room as you have been on other IV antibiotics.    If acute worsening overnight, including fevers - go to the emergency room.   Cellulitis Cellulitis is an infection of the skin and the tissue beneath it. The infected area is usually red and tender. Cellulitis occurs most often in the arms and lower legs.  CAUSES  Cellulitis is caused by bacteria that enter the skin through cracks or cuts in the skin. The most common types of bacteria that cause cellulitis are staphylococci and streptococci. SIGNS AND SYMPTOMS   Redness and warmth.  Swelling.  Tenderness or pain.  Fever. DIAGNOSIS  Your health care provider can usually determine what is wrong based on a physical exam. Blood tests may also be done. TREATMENT  Treatment usually involves taking  an antibiotic medicine. HOME CARE INSTRUCTIONS   Take your antibiotic medicine as directed by your health care provider. Finish the antibiotic even if you start to feel better.  Keep the infected arm or leg elevated to reduce swelling.  Apply a warm cloth to the affected area up to 4 times per day to relieve pain.  Take medicines only as directed by your health care provider.  Keep all follow-up visits as directed by your health care provider. SEEK MEDICAL CARE IF:   You notice red streaks coming from the infected area.  Your red area  gets larger or turns dark in color.  Your bone or joint underneath the infected area becomes painful after the skin has healed.  Your infection returns in the same area or another area.  You notice a swollen bump in the infected area.  You develop new symptoms.  You have a fever. SEEK IMMEDIATE MEDICAL CARE IF:   You feel very sleepy.  You develop vomiting or diarrhea.  You have a general ill feeling (malaise) with muscle aches and pains. MAKE SURE YOU:   Understand these instructions.  Will watch your condition.  Will get help right away if you are not doing well or get worse. Document Released: 09/26/2005 Document Revised: 05/03/2014 Document Reviewed: 03/03/2012 [facility] Patient Information 2015 [place], [place]. This information is not intended to replace advice given to you by your health care provider. Make sure you discuss any questions you have with your health care provider.        I personally performed the services described in this documentation, which was scribed in my presence. The recorded information has been reviewed and considered, and addended by me as needed.

## 2015-03-29 NOTE — Patient Instructions (Addendum)
Start Clindamycin for skin infection (cellulitis) tonight. This was chosen as you are allergic to both doxycycline and septra. Call your Lyme disease doctor in [place] tomorrow to discuss what to do about your usual IV antibiotics as may not need to take iv antibiotic if taking same oral antibiotic. There is a risk of diarrhea or secondary  Intestinal infection with a bacteria called C. difficile with prolonged antibiotics or multiple antibiotics. If you do experience new diarrhea, be evaluated for this. I would also discuss whether you should still take your other IV antibiotics while taking the clindamycin by mouth.   Apply warm compresses at least 4 times per day to this area, and recheck tomorrow as discussed. If area appears to be worsening at that time, you may need to be evaluated through the emergency room as you have been on other IV antibiotics.    If acute worsening overnight, including fevers - go to the emergency room.   Cellulitis Cellulitis is an infection of the skin and the tissue beneath it. The infected area is usually red and tender. Cellulitis occurs most often in the arms and lower legs.  CAUSES  Cellulitis is caused by bacteria that enter the skin through cracks or cuts in the skin. The most common types of bacteria that cause cellulitis are staphylococci and streptococci. SIGNS AND SYMPTOMS   Redness and warmth.  Swelling.  Tenderness or pain.  Fever. DIAGNOSIS  Your health care provider can usually determine what is wrong based on a physical exam. Blood tests may also be done. TREATMENT  Treatment usually involves taking an antibiotic medicine. HOME CARE INSTRUCTIONS   Take your antibiotic medicine as directed by your health care provider. Finish the antibiotic even if you start to feel better.  Keep the infected arm or leg elevated to reduce swelling.  Apply a warm cloth to the affected area up to 4 times per day to relieve pain.  Take medicines only as directed by  your health care provider.  Keep all follow-up visits as directed by your health care provider. SEEK MEDICAL CARE IF:   You notice red streaks coming from the infected area.  Your red area gets larger or turns dark in color.  Your bone or joint underneath the infected area becomes painful after the skin has healed.  Your infection returns in the same area or another area.  You notice a swollen bump in the infected area.  You develop new symptoms.  You have a fever. SEEK IMMEDIATE MEDICAL CARE IF:   You feel very sleepy.  You develop vomiting or diarrhea.  You have a general ill feeling (malaise) with muscle aches and pains. MAKE SURE YOU:   Understand these instructions.  Will watch your condition.  Will get help right away if you are not doing well or get worse. Document Released: 09/26/2005 Document Revised: 05/03/2014 Document Reviewed: 03/03/2012 [facility] Patient Information 2015 [place], [place]. This information is not intended to replace advice given to you by your health care provider. Make sure you discuss any questions you have with your health care provider.

## 2015-03-30 ENCOUNTER — Ambulatory Visit (INDEPENDENT_AMBULATORY_CARE_PROVIDER_SITE_OTHER): Payer: BLUE CROSS/BLUE SHIELD | Admitting: Family Medicine

## 2015-03-30 VITALS — BP 130/80 | HR 105 | Temp 98.3°F | Resp 18 | Ht 67.0 in | Wt 208.0 lb

## 2015-03-30 DIAGNOSIS — L03115 Cellulitis of right lower limb: Secondary | ICD-10-CM | POA: Diagnosis not present

## 2015-03-30 NOTE — Progress Notes (Addendum)
Subjective:  This chart was scribed for [name], MD by Elveria Rising, Medial Scribe. This patient was seen in room 13 and the patient's care was started at 3:37 PM.    Patient ID: Dana Lozano, female    DOB: 02-15-1981, 34 y.o.   MRN: [id number] Chief Complaint  Patient presents with  . Dressing Change    wound care / using IV clindamycin (allergies to other antibiotics)    HPI HPI Comments: Dana Lozano is a 34 y.o. female with an extensive PMHx including Lyme Disease who presents to the [facility] re evaluation of her cellulitis.  Patient with history of Lyme disease with episodic ulcers on legs followed by a lyme disease specialist in [place] who has been treating her with IV antibiotics through a port for two week intervals followed by two weeks off. See note from last night regarding details. Patient was seen last night for an area of ulceration to her anterior lower right thigh that had become red and swollen with increased discharge from wound in two days time. Patient also admitted to fatigue with this infection. Patient had received IV dose of Clindamycin and Azithromycin two days ago but had been off antibiotics for three weeks prior. Patient diagnosed with cellulitis. Discussed with Infectious Disease provider last night who recommended treatment with Doxycycline PO.  Due to her allergy to Doxy and Septra patient started on Clindamycin 6mg  qid for 10 days. Patient also instructed to call her specialist in [place] about holding IV antibiotics while on oral medications. Patient is here for follow up today. Wound culture was obtained last night; no results yet. CBC normal yesrerday except borderline hemoglobin of 11.9 and WBC of 10.2. Patient was afebrile yesterday.   Today patient reports that she is not in "as much pain." She however reports increased drainage.  Patient reports treatment with warm compress once last night for about two hours and none  this morning. Patient did not take the oral Clindamycin she was prescribed; she instead administered 900mg  IV Clindamycin anticipating that that would be the treatment recommended by her doctor in [place]. Patient reports talking with a nurse at Dr. Wyvonnia Dusky office this morning and she was informed to administer the IV antibiotics this morning and to wait 12 hours until the next dose. Patient was further instructed and to continue throughout the week as normally scheduled. Patient's regimen for the week: MWF 900mg  of Clindamycin IV in the morning and night and Azithromycin 500mg  once at night.  Patient reports historical GI issues with taking PO antibiotics; patient reports history of gastroparesis, and chronic nausea and vomiting. Patient states that she does not feel that she would be able to tolerate the oral antibiotics given her history.     Patient Active Problem List   Diagnosis Date Noted  . Anemia, iron deficiency 02/05/2013  . NAUSEA AND VOMITING 09/25/2010  . DIARRHEA 09/25/2010  . LYME DISEASE 02/25/2008  . ANXIETY 02/25/2008  . DEPRESSION 02/25/2008  . ALLERGIC RHINITIS, SEASONAL 02/25/2008  . ASTHMA 02/25/2008  . GERD 02/25/2008  . ARTHRITIS 02/25/2008  . HEMORRHOIDS, EXTERNAL 08/13/2007  . ESOPHAGITIS 08/13/2007  . HYPERLIPIDEMIA 03/17/2007  . MIGRAINE HEADACHE 03/17/2007  . IRRITABLE BOWEL SYNDROME 03/17/2007  . INTERSTITIAL CYSTITIS 03/17/2007  . FIBROMYALGIA 03/17/2007  . NEPHROLITHIASIS, HX OF 03/17/2007   Past Medical History  Diagnosis Date  . Lyme disease   . Interstitial cystitis   . Bile salt-induced diarrhea   . GERD (  gastroesophageal reflux disease)   . Bile reflux esophagitis   . Fibromyalgia   . Anemia, iron deficiency 02/05/2013  . Seizure   . IBS (irritable bowel syndrome)   . Gastroparesis   . Chronic abdominal pain   . Migraine headache   . Asthma   . GERD (gastroesophageal reflux disease)   . Nausea vomiting and diarrhea     recurrent  . Kidney  stones    Past Surgical History  Procedure Laterality Date  . Cholecystectomy    . Port-a-cath removal    . Sinusotomy    . Exploratory laparotomy     Allergies  Allergen Reactions  . Reglan [Metoclopramide] Other (See Comments)    Nerve pain  . Doxycycline Nausea And Vomiting  . Ibuprofen Swelling  . Lactose Intolerance (Gi)     Stomach cramps  . Septra [Sulfamethoxazole-Trimethoprim] Other (See Comments)    Severe mouth ulcers, possibly caused seizures   Prior to Admission medications   Medication Sig Start Date End Date Taking? Authorizing Provider  buPROPion (WELLBUTRIN XL) 300 MG 24 hr tablet Take 300 mg by mouth every morning.    Historical Provider, MD  cimetidine (TAGAMET) 200 MG tablet Take 200 mg by mouth at bedtime.    Historical Provider, MD  clindamycin (CLEOCIN) 300 MG capsule Take 1 capsule (300 mg total) by mouth 4 (four) times daily. 03/29/15   Shade Flood, MD  clonazePAM (KLONOPIN) 1 MG tablet Take 1 mg by mouth 4 (four) times daily as needed for anxiety.     Historical Provider, MD  colesevelam (WELCHOL) 625 MG tablet Take 625 mg by mouth 2 (two) times daily.     Historical Provider, MD  diazepam (VALIUM) 5 MG tablet Take 5-15 mg by mouth 3 (three) times daily as needed for anxiety (seizure).     Historical Provider, MD  diphenoxylate-atropine (LOMOTIL) 2.5-0.025 MG per tablet Take 2 tablets by mouth 4 (four) times daily as needed (IBS).     Historical Provider, MD  dronabinol (MARINOL) 10 MG capsule Take 10 mg by mouth 3 (three) times daily as needed.    Historical Provider, MD  esomeprazole (NEXIUM) 20 MG capsule Take 20 mg by mouth every morning.     Historical Provider, MD  HYDROcodone-acetaminophen (NORCO) 10-325 MG per tablet Take 1 tablet by mouth every 6 (six) hours as needed for moderate pain or severe pain.     Historical Provider, MD  hydrocortisone (CORTEF) 5 MG tablet Take 5 mg by mouth daily.    Historical Provider, MD  hydrOXYzine  (ATARAX/VISTARIL) 25 MG tablet Take 25-50 mg by mouth every evening.     Historical Provider, MD  L-Methylfolate (DEPLIN) 15 MG TABS Take 15 mg by mouth every evening.     Historical Provider, MD  lamoTRIgine (LAMICTAL) 100 MG tablet Take 200 mg by mouth 2 (two) times daily.    Historical Provider, MD  levonorgestrel-ethinyl estradiol (SEASONALE) 0.15-0.03 MG tablet Take 1 tablet by mouth every evening.     Historical Provider, MD  loratadine (CLARITIN) 10 MG tablet Take 10 mg by mouth daily as needed for allergies.    Historical Provider, MD  LORazepam (ATIVAN) 1 MG tablet Take 1 tablet (1 mg total) by mouth 3 (three) times daily as needed for anxiety. Patient not taking: Reported on 03/29/2015 04/17/14   Rolland Porter, MD  ondansetron (ZOFRAN ODT) 4 MG disintegrating tablet Take 1 tablet (4 mg total) by mouth every 8 (eight) hours as needed for nausea or  vomiting. 10/06/14   Pilar Jarvis, MD  OXcarbazepine (TRILEPTAL) 150 MG tablet Take 300 mg by mouth 2 (two) times daily.     Historical Provider, MD  pentosan polysulfate (ELMIRON) 100 MG capsule Take 200 mg by mouth 2 (two) times daily.    Historical Provider, MD  pregabalin (LYRICA) 25 MG capsule Take 25 mg by mouth every evening.    Historical Provider, MD  Probiotic Product (PROBIOTIC DAILY PO) Take 1 capsule by mouth as needed (when any meals are consumed).     Historical Provider, MD  promethazine (PHENERGAN) 25 MG suppository Place 1 suppository (25 mg total) rectally every 6 (six) hours as needed for nausea or vomiting. 04/12/14   Junius Finner, PA-C  promethazine (PHENERGAN) 25 MG tablet Take 25 mg by mouth 3 (three) times daily as needed for nausea or vomiting.  10/31/12   Eber Hong, MD  QUEtiapine (SEROQUEL) 25 MG tablet Take 25 mg by mouth at bedtime as needed (sleep).    Historical Provider, MD  scopolamine (TRANSDERM-SCOP) 1 MG/3DAYS Place 1 patch (1.5 mg total) onto the skin every 3 (three) days. Patient not taking: Reported on 03/29/2015  04/17/14   Rolland Porter, MD  venlafaxine XR (EFFEXOR-XR) 75 MG 24 hr capsule Take 225 mg by mouth every morning.     Historical Provider, MD   History   Social History  . Marital Status: Single    Spouse Name: n/a  . Number of Children: 0  . Years of Education: college   Occupational History  . unemployed     denied disability x 2   Social History Main Topics  . Smoking status: Former Smoker    Quit date: 01/16/2003  . Smokeless tobacco: Never Used  . Alcohol Use: No  . Drug Use: No  . Sexual Activity: Not Currently   Other Topics Concern  . 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 Lives alone. Her parents live in [place]. Her parents support her financially.      Review of Systems  Constitutional: Negative for fever and chills.  Musculoskeletal: Positive for myalgias.  Skin: Positive for color change and wound.       Ulcer        Objective:   Physical Exam  Constitutional: She is oriented to person, place, and time. She appears well-developed and well-nourished. No distress.  HENT:  Head: Normocephalic and atraumatic.  Eyes: EOM are normal.  Neck: Neck supple. No tracheal deviation present.  Cardiovascular: Normal rate.   Pulmonary/Chest: Effort normal. No respiratory distress.  Musculoskeletal: Normal range of motion.  Neurological: She is alert and oriented to person, place, and time.  Skin: Skin is warm and dry.  Erythema surrounding the central ulcerative appearing lesion. Erythema is limited to outline from last night which measures at 8cm x 14cm. Central ulcer is 1.5cm. Slightly darker erythema around ulcer measures 3cm x 5cm. Slightly tenderness along saphenous  vein, no red line or pal nodules. No calf tenderness or swelling. No inguinal lymphadenopathy appreciated.   Psychiatric: She has a normal mood and affect. Her behavior is normal.  Nursing note and vitals reviewed.   Filed Vitals:    03/30/15 1529  BP: 130/80  Pulse: 105  Temp: 98.3 F (36.8 C)  Resp: 18  Height:  (1.702 m)  Weight: 208 lb (94.348 kg)  SpO2: 98%   Update - patient contacted her provider in [place].  He wanted her to continue clindamycin Q12 hours through Saturday. His office will be sending antibiotics overnight so she has sufficient quantity.  Advised her to clarify with this provider what to do beyond Saturday as this would only be 5 days of treatment and would recommend at least 7 to 10 days treatment for cellulitis.    Assessment & Plan:   Dana CoffinHolly M Pancoast is a 34 y.o. female Cellulitis of leg, right  - stable. New outline placed and dated. Afebrile.  S/p 2 doses of IV clindamycin at 900mg  Q12 hours. Unable to take po clindamycin by hx with gastroparesis. Po clindamycin at 450mg  Q6h option of mrsa skin infection, so 900mg  Q12 h should approximate this dosing. She will remain on IV clindamycin as prescribed by Lyme specialist as above. Has enough supply at home and more is being sent by this provider. Advised to check with her specialist on duration as would recommend at least 7-10 days. Follow up with Dr. Patsy Lageropland in 48 to 72 hours for recheck. RTC/ER precautions discussed sooner if needed.   No orders of the defined types were placed in this encounter.   Patient Instructions  Clarify with your provider in [place] about dosing of antibiotics after Saturday as this would only be 5 days of treatment for cellulitis (typically 7 to 10 days).  Wound culture results should be available at time of follow up with Dr. Patsy Lageropland Friday 9am to 4pm, or Saturday 8am to 3pm. Continue warm compresses, soap and water cleansing 1-2 times per day and change bandage after washing area. Return to the clinic or go to the nearest emergency room if any of your symptoms worsen or new symptoms occur.   Cellulitis Cellulitis is an infection of the skin and the tissue beneath it. The infected area is usually red and tender. Cellulitis  occurs most often in the arms and lower legs.  CAUSES  Cellulitis is caused by bacteria that enter the skin through cracks or cuts in the skin. The most common types of bacteria that cause cellulitis are staphylococci and streptococci. SIGNS AND SYMPTOMS   Redness and warmth.  Swelling.  Tenderness or pain.  Fever. DIAGNOSIS  Your health care provider can usually determine what is wrong based on a physical exam. Blood tests may also be done. TREATMENT  Treatment usually involves taking an antibiotic medicine. HOME CARE INSTRUCTIONS   Take your antibiotic medicine as directed by your health care provider. Finish the antibiotic even if you start to feel better.  Keep the infected arm or leg elevated to reduce swelling.  Apply a warm cloth to the affected area up to 4 times per day to relieve pain.  Take medicines only as directed by your health care provider.  Keep all follow-up visits as directed by your health care provider. SEEK MEDICAL CARE IF:   You notice red streaks coming from the infected area.  Your red area gets larger or turns dark in color.  Your bone or joint underneath the infected area becomes painful after the skin has healed.  Your infection returns in the same area or another area.  You notice a swollen bump in the infected area.  You develop new symptoms.  You have a fever. SEEK IMMEDIATE MEDICAL CARE IF:   You feel very sleepy.  You develop vomiting or diarrhea.  You have a general ill feeling (malaise) with muscle aches and pains. MAKE SURE YOU:   Understand these instructions.  Will watch your condition.  Will  get help right away if you are not doing well or get worse. Document Released: 09/26/2005 Document Revised: 05/03/2014 Document Reviewed: 03/03/2012 [facility] Patient Information 2015 [place], [place]. This information is not intended to replace advice given to you by your health care provider. Make sure you discuss any questions you  have with your health care provider.    I personally performed the services described in this documentation, which was scribed in my presence. The recorded information has been reviewed and considered, and addended by me as needed.

## 2015-03-30 NOTE — Patient Instructions (Signed)
Clarify with your provider in [place] about dosing of antibiotics after Saturday as this would only be 5 days of treatment for cellulitis (typically 7 to 10 days).  Wound culture results should be available at time of follow up with Dr. Patsy Lageropland Friday 9am to 4pm, or Saturday 8am to 3pm. Continue warm compresses, soap and water cleansing 1-2 times per day and change bandage after washing area. Return to the clinic or go to the nearest emergency room if any of your symptoms worsen or new symptoms occur.   Cellulitis Cellulitis is an infection of the skin and the tissue beneath it. The infected area is usually red and tender. Cellulitis occurs most often in the arms and lower legs.  CAUSES  Cellulitis is caused by bacteria that enter the skin through cracks or cuts in the skin. The most common types of bacteria that cause cellulitis are staphylococci and streptococci. SIGNS AND SYMPTOMS   Redness and warmth.  Swelling.  Tenderness or pain.  Fever. DIAGNOSIS  Your health care provider can usually determine what is wrong based on a physical exam. Blood tests may also be done. TREATMENT  Treatment usually involves taking an antibiotic medicine. HOME CARE INSTRUCTIONS   Take your antibiotic medicine as directed by your health care provider. Finish the antibiotic even if you start to feel better.  Keep the infected arm or leg elevated to reduce swelling.  Apply a warm cloth to the affected area up to 4 times per day to relieve pain.  Take medicines only as directed by your health care provider.  Keep all follow-up visits as directed by your health care provider. SEEK MEDICAL CARE IF:   You notice red streaks coming from the infected area.  Your red area gets larger or turns dark in color.  Your bone or joint underneath the infected area becomes painful after the skin has healed.  Your infection returns in the same area or another area.  You notice a swollen bump in the infected  area.  You develop new symptoms.  You have a fever. SEEK IMMEDIATE MEDICAL CARE IF:   You feel very sleepy.  You develop vomiting or diarrhea.  You have a general ill feeling (malaise) with muscle aches and pains. MAKE SURE YOU:   Understand these instructions.  Will watch your condition.  Will get help right away if you are not doing well or get worse. Document Released: 09/26/2005 Document Revised: 05/03/2014 Document Reviewed: 03/03/2012 [facility] Patient Information 2015 [place], [place]. This information is not intended to replace advice given to you by your health care provider. Make sure you discuss any questions you have with your health care provider.

## 2015-04-01 LAB — WOUND CULTURE: GRAM STAIN: NONE SEEN

## 2015-04-02 ENCOUNTER — Ambulatory Visit (INDEPENDENT_AMBULATORY_CARE_PROVIDER_SITE_OTHER): Payer: BLUE CROSS/BLUE SHIELD | Admitting: Family Medicine

## 2015-04-02 VITALS — BP 126/87 | HR 104 | Temp 98.3°F | Resp 16 | Ht 67.0 in | Wt 208.1 lb

## 2015-04-02 DIAGNOSIS — Z5189 Encounter for other specified aftercare: Secondary | ICD-10-CM | POA: Diagnosis not present

## 2015-04-02 DIAGNOSIS — Z79899 Other long term (current) drug therapy: Secondary | ICD-10-CM

## 2015-04-02 NOTE — Progress Notes (Signed)
[facility] [address], [place] [id number] [phone]  Date:  04/02/2015   Name:  Dana CoffinHolly M Lozano   DOB:  24-Jan-1981   MRN:  [id number]  PCP:  No PCP Per Patient    Chief Complaint: Follow-up   History of Present Illness:  Dana Lozano is a 34 y.o. very pleasant female patient who presents with the following:  See OV from 3/29 and 3/30 when she was treated with (home use) IV abx for leg cellultiis. 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   At baseline she is being treated for "chronic lyme disease" by a doctor in [place] with IV abx 3x a week as well as periodic flagyl.    Since her last visit the area on her leg has gotten a lot better.  The redness is much decreased, ulcer seems to be healing.  They are washing it with soap and water and keeping the area covered.  Pt has a history of "lyme nodules" on her legs which appear most consistent with pickers nodules- they think one of these was the nidus of the infection.  Her mother is not sure how she could have gotten this infection, "she almost never leaves the house except to go to doctor's appts."    Pt and her mother (her mother dominates the conversation and does most of the talking for the pt) are wondering what to do next.  Their doctor in [place] has provided them with IV clindamycin to use daily until tomorrow, and they plan to call his office when it is open on Monday.  Pt reports that she does not have any pain in her leg, able to bend the knee normally, no fever, and feels as good as she generally does at baseline.    The pt has a port a cath because of "severe gastroparesis" which prevents her from tolerating po abx.    Patient Active Problem List   Diagnosis Date Noted  . Anemia, iron deficiency 02/05/2013  . NAUSEA AND VOMITING 09/25/2010  . DIARRHEA 09/25/2010  . LYME DISEASE 02/25/2008  . ANXIETY 02/25/2008  . DEPRESSION  02/25/2008  . ALLERGIC RHINITIS, SEASONAL 02/25/2008  . ASTHMA 02/25/2008  . GERD 02/25/2008  . ARTHRITIS 02/25/2008  . HEMORRHOIDS, EXTERNAL 08/13/2007  . ESOPHAGITIS 08/13/2007  . HYPERLIPIDEMIA 03/17/2007  . MIGRAINE HEADACHE 03/17/2007  . IRRITABLE BOWEL SYNDROME 03/17/2007  . INTERSTITIAL CYSTITIS 03/17/2007  . FIBROMYALGIA 03/17/2007  . NEPHROLITHIASIS, HX OF 03/17/2007    Past Medical History  Diagnosis Date  . Lyme disease   . Interstitial cystitis   . Bile salt-induced diarrhea   . GERD (gastroesophageal reflux disease)   . Bile reflux esophagitis   . Fibromyalgia   . Anemia, iron deficiency 02/05/2013  . Seizure   . IBS (irritable bowel syndrome)   . Gastroparesis   . Chronic abdominal pain   . Migraine headache   . Asthma   . GERD (gastroesophageal reflux disease)   . Nausea vomiting and diarrhea     recurrent  . Kidney stones     Past Surgical History  Procedure Laterality Date  . Cholecystectomy    . Port-a-cath removal    . Sinusotomy    . Exploratory laparotomy      History  Substance Use Topics  . Smoking status: Former Smoker    Quit date: 01/16/2003  . Smokeless tobacco: Never Used  .  Alcohol Use: No    Family History  Problem Relation Age of Onset  . Hypertension Father   . Mental illness Father     depression    Allergies  Allergen Reactions  . Reglan [Metoclopramide] Other (See Comments)    Nerve pain  . Doxycycline Nausea And Vomiting  . Ibuprofen Swelling  . Lactose Intolerance (Gi)     Stomach cramps  . Septra [Sulfamethoxazole-Trimethoprim] Other (See Comments)    Severe mouth ulcers, possibly caused seizures    Medication list has been reviewed and updated.  Current Outpatient Prescriptions on File Prior to Visit  Medication Sig Dispense Refill  . buPROPion (WELLBUTRIN XL) 300 MG 24 hr tablet Take 300 mg by mouth every morning.    . cimetidine (TAGAMET) 200 MG tablet Take 200 mg by mouth at bedtime.    . clindamycin  (CLEOCIN) 300 MG capsule Take 1 capsule (300 mg total) by mouth 4 (four) times daily. 40 capsule 0  . clonazePAM (KLONOPIN) 1 MG tablet Take 1 mg by mouth 4 (four) times daily as needed for anxiety.     . colesevelam (WELCHOL) 625 MG tablet Take 625 mg by mouth 2 (two) times daily.     . diazepam (VALIUM) 5 MG tablet Take 5-15 mg by mouth 3 (three) times daily as needed for anxiety (seizure).     . diphenoxylate-atropine (LOMOTIL) 2.5-0.025 MG per tablet Take 2 tablets by mouth 4 (four) times daily as needed (IBS).     Marland Kitchen dronabinol (MARINOL) 10 MG capsule Take 10 mg by mouth 3 (three) times daily as needed.    Marland Kitchen esomeprazole (NEXIUM) 20 MG capsule Take 20 mg by mouth every morning.     Marland Kitchen HYDROcodone-acetaminophen (NORCO) 10-325 MG per tablet Take 1 tablet by mouth every 6 (six) hours as needed for moderate pain or severe pain.     . hydrocortisone (CORTEF) 5 MG tablet Take 5 mg by mouth daily.    . hydrOXYzine (ATARAX/VISTARIL) 25 MG tablet Take 25-50 mg by mouth every evening.     Marland Kitchen L-Methylfolate (DEPLIN) 15 MG TABS Take 15 mg by mouth every evening.     . lamoTRIgine (LAMICTAL) 100 MG tablet Take 200 mg by mouth 2 (two) times daily.    Marland Kitchen levonorgestrel-ethinyl estradiol (SEASONALE) 0.15-0.03 MG tablet Take 1 tablet by mouth every evening.     . loratadine (CLARITIN) 10 MG tablet Take 10 mg by mouth daily as needed for allergies.    Marland Kitchen LORazepam (ATIVAN) 1 MG tablet Take 1 tablet (1 mg total) by mouth 3 (three) times daily as needed for anxiety. 15 tablet 0  . ondansetron (ZOFRAN ODT) 4 MG disintegrating tablet Take 1 tablet (4 mg total) by mouth every 8 (eight) hours as needed for nausea or vomiting. 30 tablet 0  . OXcarbazepine (TRILEPTAL) 150 MG tablet Take 300 mg by mouth 2 (two) times daily.     . pentosan polysulfate (ELMIRON) 100 MG capsule Take 200 mg by mouth 2 (two) times daily.    . pregabalin (LYRICA) 25 MG capsule Take 25 mg by mouth every evening.    . Probiotic Product (PROBIOTIC  DAILY PO) Take 1 capsule by mouth as needed (when any meals are consumed).     . promethazine (PHENERGAN) 25 MG suppository Place 1 suppository (25 mg total) rectally every 6 (six) hours as needed for nausea or vomiting. 10 each 0  . promethazine (PHENERGAN) 25 MG tablet Take 25 mg by mouth 3 (three) times  daily as needed for nausea or vomiting.     Marland Kitchen QUEtiapine (SEROQUEL) 25 MG tablet Take 25 mg by mouth at bedtime as needed (sleep).    . venlafaxine XR (EFFEXOR-XR) 75 MG 24 hr capsule Take 225 mg by mouth every morning.      No current facility-administered medications on file prior to visit.    Review of Systems:  As per HPI- otherwise negative.   Physical Examination: Filed Vitals:   04/02/15 1502  BP: 126/87  Pulse: 104  Temp: 98.3 F (36.8 C)  Resp: 16   Filed Vitals:   04/02/15 1502  Height:  (1.702 m)  Weight: 208 lb 2 oz (94.405 kg)   Body mass index is 32.59 kg/(m^2). Ideal Body Weight: Weight in (lb) to have BMI = 25: 159.3  GEN: WDWN, NAD, Non-toxic, A & O x 3, appears unfit and pale but not unwell HEENT: Atraumatic, Normocephalic. Neck supple. No masses, No LAD. Ears and Nose: No external deformity. CV: RRR, No M/G/R. No JVD. No thrill. No extra heart sounds. PULM: CTA B, no wheezes, crackles, rhonchi. No retractions. No resp. distress. No accessory muscle use. ABD: S, NT, ND EXTR: No c/c/e NEURO Normal gait.  PSYCH: Normally interactive. Conversant. Not depressed or anxious appearing.  Calm demeanor.  There are a few picked nodules on her legs but most do not appear to be infected.  The area of concern on the right thigh, promial to the knee, looks much better per the pt.  Indeed the redness is complelty receded from the outlined area and there is now just a shallow ulcer with some formation of soft eschar at the center.  Cleaned this area with peroxide and redressed with bandage.    Assessment and Plan: Visit for wound check  High risk medication  use Chiyeko is a 34 year old woman who is being treated with chronic high doses of IV abx for "chronic lyme disease" who presented earlier this week with cellulitis of her right leg. Continued on more frequent doses of IV clindamycin  Wound culture grew out clindamycin resistant MRSA.  However fortunately her cellulitis has gotten a lot better despite this resistant pattern.  Due to her unusual and extreme use of abx at this time I will allow her to continue what she is doing on the recommendation of her doctor in [place].  Have gently suggested to pt and her mother that her abx use is NOT harmless, and that she should seek an opinion from a reputable ID specialist regarding her current treatment.  I am concerned that she will have more infections with abx resistant bacteria in the future.  Discussed the likelihood of more infection of this type with pt and her mother.    Signed Abbe Amsterdam, MD

## 2015-04-02 NOTE — Patient Instructions (Signed)
Your leg looks a lot better. Although your MRSA is resistant to clindamycin it still appears to be improving Please contact your ID doctor as soon as you can to get his next recommendation If you are getting worse or have a fever please seek care

## 2015-05-26 ENCOUNTER — Emergency Department (HOSPITAL_COMMUNITY)
Admission: EM | Admit: 2015-05-26 | Discharge: 2015-05-26 | Discharge status: Against Medical Advice | Payer: BLUE CROSS/BLUE SHIELD | Attending: Emergency Medicine | Admitting: Emergency Medicine

## 2015-05-26 ENCOUNTER — Encounter (HOSPITAL_COMMUNITY): Payer: Self-pay | Admitting: Emergency Medicine

## 2015-05-26 DIAGNOSIS — J45909 Unspecified asthma, uncomplicated: Secondary | ICD-10-CM | POA: Insufficient documentation

## 2015-05-26 DIAGNOSIS — R110 Nausea: Secondary | ICD-10-CM | POA: Insufficient documentation

## 2015-05-26 DIAGNOSIS — R531 Weakness: Secondary | ICD-10-CM | POA: Insufficient documentation

## 2015-05-26 DIAGNOSIS — R109 Unspecified abdominal pain: Secondary | ICD-10-CM | POA: Insufficient documentation

## 2015-05-26 DIAGNOSIS — G8929 Other chronic pain: Secondary | ICD-10-CM | POA: Insufficient documentation

## 2015-05-26 LAB — CBC WITH DIFFERENTIAL/PLATELET
Basophils Absolute: 0 10*3/uL (ref 0.0–0.1)
Basophils Relative: 0 % (ref 0–1)
EOS ABS: 0.2 10*3/uL (ref 0.0–0.7)
EOS PCT: 2 % (ref 0–5)
HCT: 41 % (ref 36.0–46.0)
Hemoglobin: 13 g/dL (ref 12.0–15.0)
LYMPHS ABS: 4.6 10*3/uL — AB (ref 0.7–4.0)
LYMPHS PCT: 55 % — AB (ref 12–46)
MCH: 28.8 pg (ref 26.0–34.0)
MCHC: 31.7 g/dL (ref 30.0–36.0)
MCV: 90.7 fL (ref 78.0–100.0)
Monocytes Absolute: 0.5 10*3/uL (ref 0.1–1.0)
Monocytes Relative: 6 % (ref 3–12)
Neutro Abs: 3.1 10*3/uL (ref 1.7–7.7)
Neutrophils Relative %: 37 % — ABNORMAL LOW (ref 43–77)
Platelets: 440 10*3/uL — ABNORMAL HIGH (ref 150–400)
RBC: 4.52 MIL/uL (ref 3.87–5.11)
RDW: 12.6 % (ref 11.5–15.5)
WBC: 8.3 10*3/uL (ref 4.0–10.5)

## 2015-05-26 LAB — COMPREHENSIVE METABOLIC PANEL
ALT: 24 U/L (ref 14–54)
AST: 26 U/L (ref 15–41)
Albumin: 4.1 g/dL (ref 3.5–5.0)
Alkaline Phosphatase: 93 U/L (ref 38–126)
Anion gap: 12 (ref 5–15)
BUN: 10 mg/dL (ref 6–20)
CO2: 22 mmol/L (ref 22–32)
Calcium: 9.5 mg/dL (ref 8.9–10.3)
Chloride: 105 mmol/L (ref 101–111)
Creatinine, Ser: 0.75 mg/dL (ref 0.44–1.00)
GFR calc non Af Amer: >60 mL/min (ref >60)
Glucose, Bld: 101 mg/dL — ABNORMAL HIGH (ref 65–99)
POTASSIUM: 4.3 mmol/L (ref 3.5–5.1)
Sodium: 139 mmol/L (ref 135–145)
TOTAL PROTEIN: 8 g/dL (ref 6.5–8.1)
Total Bilirubin: 0.3 mg/dL (ref 0.3–1.2)

## 2015-05-26 LAB — LIPASE, BLOOD: Lipase: 15 U/L — ABNORMAL LOW (ref 22–51)

## 2015-05-26 NOTE — ED Notes (Signed)
Per EMS: Pt from home.  Called out for weakness, abd pain, and nausea x 1 wk.  Has not been eating well.

## 2015-06-01 ENCOUNTER — Encounter (HOSPITAL_BASED_OUTPATIENT_CLINIC_OR_DEPARTMENT_OTHER): Payer: Self-pay | Admitting: Emergency Medicine

## 2015-06-01 ENCOUNTER — Emergency Department (HOSPITAL_BASED_OUTPATIENT_CLINIC_OR_DEPARTMENT_OTHER): Payer: BLUE CROSS/BLUE SHIELD

## 2015-06-01 ENCOUNTER — Emergency Department (HOSPITAL_BASED_OUTPATIENT_CLINIC_OR_DEPARTMENT_OTHER)
Admission: EM | Admit: 2015-06-01 | Discharge: 2015-06-01 | Disposition: A | Discharge status: Home / Self Care | Payer: BLUE CROSS/BLUE SHIELD | Attending: Emergency Medicine | Admitting: Emergency Medicine

## 2015-06-01 DIAGNOSIS — R002 Palpitations: Secondary | ICD-10-CM | POA: Diagnosis present

## 2015-06-01 DIAGNOSIS — G43909 Migraine, unspecified, not intractable, without status migrainosus: Secondary | ICD-10-CM | POA: Diagnosis not present

## 2015-06-01 DIAGNOSIS — J45909 Unspecified asthma, uncomplicated: Secondary | ICD-10-CM | POA: Insufficient documentation

## 2015-06-01 DIAGNOSIS — Z87442 Personal history of urinary calculi: Secondary | ICD-10-CM | POA: Insufficient documentation

## 2015-06-01 DIAGNOSIS — M797 Fibromyalgia: Secondary | ICD-10-CM | POA: Diagnosis not present

## 2015-06-01 DIAGNOSIS — Z862 Personal history of diseases of the blood and blood-forming organs and certain disorders involving the immune mechanism: Secondary | ICD-10-CM | POA: Diagnosis not present

## 2015-06-01 DIAGNOSIS — Z87891 Personal history of nicotine dependence: Secondary | ICD-10-CM | POA: Diagnosis not present

## 2015-06-01 DIAGNOSIS — Z8619 Personal history of other infectious and parasitic diseases: Secondary | ICD-10-CM | POA: Diagnosis not present

## 2015-06-01 DIAGNOSIS — R112 Nausea with vomiting, unspecified: Secondary | ICD-10-CM | POA: Insufficient documentation

## 2015-06-01 DIAGNOSIS — Z792 Long term (current) use of antibiotics: Secondary | ICD-10-CM | POA: Diagnosis not present

## 2015-06-01 DIAGNOSIS — G8929 Other chronic pain: Secondary | ICD-10-CM | POA: Insufficient documentation

## 2015-06-01 DIAGNOSIS — Z79899 Other long term (current) drug therapy: Secondary | ICD-10-CM | POA: Diagnosis not present

## 2015-06-01 DIAGNOSIS — G40909 Epilepsy, unspecified, not intractable, without status epilepticus: Secondary | ICD-10-CM | POA: Diagnosis not present

## 2015-06-01 DIAGNOSIS — K219 Gastro-esophageal reflux disease without esophagitis: Secondary | ICD-10-CM | POA: Diagnosis not present

## 2015-06-01 DIAGNOSIS — Z87448 Personal history of other diseases of urinary system: Secondary | ICD-10-CM | POA: Insufficient documentation

## 2015-06-01 LAB — I-STAT CG4 LACTIC ACID, ED: LACTIC ACID, VENOUS: 1.46 mmol/L (ref 0.5–2.0)

## 2015-06-01 LAB — CBC WITH DIFFERENTIAL/PLATELET
BASOS ABS: 0 10*3/uL (ref 0.0–0.1)
Basophils Relative: 0 % (ref 0–1)
Eosinophils Absolute: 0.2 10*3/uL (ref 0.0–0.7)
Eosinophils Relative: 2 % (ref 0–5)
HCT: 39.1 % (ref 36.0–46.0)
HEMOGLOBIN: 12.5 g/dL (ref 12.0–15.0)
LYMPHS PCT: 55 % — AB (ref 12–46)
Lymphs Abs: 3.9 10*3/uL (ref 0.7–4.0)
MCH: 29.1 pg (ref 26.0–34.0)
MCHC: 32 g/dL (ref 30.0–36.0)
MCV: 90.9 fL (ref 78.0–100.0)
Monocytes Absolute: 0.5 10*3/uL (ref 0.1–1.0)
Monocytes Relative: 7 % (ref 3–12)
Neutro Abs: 2.6 10*3/uL (ref 1.7–7.7)
Neutrophils Relative %: 36 % — ABNORMAL LOW (ref 43–77)
PLATELETS: 414 10*3/uL — AB (ref 150–400)
RBC: 4.3 MIL/uL (ref 3.87–5.11)
RDW: 12.2 % (ref 11.5–15.5)
WBC: 7.2 10*3/uL (ref 4.0–10.5)

## 2015-06-01 LAB — URINALYSIS, ROUTINE W REFLEX MICROSCOPIC
Bilirubin Urine: NEGATIVE
Glucose, UA: NEGATIVE mg/dL
HGB URINE DIPSTICK: NEGATIVE
KETONES UR: NEGATIVE mg/dL
NITRITE: NEGATIVE
PH: 7.5 (ref 5.0–8.0)
Protein, ur: NEGATIVE mg/dL
Specific Gravity, Urine: 1.027 (ref 1.005–1.030)
UROBILINOGEN UA: 0.2 mg/dL (ref 0.0–1.0)

## 2015-06-01 LAB — COMPREHENSIVE METABOLIC PANEL
ALBUMIN: 4.1 g/dL (ref 3.5–5.0)
ALK PHOS: 83 U/L (ref 38–126)
ALT: 30 U/L (ref 14–54)
AST: 23 U/L (ref 15–41)
Anion gap: 8 (ref 5–15)
BUN: 10 mg/dL (ref 6–20)
CALCIUM: 9.4 mg/dL (ref 8.9–10.3)
CO2: 27 mmol/L (ref 22–32)
Chloride: 101 mmol/L (ref 101–111)
Creatinine, Ser: 0.8 mg/dL (ref 0.44–1.00)
GLUCOSE: 97 mg/dL (ref 65–99)
POTASSIUM: 4.3 mmol/L (ref 3.5–5.1)
Sodium: 136 mmol/L (ref 135–145)
TOTAL PROTEIN: 7.6 g/dL (ref 6.5–8.1)
Total Bilirubin: <0.1 mg/dL — ABNORMAL LOW (ref 0.3–1.2)

## 2015-06-01 LAB — URINE MICROSCOPIC-ADD ON

## 2015-06-01 MED ORDER — PROMETHAZINE HCL 25 MG/ML IJ SOLN
INTRAMUSCULAR | Status: AC
  Filled 2015-06-01: qty 1

## 2015-06-01 MED ORDER — MORPHINE SULFATE 2 MG/ML IJ SOLN
2.0000 mg | Freq: Once | INTRAMUSCULAR | Status: AC
  Administered 2015-06-01: 2 mg via INTRAVENOUS
  Filled 2015-06-01: qty 1

## 2015-06-01 MED ORDER — ONDANSETRON HCL 4 MG/2ML IJ SOLN
4.0000 mg | Freq: Once | INTRAMUSCULAR | Status: AC
  Administered 2015-06-01: 4 mg via INTRAVENOUS
  Filled 2015-06-01: qty 2

## 2015-06-01 MED ORDER — PROMETHAZINE HCL 25 MG/ML IJ SOLN
12.5000 mg | Freq: Once | INTRAMUSCULAR | Status: AC
  Administered 2015-06-01: 12.5 mg via INTRAVENOUS

## 2015-06-01 NOTE — ED Provider Notes (Signed)
CSN: [id number]     Arrival date & time 06/01/15  1451 History   First MD Initiated Contact with Patient 06/01/15 1529     Chief Complaint  Patient presents with  . Palpitations   HPI Dana Lozano is a 33yo woman with PMHx of iron deficiency anemia, hyperlipidemia, chronic lyme disease, anxiety, and depression who presents to the ED with palpitations. [facility] ([facility] after having palpitations, dyspnea, and a syncopal event at home. She states she waited in the ED for several hours and was not seen so she left. She states she has continued to have palpitations associated with dyspnea since that time. She describes the palpitations as a "flutter". She reports substernal chest pain that started 2 days ago, but is now more right-sided. Her lyme disease hx is complicated. She has a right central line where she receives antibiotic therapy 3x/week every 2 weeks. She has had the line since November 2015. She denies fevers, chills, and tenderness around the line site. She notes she has been having difficulty having "blood come back" from the line for several days now. She told her home health nurse who instructed her to come to the ED.    Past Medical History  Diagnosis Date  . Lyme disease   . Interstitial cystitis   . Bile salt-induced diarrhea   . GERD (gastroesophageal reflux disease)   . Bile reflux esophagitis   . Fibromyalgia   . Anemia, iron deficiency 02/05/2013  . Seizure   . IBS (irritable bowel syndrome)   . Gastroparesis   . Chronic abdominal pain   . Migraine headache   . Asthma   . GERD (gastroesophageal reflux disease)   . Nausea vomiting and diarrhea     recurrent  . Kidney stones    Past Surgical History  Procedure Laterality Date  . Cholecystectomy    . Port-a-cath removal    . Sinusotomy    . Exploratory laparotomy     Family History  Problem Relation Age of Onset  . Hypertension Father   . Mental illness Father     depression   History   Substance Use Topics  . Smoking status: Former Smoker    Quit date: 01/16/2003  . Smokeless tobacco: Never Used  . Alcohol Use: No   OB History    No data available     Review of Systems    Allergies  Reglan; Doxycycline; Ibuprofen; Lactose intolerance (gi); and Septra  Home Medications   Prior to Admission medications   Medication Sig Start Date End Date Taking? Authorizing Provider  buPROPion (WELLBUTRIN XL) 300 MG 24 hr tablet Take 300 mg by mouth every morning.    Historical Provider, MD  cimetidine (TAGAMET) 200 MG tablet Take 200 mg by mouth at bedtime.    Historical Provider, MD  clindamycin (CLEOCIN) 300 MG capsule Take 1 capsule (300 mg total) by mouth 4 (four) times daily. 03/29/15   Shade FloodJeffrey R Greene, MD  clonazePAM (KLONOPIN) 1 MG tablet Take 1 mg by mouth 4 (four) times daily as needed for anxiety.     Historical Provider, MD  colesevelam (WELCHOL) 625 MG tablet Take 625 mg by mouth 2 (two) times daily.     Historical Provider, MD  diazepam (VALIUM) 5 MG tablet Take 5-15 mg by mouth 3 (three) times daily as needed for anxiety (seizure).     Historical Provider, MD  diphenoxylate-atropine (LOMOTIL) 2.5-0.025 MG per tablet Take 2 tablets by mouth 4 (  four) times daily as needed (IBS).     Historical Provider, MD  dronabinol (MARINOL) 10 MG capsule Take 10 mg by mouth 3 (three) times daily as needed.    Historical Provider, MD  esomeprazole (NEXIUM) 20 MG capsule Take 20 mg by mouth every morning.     Historical Provider, MD  HYDROcodone-acetaminophen (NORCO) 10-325 MG per tablet Take 1 tablet by mouth every 6 (six) hours as needed for moderate pain or severe pain.     Historical Provider, MD  hydrocortisone (CORTEF) 5 MG tablet Take 5 mg by mouth daily.    Historical Provider, MD  hydrOXYzine (ATARAX/VISTARIL) 25 MG tablet Take 25-50 mg by mouth every evening.     Historical Provider, MD  L-Methylfolate (DEPLIN) 15 MG TABS Take 15 mg by mouth every evening.     Historical  Provider, MD  lamoTRIgine (LAMICTAL) 100 MG tablet Take 200 mg by mouth 2 (two) times daily.    Historical Provider, MD  levonorgestrel-ethinyl estradiol (SEASONALE) 0.15-0.03 MG tablet Take 1 tablet by mouth every evening.     Historical Provider, MD  loratadine (CLARITIN) 10 MG tablet Take 10 mg by mouth daily as needed for allergies.    Historical Provider, MD  LORazepam (ATIVAN) 1 MG tablet Take 1 tablet (1 mg total) by mouth 3 (three) times daily as needed for anxiety. 04/17/14   Rolland Porter, MD  ondansetron (ZOFRAN ODT) 4 MG disintegrating tablet Take 1 tablet (4 mg total) by mouth every 8 (eight) hours as needed for nausea or vomiting. 10/06/14   Pilar Jarvis, MD  OXcarbazepine (TRILEPTAL) 150 MG tablet Take 300 mg by mouth 2 (two) times daily.     Historical Provider, MD  pentosan polysulfate (ELMIRON) 100 MG capsule Take 200 mg by mouth 2 (two) times daily.    Historical Provider, MD  pregabalin (LYRICA) 25 MG capsule Take 25 mg by mouth every evening.    Historical Provider, MD  Probiotic Product (PROBIOTIC DAILY PO) Take 1 capsule by mouth as needed (when any meals are consumed).     Historical Provider, MD  promethazine (PHENERGAN) 25 MG suppository Place 1 suppository (25 mg total) rectally every 6 (six) hours as needed for nausea or vomiting. 04/12/14   Junius Finner, PA-C  promethazine (PHENERGAN) 25 MG tablet Take 25 mg by mouth 3 (three) times daily as needed for nausea or vomiting.  10/31/12   Eber Hong, MD  QUEtiapine (SEROQUEL) 25 MG tablet Take 25 mg by mouth at bedtime as needed (sleep).    Historical Provider, MD  venlafaxine XR (EFFEXOR-XR) 75 MG 24 hr capsule Take 225 mg by mouth every morning.     Historical Provider, MD   BP 122/79 mmHg  Pulse 119  Temp(Src) 100 F (37.8 C) (Oral)  Resp 18  Ht  (1.727 m)  Wt 198 lb (89.812 kg)  BMI 30.11 kg/m2  SpO2 98% Physical Exam General: young woman sitting up in bed, appears diaphoretic and anxious  HEENT: /AT, EOMI,  sclera anicteric, mucus membranes moist Chest: tachycardic in 110s, no m/g/r. No tenderness to palpation of chest. There is a right sided central line that appears clean/dry/intact. Blood is present in the line. No erythema or tenderness in the area.  Pulm: CTA bilaterally, breaths non-labored Abd: BS+, soft, non-tender, non-distended Ext: warm, no edema Neuro: alert and oriented x 3, no focal deficits  ED Course  Procedures (including critical care time)  Labs Review Labs Reviewed  COMPREHENSIVE METABOLIC PANEL - Abnormal; Notable  for the following:    Total Bilirubin <0.1 (*)    All other components within normal limits  URINALYSIS, ROUTINE W REFLEX MICROSCOPIC (NOT AT [facility]) - Abnormal; Notable for the following:    Leukocytes, UA TRACE (*)    All other components within normal limits  CBC WITH DIFFERENTIAL/PLATELET - Abnormal; Notable for the following:    Platelets 414 (*)    Neutrophils Relative % 36 (*)    Lymphocytes Relative 55 (*)    All other components within normal limits  CULTURE, BLOOD (ROUTINE X 2)  CULTURE, BLOOD (ROUTINE X 2)  URINE MICROSCOPIC-ADD ON  I-STAT CG4 LACTIC ACID, ED    Imaging Review Dg Chest Port 1 View  06/01/2015   CLINICAL DATA:  Initial encounter for right-sided chest pain with central line complications. Shortness of breath with palpitations.  EXAM: PORTABLE CHEST - 1 VIEW  COMPARISON:  02/04/2014.  FINDINGS: Right IJ central line tip projects over the expected location of the distal SVC. Vascular congestion noted with prominence of the interstitial markings. No focal airspace consolidation or pleural effusion. Cardiopericardial silhouette is at upper limits of normal for size. Imaged bony structures of the thorax are intact.  IMPRESSION: Vascular congestion with interstitial prominence.   Electronically Signed   By: Kennith Center M.D.   On: 06/01/2015 17:22     EKG Interpretation   Date/Time:  Wednesday June 01 2015 15:04:04 EDT Ventricular  Rate:  118 PR Interval:  166 QRS Duration: 92 QT Interval:  314 QTC Calculation: 440 R Axis:   74 Text Interpretation:  Sinus tachycardia Otherwise normal ECG Confirmed by  BEATON  MD, ROBERT ([id number]) on 06/01/2015 4:12:02 PM      MDM   Final diagnoses:  None    Patient with hx of chronic lyme disease currently receiving antibiotic treatments 3x per week every 2 weeks presenting with palpitations and mild fever. Unclear whether fever from Lyme disease or possibly a line infection. EKG with sinus tachycardia, no evidence of ischemia. CXR shows the line is in the expected location. WBC count and lactic acid are normal. Blood cultures pending. All other labs appear normal. Possible that she has a blood clot present in the line that is preventing blood from coming back.    Will discharge patient home as she is stable and no obvious infection. Recommended to get in contact with her physician in [place] in regards to the line not working properly. Source of her palpitations remains unclear. Patient recommended to come back to the ED if her palpitations worsen or if she has a syncopal episode.   Rich Number, MD IMTS PGY-1 Pager: [phone]   Su Hoff, MD 06/01/15 4540  Nelva Nay, MD 06/01/15 [phone]

## 2015-06-01 NOTE — ED Notes (Signed)
Patient states she has a central line for antibiotic and IV fluids for chronic Lyme disease.  Patient c/o feeling palpitations during IV infusions; also states she is not getting blood return from her line.  States home health nurse checked it yesterday.

## 2015-06-01 NOTE — ED Notes (Signed)
Pt [facility] and left due to wait time in waiting room.

## 2015-06-01 NOTE — ED Notes (Signed)
Attempt to drawn blood cx from central line. Unable.

## 2015-06-01 NOTE — ED Notes (Signed)
Pt states she has had palpitations for about 1 week

## 2015-06-01 NOTE — ED Notes (Signed)
Pt states she was seen in here 6 days ago for passing out, asked if pt asked dr then about palpitations since it has been going on for 1 week, she stated yes and said they told her everything was fine.

## 2015-06-07 LAB — CULTURE, BLOOD (ROUTINE X 2)
Culture: NO GROWTH
Culture: NO GROWTH

## 2015-06-18 ENCOUNTER — Encounter (HOSPITAL_COMMUNITY): Payer: Self-pay | Admitting: Emergency Medicine

## 2015-06-18 ENCOUNTER — Emergency Department (HOSPITAL_COMMUNITY)
Admission: EM | Admit: 2015-06-18 | Discharge: 2015-06-19 | Disposition: A | Discharge status: Home / Self Care | Payer: BLUE CROSS/BLUE SHIELD | Attending: Emergency Medicine | Admitting: Emergency Medicine

## 2015-06-18 DIAGNOSIS — R1012 Left upper quadrant pain: Secondary | ICD-10-CM | POA: Diagnosis not present

## 2015-06-18 DIAGNOSIS — R1111 Vomiting without nausea: Secondary | ICD-10-CM

## 2015-06-18 DIAGNOSIS — R112 Nausea with vomiting, unspecified: Secondary | ICD-10-CM | POA: Diagnosis not present

## 2015-06-18 DIAGNOSIS — N390 Urinary tract infection, site not specified: Secondary | ICD-10-CM | POA: Diagnosis not present

## 2015-06-18 DIAGNOSIS — Z862 Personal history of diseases of the blood and blood-forming organs and certain disorders involving the immune mechanism: Secondary | ICD-10-CM | POA: Diagnosis not present

## 2015-06-18 DIAGNOSIS — M797 Fibromyalgia: Secondary | ICD-10-CM | POA: Diagnosis not present

## 2015-06-18 DIAGNOSIS — G8929 Other chronic pain: Secondary | ICD-10-CM | POA: Diagnosis not present

## 2015-06-18 DIAGNOSIS — G43909 Migraine, unspecified, not intractable, without status migrainosus: Secondary | ICD-10-CM | POA: Insufficient documentation

## 2015-06-18 DIAGNOSIS — J45909 Unspecified asthma, uncomplicated: Secondary | ICD-10-CM | POA: Diagnosis not present

## 2015-06-18 DIAGNOSIS — G40909 Epilepsy, unspecified, not intractable, without status epilepticus: Secondary | ICD-10-CM | POA: Diagnosis not present

## 2015-06-18 DIAGNOSIS — Z87442 Personal history of urinary calculi: Secondary | ICD-10-CM | POA: Diagnosis not present

## 2015-06-18 DIAGNOSIS — K219 Gastro-esophageal reflux disease without esophagitis: Secondary | ICD-10-CM | POA: Diagnosis not present

## 2015-06-18 DIAGNOSIS — R109 Unspecified abdominal pain: Secondary | ICD-10-CM

## 2015-06-18 DIAGNOSIS — Z87891 Personal history of nicotine dependence: Secondary | ICD-10-CM | POA: Insufficient documentation

## 2015-06-18 LAB — URINALYSIS, ROUTINE W REFLEX MICROSCOPIC
Bilirubin Urine: NEGATIVE
Glucose, UA: NEGATIVE mg/dL
Hgb urine dipstick: NEGATIVE
Ketones, ur: NEGATIVE mg/dL
NITRITE: NEGATIVE
PH: 6.5 (ref 5.0–8.0)
Protein, ur: NEGATIVE mg/dL
SPECIFIC GRAVITY, URINE: 1.018 (ref 1.005–1.030)
Urobilinogen, UA: 0.2 mg/dL (ref 0.0–1.0)

## 2015-06-18 LAB — COMPREHENSIVE METABOLIC PANEL
ALBUMIN: 4 g/dL (ref 3.5–5.0)
ALK PHOS: 81 U/L (ref 38–126)
ALT: 28 U/L (ref 14–54)
ANION GAP: 10 (ref 5–15)
AST: 27 U/L (ref 15–41)
BILIRUBIN TOTAL: 0.4 mg/dL (ref 0.3–1.2)
BUN: 10 mg/dL (ref 6–20)
CO2: 23 mmol/L (ref 22–32)
CREATININE: 0.68 mg/dL (ref 0.44–1.00)
Calcium: 9.4 mg/dL (ref 8.9–10.3)
Chloride: 106 mmol/L (ref 101–111)
Glucose, Bld: 96 mg/dL (ref 65–99)
Potassium: 4.1 mmol/L (ref 3.5–5.1)
SODIUM: 139 mmol/L (ref 135–145)
Total Protein: 7.7 g/dL (ref 6.5–8.1)

## 2015-06-18 LAB — LIPASE, BLOOD: Lipase: 15 U/L — ABNORMAL LOW (ref 22–51)

## 2015-06-18 LAB — CBC WITH DIFFERENTIAL/PLATELET
BASOS ABS: 0 10*3/uL (ref 0.0–0.1)
Basophils Relative: 0 % (ref 0–1)
EOS PCT: 1 % (ref 0–5)
Eosinophils Absolute: 0.2 10*3/uL (ref 0.0–0.7)
HCT: 38.5 % (ref 36.0–46.0)
Hemoglobin: 12.1 g/dL (ref 12.0–15.0)
LYMPHS ABS: 4.1 10*3/uL — AB (ref 0.7–4.0)
LYMPHS PCT: 34 % (ref 12–46)
MCH: 28.3 pg (ref 26.0–34.0)
MCHC: 31.4 g/dL (ref 30.0–36.0)
MCV: 90.2 fL (ref 78.0–100.0)
Monocytes Absolute: 0.8 10*3/uL (ref 0.1–1.0)
Monocytes Relative: 6 % (ref 3–12)
NEUTROS ABS: 7.1 10*3/uL (ref 1.7–7.7)
NEUTROS PCT: 59 % (ref 43–77)
PLATELETS: 453 10*3/uL — AB (ref 150–400)
RBC: 4.27 MIL/uL (ref 3.87–5.11)
RDW: 12.6 % (ref 11.5–15.5)
WBC: 12.1 10*3/uL — AB (ref 4.0–10.5)

## 2015-06-18 LAB — URINE MICROSCOPIC-ADD ON

## 2015-06-18 LAB — PREGNANCY, URINE: PREG TEST UR: NEGATIVE

## 2015-06-18 MED ORDER — SODIUM CHLORIDE 0.9 % IV BOLUS (SEPSIS)
1000.0000 mL | Freq: Once | INTRAVENOUS | Status: AC
  Administered 2015-06-18: 1000 mL via INTRAVENOUS

## 2015-06-18 MED ORDER — LORAZEPAM 2 MG/ML IJ SOLN
1.0000 mg | Freq: Once | INTRAMUSCULAR | Status: AC
  Administered 2015-06-18: 1 mg via INTRAVENOUS
  Filled 2015-06-18: qty 1

## 2015-06-18 NOTE — ED Notes (Signed)
PA at bedside.

## 2015-06-18 NOTE — ED Notes (Signed)
Pt still does not have urine for collection

## 2015-06-18 NOTE — ED Provider Notes (Signed)
CSN: [id number]     Arrival date & time 06/18/15  2224 History   First MD Initiated Contact with Patient 06/18/15 2225     Chief Complaint  Patient presents with  . Abdominal Pain  . Emesis     (Consider location/radiation/quality/duration/timing/severity/associated sxs/prior Treatment) HPI Dana Lozano is a 34 year old female past medical history of chronic Lyme disease, I and a Videx via PICC line, interstitial cystitis, fibromyalgia, chronic abdominal pain who presents the ER complaining of one day of nausea, vomiting, abdominal pain. Patient states her nausea and vomiting began gradually last night, and have persisted throughout the night last night and today. Patient states that today her earlier tonight before coming in she noted that her carbon monoxide detector was going off. She states that they called 911 to be transported here. Patient states when she is walking outside she noticed a lot of flashing lights, and "had a seizure". Patient states she was told that she was awake during the seizure, however states she is unable to recall this event. Patient states she has had seizures similar to this in the past, is not currently seen by neurology, states she takes a dose of by mouth Valium as needed for seizures. EMS who brought patient noted that during the seizure patient was talking, and alert. Patient denies chest pain, shortness of breath, fever, dysuria.  Past Medical History  Diagnosis Date  . Lyme disease   . Interstitial cystitis   . Bile salt-induced diarrhea   . GERD (gastroesophageal reflux disease)   . Bile reflux esophagitis   . Fibromyalgia   . Anemia, iron deficiency 02/05/2013  . Seizure   . IBS (irritable bowel syndrome)   . Gastroparesis   . Chronic abdominal pain   . Migraine headache   . Asthma   . GERD (gastroesophageal reflux disease)   . Nausea vomiting and diarrhea     recurrent  . Kidney stones    Past Surgical History  Procedure Laterality Date  .  Cholecystectomy    . Port-a-cath removal    . Sinusotomy    . Exploratory laparotomy     Family History  Problem Relation Age of Onset  . Hypertension Father   . Mental illness Father     depression   History  Substance Use Topics  . Smoking status: Former Smoker    Quit date: 01/16/2003  . Smokeless tobacco: Never Used  . Alcohol Use: No   OB History    No data available     Review of Systems  Constitutional: Negative for fever.  HENT: Negative for trouble swallowing.   Eyes: Negative for visual disturbance.  Respiratory: Negative for shortness of breath.   Cardiovascular: Negative for chest pain.  Gastrointestinal: Positive for nausea, vomiting and abdominal pain.  Genitourinary: Negative for dysuria.  Musculoskeletal: Negative for neck pain.  Skin: Negative for rash.  Neurological: Negative for dizziness, weakness and numbness.  Psychiatric/Behavioral: Negative.     Allergies  Reglan; Doxycycline; Ibuprofen; Lactose intolerance (gi); and Septra  Home Medications   Prior to Admission medications   Medication Sig Start Date End Date Taking? Authorizing Provider  buPROPion (WELLBUTRIN XL) 300 MG 24 hr tablet Take 300 mg by mouth every morning.   Yes Historical Provider, MD  clonazePAM (KLONOPIN) 1 MG tablet Take 2 mg by mouth every evening.    Yes Historical Provider, MD  colesevelam (WELCHOL) 625 MG tablet Take 625 mg by mouth 2 (two) times daily as needed (cholesterol).  Yes Historical Provider, MD  diazepam (VALIUM) 5 MG tablet Take 5-15 mg by mouth 3 (three) times daily as needed for anxiety (seizure).    Yes Historical Provider, MD  diphenoxylate-atropine (LOMOTIL) 2.5-0.025 MG per tablet Take 2 tablets by mouth 4 (four) times daily as needed (IBS).    Yes Historical Provider, MD  dronabinol (MARINOL) 10 MG capsule Take 10 mg by mouth 3 (three) times daily as needed (nausea/bone pain).    Yes Historical Provider, MD  esomeprazole (NEXIUM) 20 MG capsule Take 40  mg by mouth 2 (two) times daily before a meal.    Yes Historical Provider, MD  HYDROcodone-acetaminophen (NORCO) 10-325 MG per tablet Take 1 tablet by mouth every 6 (six) hours as needed for moderate pain or severe pain.    Yes Historical Provider, MD  hydrocortisone (CORTEF) 5 MG tablet Take 5 mg by mouth daily.   Yes Historical Provider, MD  hydrOXYzine (ATARAX/VISTARIL) 25 MG tablet Take 25-50 mg by mouth every evening.    Yes Historical Provider, MD  L-Methylfolate (DEPLIN) 15 MG TABS Take 15 mg by mouth every evening.    Yes Historical Provider, MD  lamoTRIgine (LAMICTAL) 100 MG tablet Take 200 mg by mouth 2 (two) times daily.   Yes Historical Provider, MD  levonorgestrel-ethinyl estradiol (SEASONALE) 0.15-0.03 MG tablet Take 1 tablet by mouth every evening.    Yes Historical Provider, MD  loratadine (CLARITIN) 10 MG tablet Take 10 mg by mouth daily as needed for allergies.   Yes Historical Provider, MD  LORazepam (ATIVAN) 1 MG tablet Take 1 tablet (1 mg total) by mouth 3 (three) times daily as needed for anxiety. 04/17/14  Yes Rolland Porter, MD  OXcarbazepine (TRILEPTAL) 150 MG tablet Take 300 mg by mouth 2 (two) times daily.    Yes Historical Provider, MD  pentosan polysulfate (ELMIRON) 100 MG capsule Take 100 mg by mouth 3 (three) times daily.    Yes Historical Provider, MD  pregabalin (LYRICA) 25 MG capsule Take 25 mg by mouth every evening.   Yes Historical Provider, MD  PRESCRIPTION MEDICATION See admin instructions. IV Zofran: sent to patient from [employer]:  [id number] [address], [place], [place] [address] [phone]   Yes Historical Provider, MD  Probiotic Product (PROBIOTIC DAILY PO) Take 1 capsule by mouth as needed (when any meals are consumed).    Yes Historical Provider, MD  QUEtiapine (SEROQUEL) 25 MG tablet Take 25 mg by mouth at bedtime as needed (sleep).   Yes Historical Provider, MD  venlafaxine XR (EFFEXOR-XR) 75 MG 24 hr capsule Take 225 mg by mouth every morning.     Yes Historical Provider, MD  ciprofloxacin (CIPRO) 500 MG tablet Take 1 tablet (500 mg total) by mouth 2 (two) times daily. One po bid x 7 days 06/19/15   Ladona Mow, PA-C  clindamycin (CLEOCIN) 300 MG capsule Take 1 capsule (300 mg total) by mouth 4 (four) times daily. Patient not taking: Reported on 06/18/2015 03/29/15   Shade Flood, MD  ondansetron (ZOFRAN ODT) 4 MG disintegrating tablet Take 1 tablet (4 mg total) by mouth every 8 (eight) hours as needed for nausea or vomiting. Patient not taking: Reported on 06/18/2015 10/06/14   Pilar Jarvis, MD  promethazine (PHENERGAN) 25 MG suppository Place 1 suppository (25 mg total) rectally every 6 (six) hours as needed for nausea or vomiting. 06/19/15   Ladona Mow, PA-C   BP 115/71 mmHg  Pulse 73  Temp(Src) 98 F (36.7 C) (  Oral)  Resp 16  SpO2 97% Physical Exam  Constitutional: She is oriented to person, place, and time. She appears well-developed and well-nourished. No distress.  HENT:  Head: Normocephalic and atraumatic.  Mouth/Throat: Oropharynx is clear and moist. No oropharyngeal exudate.  Eyes: Right eye exhibits no discharge. Left eye exhibits no discharge. No scleral icterus.  Neck: Normal range of motion.  Cardiovascular: Normal rate, regular rhythm and normal heart sounds.   No murmur heard. Pulmonary/Chest: Effort normal and breath sounds normal. No respiratory distress.  Abdominal: Soft. There is tenderness in the left upper quadrant.  Musculoskeletal: Normal range of motion. She exhibits no edema or tenderness.  Neurological: She is alert and oriented to person, place, and time. No cranial nerve deficit. Coordination normal.  Skin: Skin is warm and dry. No rash noted. She is not diaphoretic.  Psychiatric: She has a normal mood and affect.  Nursing note and vitals reviewed.   ED Course  Procedures (including critical care time) Labs Review Labs Reviewed  CBC WITH DIFFERENTIAL/PLATELET - Abnormal; Notable for the following:     WBC 12.1 (*)    Platelets 453 (*)    Lymphs Abs 4.1 (*)    All other components within normal limits  LIPASE, BLOOD - Abnormal; Notable for the following:    Lipase 15 (*)    All other components within normal limits  URINALYSIS, ROUTINE W REFLEX MICROSCOPIC (NOT AT [facility]) - Abnormal; Notable for the following:    Leukocytes, UA MODERATE (*)    All other components within normal limits  URINE MICROSCOPIC-ADD ON - Abnormal; Notable for the following:    Squamous Epithelial / LPF MANY (*)    Bacteria, UA FEW (*)    All other components within normal limits  COMPREHENSIVE METABOLIC PANEL  PREGNANCY, URINE  CARBOXYHEMOGLOBIN    Imaging Review No results found.   EKG Interpretation   Date/Time:  Saturday June 18 2015 22:28:52 EDT Ventricular Rate:  80 PR Interval:  150 QRS Duration: 98 QT Interval:  373 QTC Calculation: 430 R Axis:   75 Text Interpretation:  Sinus rhythm Baseline wander in lead(s) II III V2 V3  V4 V5 V6 rate has decreased since last tracing Confirmed by Mirian Mo [phone]) on 06/19/2015 2:07:42 AM      MDM   Final diagnoses:  Chronic abdominal pain  UTI (lower urinary tract infection)  Non-intractable vomiting without nausea, vomiting of unspecified type    Patient here with main complaint of upper abdominal pain, nausea and vomiting. Patient reporting her signs and symptoms are difficult to and consistent with previous episodes of chronic abdominal pain she has experienced in the past. Regarding patient's description of her seizure-like activity, as reported by EMS that patient was alert, talking in full sentences throughout her seizure activity. Patient stating she is having a seizure once in here, however speaking to nursing staff when she is having it. There is no incontinence, there is no loss of consciousness. Patient does not have neurology following her seizures, and does not take any ongoing seizure medication. Do not believe patient's symptoms  are consistent with true seizure. Patient has mild tremors, this is improved after Ativan. Patient denies alcohol use. No concern for withdrawal. Patient also reporting some pain consistent with her interstitial cystitis. Likely flareup of chronic pain. Patient has concern about harming monoxide exposure, carboxyhemoglobin note no loss of consciousness, incontinence.  to be within normal limits here. Workup largely unremarkable for acute pathology. Labs within normal limits.  Signs and symptoms subsided significantly with symptomatic therapy here. Patient does note to have pyuria and bacteriuria. She does endorse some mild bilateral flank pain over the last week when I bring this up to her. Given patient's history of chronic cystitis, we'll treat patient for possible pyelonephritis.  Patient is nontoxic, nonseptic appearing, in no apparent distress.  Patient's pain and other symptoms adequately managed in emergency department.  Fluid bolus given.  Labs, imaging and vitals reviewed.  Patient does not meet the SIRS or Sepsis criteria.  On repeat exam patient does not have a surgical abdomin and there are no peritoneal signs.  Patient tolerating  PO well.  No indication of appendicitis, bowel obstruction, bowel perforation, cholecystitis, diverticulitis, PID or ectopic pregnancy.  Patient discharged home with symptomatic treatment and given strict instructions for follow-up with their primary care physician.  I have also discussed reasons to return immediately to the ER.  Patient expresses understanding and agrees with plan.  BP 115/71 mmHg  Pulse 73  Temp(Src) 98 F (36.7 C) (Oral)  Resp 16  SpO2 97%  Signed,  Ladona Mow, PA-C 12:22 AM  Patient discussed with Dr. Mirian Mo, MD     Ladona Mow, PA-C 06/20/15 0022  Mirian Mo, MD 06/23/15 [phone]

## 2015-06-18 NOTE — ED Notes (Signed)
Central line flushes and has good blood return. She is also requesting that cultures be run from the blood from her central line.

## 2015-06-18 NOTE — ED Notes (Addendum)
Pt from home c/o nausea, vomiting, and generalized abdominal pain. She reports to [facility] upon arrival she had CO2 in her system and her CO2 alarm was going off. Per [facility] their monitor detected zero. 4 mg Zofran Im given. Pt has central line on the right arm. She reports to [facility] that she is having a seizure while talking to [facility]. Pt has central line due to chronic Lyme disease. She reports she sees a Materials engineer in [place]. Once a month who manages her care. She reports last antibiotic use was 2 months.

## 2015-06-19 LAB — CARBOXYHEMOGLOBIN
CARBOXYHEMOGLOBIN: 0.6 % (ref 0.5–1.5)
Methemoglobin: 1 % (ref 0.0–1.5)
O2 Saturation: 94.6 %
Total hemoglobin: 12 g/dL (ref 12.0–16.0)

## 2015-06-19 MED ORDER — HEPARIN SOD (PORK) LOCK FLUSH 100 UNIT/ML IV SOLN
500.0000 [IU] | Freq: Once | INTRAVENOUS | Status: AC
  Administered 2015-06-19: 500 [IU]
  Filled 2015-06-19: qty 5

## 2015-06-19 MED ORDER — PROMETHAZINE HCL 25 MG/ML IJ SOLN
12.5000 mg | Freq: Once | INTRAMUSCULAR | Status: AC
  Administered 2015-06-19: 12.5 mg via INTRAVENOUS
  Filled 2015-06-19: qty 1

## 2015-06-19 MED ORDER — CIPROFLOXACIN HCL 500 MG PO TABS: 500.0000 mg | ORAL_TABLET | Freq: Two times a day (BID) | ORAL | Status: DC

## 2015-06-19 MED ORDER — FENTANYL CITRATE (PF) 100 MCG/2ML IJ SOLN
50.0000 ug | Freq: Once | INTRAMUSCULAR | Status: AC
  Administered 2015-06-19: 50 ug via INTRAVENOUS
  Filled 2015-06-19: qty 2

## 2015-06-19 MED ORDER — PROMETHAZINE HCL 25 MG RE SUPP: 25.0000 mg | Freq: Four times a day (QID) | RECTAL | Status: DC | PRN

## 2015-06-19 NOTE — ED Notes (Signed)
Pt alert, oriented, and wheeled to lobby by NT Johny Drilling. Mother driving patient home.

## 2015-06-19 NOTE — ED Notes (Signed)
Respiratory called for Carboxyhemoglobin collection. Spoke to Dana Lozano he reports he will be here shortly.

## 2015-06-19 NOTE — ED Notes (Signed)
Pt's mother stated that she did not want ED staff to use pt's central line while pt was here. This RN informed her that we could start an IV on pt. Both pt and pt's mother refused this and agreed to let staff use central line.

## 2015-06-19 NOTE — Discharge Instructions (Signed)
Abdominal Pain °Many things can cause abdominal pain. Usually, abdominal pain is not caused by a disease and will improve without treatment. It can often be observed and treated at home. Your health care provider will do a physical exam and possibly order blood tests and X-rays to help determine the seriousness of your pain. However, in many cases, more time must pass before a clear cause of the pain can be found. Before that point, your health care provider may not know if you need more testing or further treatment. °HOME CARE INSTRUCTIONS  °Monitor your abdominal pain for any changes. The following actions may help to alleviate any discomfort you are experiencing: °· Only take over-the-counter or prescription medicines as directed by your health care provider. °· Do not take laxatives unless directed to do so by your health care provider. °· Try a clear liquid diet (broth, tea, or water) as directed by your health care provider. Slowly move to a bland diet as tolerated. °SEEK MEDICAL CARE IF: °· You have unexplained abdominal pain. °· You have abdominal pain associated with nausea or diarrhea. °· You have pain when you urinate or have a bowel movement. °· You experience abdominal pain that wakes you in the night. °· You have abdominal pain that is worsened or improved by eating food. °· You have abdominal pain that is worsened with eating fatty foods. °· You have a fever. °SEEK IMMEDIATE MEDICAL CARE IF:  °· Your pain does not go away within 2 hours. °· You keep throwing up (vomiting). °· Your pain is felt only in portions of the abdomen, such as the right side or the left lower portion of the abdomen. °· You pass bloody or black tarry stools. °MAKE SURE YOU: °· Understand these instructions.   °· Will watch your condition.   °· Will get help right away if you are not doing well or get worse.   °Document Released: 09/26/2005 Document Revised: 12/22/2013 Document Reviewed: 08/26/2013 °ExitCare® Patient Information  ©2015 [employer]. This information is not intended to replace advice given to you by your health care provider. Make sure you discuss any questions you have with your health care provider. °Urinary Tract Infection °Urinary tract infections (UTIs) can develop anywhere along your urinary tract. Your urinary tract is your body's drainage system for removing wastes and extra water. Your urinary tract includes two kidneys, two ureters, a bladder, and a urethra. Your kidneys are a pair of bean-shaped organs. Each kidney is about the size of your fist. They are located below your ribs, one on each side of your spine. °CAUSES °Infections are caused by microbes, which are microscopic organisms, including fungi, viruses, and bacteria. These organisms are so small that they can only be seen through a microscope. Bacteria are the microbes that most commonly cause UTIs. °SYMPTOMS  °Symptoms of UTIs may vary by age and gender of the patient and by the location of the infection. Symptoms in young women typically include a frequent and intense urge to urinate and a painful, burning feeling in the bladder or urethra during urination. Older women and men are more likely to be tired, shaky, and weak and have muscle aches and abdominal pain. A fever may mean the infection is in your kidneys. Other symptoms of a kidney infection include pain in your back or sides below the ribs, nausea, and vomiting. °DIAGNOSIS °To diagnose a UTI, your caregiver will ask you about your symptoms. Your caregiver also will ask to provide a urine sample. The urine sample   will be tested for bacteria and white blood cells. White blood cells are made by your body to help fight infection. °TREATMENT  °Typically, UTIs can be treated with medication. Because most UTIs are caused by a bacterial infection, they usually can be treated with the use of antibiotics. The choice of antibiotic and length of treatment depend on your symptoms and the type of bacteria  causing your infection. °HOME CARE INSTRUCTIONS °· If you were prescribed antibiotics, take them exactly as your caregiver instructs you. Finish the medication even if you feel better after you have only taken some of the medication. °· Drink enough water and fluids to keep your urine clear or pale yellow. °· Avoid caffeine, tea, and carbonated beverages. They tend to irritate your bladder. °· Empty your bladder often. Avoid holding urine for long periods of time. °· Empty your bladder before and after sexual intercourse. °· After a bowel movement, women should cleanse from front to back. Use each tissue only once. °SEEK MEDICAL CARE IF:  °· You have back pain. °· You develop a fever. °· Your symptoms do not begin to resolve within 3 days. °SEEK IMMEDIATE MEDICAL CARE IF:  °· You have severe back pain or lower abdominal pain. °· You develop chills. °· You have nausea or vomiting. °· You have continued burning or discomfort with urination. °MAKE SURE YOU:  °· Understand these instructions. °· Will watch your condition. °· Will get help right away if you are not doing well or get worse. °Document Released: 09/26/2005 Document Revised: 06/17/2012 Document Reviewed: 01/25/2012 °ExitCare® Patient Information ©2015 [employer]. This information is not intended to replace advice given to you by your health care provider. Make sure you discuss any questions you have with your health care provider. ° °

## 2015-06-30 ENCOUNTER — Encounter (HOSPITAL_COMMUNITY): Payer: Self-pay | Admitting: Emergency Medicine

## 2015-06-30 ENCOUNTER — Emergency Department (HOSPITAL_COMMUNITY): Payer: BLUE CROSS/BLUE SHIELD

## 2015-06-30 ENCOUNTER — Emergency Department (HOSPITAL_COMMUNITY)
Admission: EM | Admit: 2015-06-30 | Discharge: 2015-06-30 | Disposition: A | Discharge status: Home / Self Care | Payer: BLUE CROSS/BLUE SHIELD | Attending: Emergency Medicine | Admitting: Emergency Medicine

## 2015-06-30 DIAGNOSIS — Z9049 Acquired absence of other specified parts of digestive tract: Secondary | ICD-10-CM | POA: Diagnosis not present

## 2015-06-30 DIAGNOSIS — Z87442 Personal history of urinary calculi: Secondary | ICD-10-CM | POA: Insufficient documentation

## 2015-06-30 DIAGNOSIS — M797 Fibromyalgia: Secondary | ICD-10-CM | POA: Insufficient documentation

## 2015-06-30 DIAGNOSIS — G43909 Migraine, unspecified, not intractable, without status migrainosus: Secondary | ICD-10-CM | POA: Insufficient documentation

## 2015-06-30 DIAGNOSIS — Z862 Personal history of diseases of the blood and blood-forming organs and certain disorders involving the immune mechanism: Secondary | ICD-10-CM | POA: Insufficient documentation

## 2015-06-30 DIAGNOSIS — M549 Dorsalgia, unspecified: Secondary | ICD-10-CM | POA: Insufficient documentation

## 2015-06-30 DIAGNOSIS — G40909 Epilepsy, unspecified, not intractable, without status epilepticus: Secondary | ICD-10-CM | POA: Diagnosis not present

## 2015-06-30 DIAGNOSIS — R319 Hematuria, unspecified: Secondary | ICD-10-CM | POA: Diagnosis not present

## 2015-06-30 DIAGNOSIS — Z79899 Other long term (current) drug therapy: Secondary | ICD-10-CM | POA: Insufficient documentation

## 2015-06-30 DIAGNOSIS — Z792 Long term (current) use of antibiotics: Secondary | ICD-10-CM | POA: Diagnosis not present

## 2015-06-30 DIAGNOSIS — Z87891 Personal history of nicotine dependence: Secondary | ICD-10-CM | POA: Insufficient documentation

## 2015-06-30 DIAGNOSIS — Z8619 Personal history of other infectious and parasitic diseases: Secondary | ICD-10-CM | POA: Insufficient documentation

## 2015-06-30 DIAGNOSIS — J45909 Unspecified asthma, uncomplicated: Secondary | ICD-10-CM | POA: Insufficient documentation

## 2015-06-30 DIAGNOSIS — Z3202 Encounter for pregnancy test, result negative: Secondary | ICD-10-CM | POA: Insufficient documentation

## 2015-06-30 DIAGNOSIS — G8929 Other chronic pain: Secondary | ICD-10-CM | POA: Diagnosis not present

## 2015-06-30 DIAGNOSIS — R079 Chest pain, unspecified: Secondary | ICD-10-CM | POA: Insufficient documentation

## 2015-06-30 DIAGNOSIS — R531 Weakness: Secondary | ICD-10-CM | POA: Diagnosis not present

## 2015-06-30 DIAGNOSIS — R5383 Other fatigue: Secondary | ICD-10-CM | POA: Insufficient documentation

## 2015-06-30 DIAGNOSIS — K210 Gastro-esophageal reflux disease with esophagitis: Secondary | ICD-10-CM | POA: Diagnosis not present

## 2015-06-30 DIAGNOSIS — R1110 Vomiting, unspecified: Secondary | ICD-10-CM | POA: Diagnosis not present

## 2015-06-30 DIAGNOSIS — R569 Unspecified convulsions: Secondary | ICD-10-CM

## 2015-06-30 DIAGNOSIS — R109 Unspecified abdominal pain: Secondary | ICD-10-CM

## 2015-06-30 LAB — CBC WITH DIFFERENTIAL/PLATELET
Basophils Absolute: 0 10*3/uL (ref 0.0–0.1)
Basophils Relative: 0 % (ref 0–1)
EOS PCT: 3 % (ref 0–5)
Eosinophils Absolute: 0.3 10*3/uL (ref 0.0–0.7)
HCT: 39.4 % (ref 36.0–46.0)
Hemoglobin: 12.6 g/dL (ref 12.0–15.0)
LYMPHS ABS: 6.1 10*3/uL — AB (ref 0.7–4.0)
Lymphocytes Relative: 58 % — ABNORMAL HIGH (ref 12–46)
MCH: 28.7 pg (ref 26.0–34.0)
MCHC: 32 g/dL (ref 30.0–36.0)
MCV: 89.7 fL (ref 78.0–100.0)
MONO ABS: 0.7 10*3/uL (ref 0.1–1.0)
Monocytes Relative: 7 % (ref 3–12)
NEUTROS ABS: 3.3 10*3/uL (ref 1.7–7.7)
Neutrophils Relative %: 32 % — ABNORMAL LOW (ref 43–77)
Platelets: 422 10*3/uL — ABNORMAL HIGH (ref 150–400)
RBC: 4.39 MIL/uL (ref 3.87–5.11)
RDW: 12.7 % (ref 11.5–15.5)
WBC: 10.4 10*3/uL (ref 4.0–10.5)

## 2015-06-30 LAB — COMPREHENSIVE METABOLIC PANEL
ALT: 21 U/L (ref 14–54)
ANION GAP: 12 (ref 5–15)
AST: 23 U/L (ref 15–41)
Albumin: 4.1 g/dL (ref 3.5–5.0)
Alkaline Phosphatase: 82 U/L (ref 38–126)
BUN: 12 mg/dL (ref 6–20)
CO2: 24 mmol/L (ref 22–32)
CREATININE: 0.8 mg/dL (ref 0.44–1.00)
Calcium: 9.6 mg/dL (ref 8.9–10.3)
Chloride: 101 mmol/L (ref 101–111)
GFR calc Af Amer: >60 mL/min (ref >60)
GLUCOSE: 102 mg/dL — AB (ref 65–99)
POTASSIUM: 4.4 mmol/L (ref 3.5–5.1)
Sodium: 137 mmol/L (ref 135–145)
TOTAL PROTEIN: 8 g/dL (ref 6.5–8.1)
Total Bilirubin: 0.2 mg/dL — ABNORMAL LOW (ref 0.3–1.2)

## 2015-06-30 LAB — URINALYSIS, ROUTINE W REFLEX MICROSCOPIC
Bilirubin Urine: NEGATIVE
Glucose, UA: NEGATIVE mg/dL
HGB URINE DIPSTICK: NEGATIVE
Ketones, ur: NEGATIVE mg/dL
NITRITE: NEGATIVE
PROTEIN: NEGATIVE mg/dL
Specific Gravity, Urine: 1.016 (ref 1.005–1.030)
Urobilinogen, UA: 0.2 mg/dL (ref 0.0–1.0)
pH: 7.5 (ref 5.0–8.0)

## 2015-06-30 LAB — POC URINE PREG, ED: Preg Test, Ur: NEGATIVE

## 2015-06-30 LAB — RAPID URINE DRUG SCREEN, HOSP PERFORMED
Amphetamines: NOT DETECTED
Barbiturates: NOT DETECTED
Benzodiazepines: POSITIVE — AB
COCAINE: NOT DETECTED
Opiates: POSITIVE — AB
Tetrahydrocannabinol: POSITIVE — AB

## 2015-06-30 LAB — URINE MICROSCOPIC-ADD ON

## 2015-06-30 LAB — LIPASE, BLOOD: Lipase: 17 U/L — ABNORMAL LOW (ref 22–51)

## 2015-06-30 LAB — I-STAT TROPONIN, ED: Troponin i, poc: 0 ng/mL (ref 0.00–0.08)

## 2015-06-30 MED ORDER — ONDANSETRON HCL 4 MG/2ML IJ SOLN
4.0000 mg | Freq: Once | INTRAMUSCULAR | Status: AC
  Administered 2015-06-30: 4 mg via INTRAVENOUS
  Filled 2015-06-30: qty 2

## 2015-06-30 MED ORDER — HEPARIN SOD (PORK) LOCK FLUSH 100 UNIT/ML IV SOLN
500.0000 [IU] | Freq: Once | INTRAVENOUS | Status: AC
  Administered 2015-06-30: 500 [IU]
  Filled 2015-06-30: qty 5

## 2015-06-30 MED ORDER — LORAZEPAM 2 MG/ML IJ SOLN
INTRAMUSCULAR | Status: AC
  Filled 2015-06-30: qty 1

## 2015-06-30 MED ORDER — LORAZEPAM 2 MG/ML IJ SOLN
1.0000 mg | Freq: Once | INTRAMUSCULAR | Status: AC
  Administered 2015-06-30: 1 mg via INTRAVENOUS
  Filled 2015-06-30: qty 1

## 2015-06-30 NOTE — ED Notes (Signed)
Provider in room talking with patient; the pt is responding appropriately and answering questions in complete sentences.  Denies vomiting, HA, recent falls and injuries.  She reports severe abdominal and back pain and new onset weakness in bilateral legs. Pt has a port for Lyme disease tmt. Pt reports that she smokes "medicinally" but denies drug and alcohol use. Additional history reported as interstitial cystitis, gastroparesis, kidney stones.

## 2015-06-30 NOTE — ED Notes (Signed)
When entering triage room to take patient back to acute exam room, pt noted to have some seizure-like activity. Pt on monitor and vitals being cycled. Pt taken back to room, seizure precautions in place, MD to bedside.

## 2015-06-30 NOTE — ED Notes (Signed)
Pt refuses EKG.

## 2015-06-30 NOTE — ED Notes (Signed)
Pt is complaining of pain rated 10/10 in lower abdomen and right flank.  Will notify MD.

## 2015-06-30 NOTE — ED Provider Notes (Signed)
CSN: [id number]     Arrival date & time 06/30/15  1437 History   First MD Initiated Contact with Patient 06/30/15 1527     Chief Complaint  Patient presents with  . Hematuria  . Emesis  . Back Pain    Patient is a 34 y.o. female presenting with hematuria, vomiting, and back pain. The history is provided by the patient.  Hematuria This is a new problem. Episode onset: Past several days. The problem occurs daily. The problem has been gradually worsening. Associated symptoms include chest pain and abdominal pain. Pertinent negatives include no headaches. Associated symptoms comments: Brief CP, none at this time . Exacerbated by: urination. Nothing relieves the symptoms.  Emesis Associated symptoms: abdominal pain   Associated symptoms: no headaches   Back Pain Associated symptoms: abdominal pain and chest pain   Associated symptoms: no headaches   Pt presents for multiple medical complaints: She reports recent hematuria with associated back pain - she reports she feels she may be getting a "kidney infection" She also reports generalized weakness/fatigue She reported brief episode of CP No fever No Ha No falls She reports h/o seizures and felt as though she would have one today   Past Medical History  Diagnosis Date  . Lyme disease   . Interstitial cystitis   . Bile salt-induced diarrhea   . GERD (gastroesophageal reflux disease)   . Bile reflux esophagitis   . Fibromyalgia   . Anemia, iron deficiency 02/05/2013  . Seizure   . IBS (irritable bowel syndrome)   . Gastroparesis   . Chronic abdominal pain   . Migraine headache   . Asthma   . GERD (gastroesophageal reflux disease)   . Nausea vomiting and diarrhea     recurrent  . Kidney stones    Past Surgical History  Procedure Laterality Date  . Cholecystectomy    . Port-a-cath removal    . Sinusotomy    . Exploratory laparotomy     Family History  Problem Relation Age of Onset  . Hypertension Father   . Mental  illness Father     depression   History  Substance Use Topics  . Smoking status: Former Smoker    Quit date: 01/16/2003  . Smokeless tobacco: Never Used  . Alcohol Use: No   OB History    No data available     Review of Systems  Constitutional: Positive for fatigue.  Cardiovascular: Positive for chest pain.  Gastrointestinal: Positive for vomiting and abdominal pain.  Genitourinary: Positive for hematuria. Negative for vaginal bleeding.  Musculoskeletal: Positive for back pain.  Neurological: Negative for headaches.  All other systems reviewed and are negative.     Allergies  Reglan; Doxycycline; Ibuprofen; Lactose intolerance (gi); and Septra  Home Medications   Prior to Admission medications   Medication Sig Start Date End Date Taking? Authorizing Provider  buPROPion (WELLBUTRIN XL) 300 MG 24 hr tablet Take 300 mg by mouth every morning.   Yes Historical Provider, MD  cephALEXin (KEFLEX) 500 MG capsule Take 500 mg by mouth 3 (three) times daily.   Yes Historical Provider, MD  clonazePAM (KLONOPIN) 1 MG tablet Take 2 mg by mouth every evening.    Yes Historical Provider, MD  colesevelam (WELCHOL) 625 MG tablet Take 625 mg by mouth 2 (two) times daily as needed (cholesterol).    Yes Historical Provider, MD  diazepam (VALIUM) 5 MG tablet Take 5-15 mg by mouth 3 (three) times daily as needed (seizure).  Yes Historical Provider, MD  diphenoxylate-atropine (LOMOTIL) 2.5-0.025 MG per tablet Take 2 tablets by mouth 4 (four) times daily as needed (IBS).    Yes Historical Provider, MD  dronabinol (MARINOL) 10 MG capsule Take 10 mg by mouth 3 (three) times daily as needed (nausea/bone pain).    Yes Historical Provider, MD  esomeprazole (NEXIUM) 20 MG capsule Take 40 mg by mouth 2 (two) times daily before a meal.    Yes Historical Provider, MD  HYDROcodone-acetaminophen (NORCO) 10-325 MG per tablet Take 1 tablet by mouth every 6 (six) hours as needed for moderate pain or severe pain.     Yes Historical Provider, MD  hydrocortisone (CORTEF) 5 MG tablet Take 5 mg by mouth daily.   Yes Historical Provider, MD  hydrOXYzine (ATARAX/VISTARIL) 25 MG tablet Take 25-50 mg by mouth every evening.    Yes Historical Provider, MD  L-Methylfolate (DEPLIN) 15 MG TABS Take 15 mg by mouth every evening.    Yes Historical Provider, MD  lamoTRIgine (LAMICTAL) 100 MG tablet Take 200 mg by mouth 2 (two) times daily.   Yes Historical Provider, MD  levonorgestrel-ethinyl estradiol (SEASONALE) 0.15-0.03 MG tablet Take 1 tablet by mouth every evening.    Yes Historical Provider, MD  loratadine (CLARITIN) 10 MG tablet Take 10 mg by mouth daily as needed for allergies.   Yes Historical Provider, MD  OXcarbazepine (TRILEPTAL) 150 MG tablet Take 300 mg by mouth 2 (two) times daily.    Yes Historical Provider, MD  pentosan polysulfate (ELMIRON) 100 MG capsule Take 100 mg by mouth 3 (three) times daily.    Yes Historical Provider, MD  pregabalin (LYRICA) 25 MG capsule Take 25 mg by mouth every evening.   Yes Historical Provider, MD  PRESCRIPTION MEDICATION See admin instructions. IV Zofran: sent to patient from [employer]:  [id number], [place], [place] [address] [phone]   Yes Historical Provider, MD  Probiotic Product (PROBIOTIC DAILY PO) Take 1 capsule by mouth as needed (when any meals are consumed).    Yes Historical Provider, MD  promethazine (PHENERGAN) 25 MG suppository Place 1 suppository (25 mg total) rectally every 6 (six) hours as needed for nausea or vomiting. 06/19/15  Yes Ladona Mow, PA-C  QUEtiapine (SEROQUEL) 25 MG tablet Take 25 mg by mouth at bedtime as needed (sleep).   Yes Historical Provider, MD  venlafaxine XR (EFFEXOR-XR) 75 MG 24 hr capsule Take 225 mg by mouth every morning.    Yes Historical Provider, MD  ciprofloxacin (CIPRO) 500 MG tablet Take 1 tablet (500 mg total) by mouth 2 (two) times daily. One po bid x 7 days Patient not taking: Reported on 06/30/2015  06/19/15   Ladona Mow, PA-C  clindamycin (CLEOCIN) 300 MG capsule Take 1 capsule (300 mg total) by mouth 4 (four) times daily. Patient not taking: Reported on 06/18/2015 03/29/15   Shade Flood, MD  LORazepam (ATIVAN) 1 MG tablet Take 1 tablet (1 mg total) by mouth 3 (three) times daily as needed for anxiety. Patient not taking: Reported on 06/30/2015 04/17/14   Rolland Porter, MD  ondansetron (ZOFRAN ODT) 4 MG disintegrating tablet Take 1 tablet (4 mg total) by mouth every 8 (eight) hours as needed for nausea or vomiting. Patient not taking: Reported on 06/18/2015 10/06/14   Pilar Jarvis, MD   BP 127/84 mmHg  Pulse 112  Temp(Src) 99.1 F (37.3 C) (Oral)  Resp 18  SpO2 98% Physical Exam CONSTITUTIONAL: anxious HEAD: Normocephalic/atraumatic EYES: EOMI/PERRL  ENMT: Mucous membranes moist NECK: supple no meningeal signs SPINE/BACK:entire spine nontender CV: S1/S2 noted, no murmurs/rubs/gallops noted Chest - port in place in right upper chest LUNGS: Lungs are clear to auscultation bilaterally, no apparent distress ABDOMEN: soft, nontender, no rebound or guarding, bowel sounds noted throughout abdomen FA:OZHYQ cva tenderness NEURO: Pt is awake/alert/appropriate, moves all extremitiesx4.  No facial droop.   EXTREMITIES: pulses normal/equal, full ROM SKIN: warm, color normal PSYCH: anxious  ED Course  Procedures  4:58 PM Called to room for possible seizure On my arrival, pt awake/alert, conversant and no seizure activity on my evaluation Nursing reports she was conscious during SZ activity Her main complaint is hematuria and flank pain concern for UTI She has h/o interstitial cystitis - treated at [facility] She has h/o lyme disease and has IV port in place and travels to [place] for treatment Ativan ordered for possible SZ activity (she reports she takes trileptal for SZ) 5:41 PM On reassessment, pt resting comfortably.  Heart rate on my eval was 90s She can ambulate without  difficulty She reports abd pain, but this appears chronic I doubt acute abdominal/urologic/gyn emergency Advised to call her specialist at [facility] Review Labs Reviewed  CBC WITH DIFFERENTIAL/PLATELET - Abnormal; Notable for the following:    Platelets 422 (*)    Neutrophils Relative % 32 (*)    Lymphocytes Relative 58 (*)    Lymphs Abs 6.1 (*)    All other components within normal limits  COMPREHENSIVE METABOLIC PANEL - Abnormal; Notable for the following:    Glucose, Bld 102 (*)    Total Bilirubin 0.2 (*)    All other components within normal limits  LIPASE, BLOOD - Abnormal; Notable for the following:    Lipase 17 (*)    All other components within normal limits  URINALYSIS, ROUTINE W REFLEX MICROSCOPIC (NOT AT [facility]) - Abnormal; Notable for the following:    Leukocytes, UA SMALL (*)    All other components within normal limits  URINE RAPID DRUG SCREEN, HOSP PERFORMED - Abnormal; Notable for the following:    Opiates POSITIVE (*)    Benzodiazepines POSITIVE (*)    Tetrahydrocannabinol POSITIVE (*)    All other components within normal limits  URINE MICROSCOPIC-ADD ON - Abnormal; Notable for the following:    Squamous Epithelial / LPF FEW (*)    All other components within normal limits  POC URINE PREG, ED  Rosezena Sensor, ED    Imaging Review Dg Chest 2 View  06/30/2015   CLINICAL DATA:  Chest pain today.  History of seizures.  EXAM: CHEST  2 VIEW  COMPARISON:  Single view of the chest 06/01/2015. PA and lateral chest 02/04/2014.  FINDINGS: Right IJ catheter remains in place. The lungs are clear. Heart size is normal. There is no pneumothorax or pleural effusion.  IMPRESSION: No acute disease.   Electronically Signed   By: Drusilla Kanner M.D.   On: 06/30/2015 16:19     EKG Interpretation   Date/Time:  Thursday June 30 2015 15:10:23 EDT Ventricular Rate:  121 PR Interval:  158 QRS Duration: 91 QT Interval:  331 QTC Calculation: 470 R Axis:   84 Text  Interpretation:  Sinus tachycardia Atrial premature complex Baseline  wander in lead(s) III V4 V5 artifact noted Confirmed by Bebe Shaggy  MD,  Freeland Pracht ([id number]) on 06/30/2015 3:28:33 PM      MDM   Final diagnoses:  Abdominal pain, unspecified abdominal location  Flank pain  Seizure  Nursing notes including past medical history and social history reviewed and considered in documentation xrays/imaging reviewed by myself and considered during evaluation Labs/vital reviewed myself and considered during evaluation xrays/imaging reviewed by myself and considered during evaluation   Zadie Rhineonald Atalia Litzinger, MD 06/30/15 1745

## 2015-06-30 NOTE — ED Notes (Addendum)
Pt appears to be actively seizing in room but is conscious.

## 2015-06-30 NOTE — Discharge Instructions (Signed)
Please be aware you may have another seizure  Do not drive until seen by your physician for your condition  Do not climb ladders/roofs/trees as a seizure can occur at that height and cause serious harm  Do not bathe/swim alone as a seizure can occur and cause serious harm  Please followup with your physician or neurologist for further testing and possible treatment   SEEK IMMEDIATE MEDICAL ATTENTION IF: The pain does not go away or becomes severe, particularly over the next 8-12 hours.  A temperature above 100.85F develops.  Repeated vomiting occurs (multiple episodes).   Blood is being passed in stools or vomit (bright red or black tarry stools).  Return also if you develop chest pain, difficulty breathing, dizziness or fainting, or become confused, poorly responsive, or inconsolable.

## 2015-06-30 NOTE — ED Notes (Signed)
Bed: [facility] Expected date:  Expected time:  Means of arrival:  Comments: Pt

## 2015-06-30 NOTE — ED Notes (Signed)
Pt c/o hematuria, urinary incontinence, and urinary frequency, nausea, emesis, back pain, shaking, generalized body aches, and states she worries she is "going to have a seizure." Pt states she takes IV fluids, antiemetics through her PICC line, along with oral Abx for UTI and chronic lyme disease. Pt states she had emesis immediately after her last Abx dose.

## 2015-07-06 LAB — PATHOLOGIST SMEAR REVIEW

## 2015-09-15 ENCOUNTER — Telehealth: Payer: Self-pay | Admitting: *Deleted

## 2015-09-15 NOTE — Telephone Encounter (Signed)
Office received request via fax from Dr Abner Greenspan, asking that this office provide patient with IV iron infusions. Patient has been seen by this office, most recent visit 04/10/2013. Dr Myna Hidalgo is good with providing this patient's therapy and patient can be seen by NP to re-establish care.  Attempted to call patient on three occassions on the week of September 5th, with no answer and no ability to leave message due to mailbox being full. Re-attempted today, and there was still no answer, however a message was left for patient to call this office back to schedule her infusions.   Currently awaiting call-back.

## 2015-12-30 ENCOUNTER — Encounter (HOSPITAL_COMMUNITY): Payer: Self-pay | Admitting: Emergency Medicine

## 2015-12-30 ENCOUNTER — Emergency Department (HOSPITAL_COMMUNITY)
Admission: EM | Admit: 2015-12-30 | Discharge: 2015-12-31 | Disposition: A | Discharge status: Home / Self Care | Payer: BLUE CROSS/BLUE SHIELD | Attending: Emergency Medicine | Admitting: Emergency Medicine

## 2015-12-30 ENCOUNTER — Emergency Department (HOSPITAL_COMMUNITY): Payer: BLUE CROSS/BLUE SHIELD

## 2015-12-30 DIAGNOSIS — F1110 Opioid abuse, uncomplicated: Secondary | ICD-10-CM | POA: Insufficient documentation

## 2015-12-30 DIAGNOSIS — R569 Unspecified convulsions: Secondary | ICD-10-CM | POA: Insufficient documentation

## 2015-12-30 DIAGNOSIS — F1910 Other psychoactive substance abuse, uncomplicated: Secondary | ICD-10-CM | POA: Insufficient documentation

## 2015-12-30 DIAGNOSIS — Z7952 Long term (current) use of systemic steroids: Secondary | ICD-10-CM | POA: Insufficient documentation

## 2015-12-30 DIAGNOSIS — Z3202 Encounter for pregnancy test, result negative: Secondary | ICD-10-CM | POA: Insufficient documentation

## 2015-12-30 DIAGNOSIS — Z793 Long term (current) use of hormonal contraceptives: Secondary | ICD-10-CM | POA: Insufficient documentation

## 2015-12-30 DIAGNOSIS — Z8719 Personal history of other diseases of the digestive system: Secondary | ICD-10-CM | POA: Diagnosis not present

## 2015-12-30 DIAGNOSIS — F1210 Cannabis abuse, uncomplicated: Secondary | ICD-10-CM | POA: Insufficient documentation

## 2015-12-30 DIAGNOSIS — R6883 Chills (without fever): Secondary | ICD-10-CM | POA: Diagnosis not present

## 2015-12-30 DIAGNOSIS — J45909 Unspecified asthma, uncomplicated: Secondary | ICD-10-CM | POA: Diagnosis not present

## 2015-12-30 DIAGNOSIS — R1084 Generalized abdominal pain: Secondary | ICD-10-CM | POA: Diagnosis not present

## 2015-12-30 DIAGNOSIS — Z79899 Other long term (current) drug therapy: Secondary | ICD-10-CM | POA: Insufficient documentation

## 2015-12-30 DIAGNOSIS — Z87891 Personal history of nicotine dependence: Secondary | ICD-10-CM | POA: Insufficient documentation

## 2015-12-30 DIAGNOSIS — Z8619 Personal history of other infectious and parasitic diseases: Secondary | ICD-10-CM | POA: Diagnosis not present

## 2015-12-30 HISTORY — DX: Unspecified convulsions: R56.9

## 2015-12-30 HISTORY — DX: Irritable bowel syndrome, unspecified: K58.9

## 2015-12-30 LAB — I-STAT BETA HCG BLOOD, ED (MC, WL, AP ONLY): I-stat hCG, quantitative: <5 m[IU]/mL (ref <5)

## 2015-12-30 LAB — BASIC METABOLIC PANEL
Anion gap: 10 (ref 5–15)
BUN: <5 mg/dL — ABNORMAL LOW (ref 6–20)
CHLORIDE: 104 mmol/L (ref 101–111)
CO2: 25 mmol/L (ref 22–32)
CREATININE: 0.74 mg/dL (ref 0.44–1.00)
Calcium: 9.5 mg/dL (ref 8.9–10.3)
GFR calc non Af Amer: >60 mL/min (ref >60)
GLUCOSE: 94 mg/dL (ref 65–99)
Potassium: 4 mmol/L (ref 3.5–5.1)
Sodium: 139 mmol/L (ref 135–145)

## 2015-12-30 LAB — CBC WITH DIFFERENTIAL/PLATELET
BASOS ABS: 0 10*3/uL (ref 0.0–0.1)
Basophils Relative: 0 %
Eosinophils Absolute: 0.2 10*3/uL (ref 0.0–0.7)
Eosinophils Relative: 2 %
HEMATOCRIT: 39.4 % (ref 36.0–46.0)
Hemoglobin: 13.2 g/dL (ref 12.0–15.0)
LYMPHS PCT: 34 %
Lymphs Abs: 3.3 10*3/uL (ref 0.7–4.0)
MCH: 29.7 pg (ref 26.0–34.0)
MCHC: 33.5 g/dL (ref 30.0–36.0)
MCV: 88.5 fL (ref 78.0–100.0)
MONO ABS: 0.6 10*3/uL (ref 0.1–1.0)
Monocytes Relative: 7 %
NEUTROS ABS: 5.5 10*3/uL (ref 1.7–7.7)
Neutrophils Relative %: 57 %
Platelets: 335 10*3/uL (ref 150–400)
RBC: 4.45 MIL/uL (ref 3.87–5.11)
RDW: 12.8 % (ref 11.5–15.5)
WBC: 9.6 10*3/uL (ref 4.0–10.5)

## 2015-12-30 MED ORDER — SODIUM CHLORIDE 0.9 % IV BOLUS (SEPSIS)
2000.0000 mL | Freq: Once | INTRAVENOUS | Status: AC
  Administered 2015-12-30: 2000 mL via INTRAVENOUS

## 2015-12-30 MED ORDER — ONDANSETRON HCL 4 MG/2ML IJ SOLN
4.0000 mg | Freq: Once | INTRAMUSCULAR | Status: AC
  Administered 2015-12-30: 4 mg via INTRAVENOUS
  Filled 2015-12-30: qty 2

## 2015-12-30 MED ORDER — SODIUM CHLORIDE 0.9 % IV SOLN: INTRAVENOUS | Status: DC

## 2015-12-30 MED ORDER — ONDANSETRON HCL 4 MG/2ML IJ SOLN: 4.0000 mg | Freq: Once | INTRAMUSCULAR | Status: AC

## 2015-12-30 NOTE — ED Provider Notes (Signed)
CSN: [id number]     Arrival date & time 12/30/15  2153 History   First MD Initiated Contact with Patient 12/30/15 2228     Chief Complaint  Patient presents with  . Seizures     (Consider location/radiation/quality/duration/timing/severity/associated sxs/prior Treatment) HPI  Dana Lozano is a 34 y.o. female who presents for evaluation of seizure, chills, abdominal pain, concern for PICC catheter infection, bladder spasm, dysuria, and tender nodules on her abdominal wall. Tonight she was with her father at home, lying on steps and began shaking. At this time, she was unconscious. She stayed unconscious and had intermittent shaking for 40 minutes time. During this time, EMS was summoned and found her "postictal". She was transferred here, and after arrival, she awoke. She did not fall down the steps. She typically has "a few" seizures every month. She has been taking her usual antiepileptic medications. She states that her seizures have been caused by chronic Lyme disease. She uses daily infusions of lactated Ringer's, and frequent takes IV Zofran, and home for symptoms of chronic nausea and dehydration. She has a PICC line, primarily for infusions of antibiotics, but this time, that was done, was 4 months ago. She's not had a recent urinary tract infections. She feels like she is having "bladder spasms" at this time. She is taking her usual medications. There are no other known modifying factors.    Past Medical History  Diagnosis Date  . Lyme disease   . Seizures (HCC)   . Gastroparesis   . Interstitial cystitis   . Irritable bowel    Past Surgical History  Procedure Laterality Date  . Nasal sinus surgery    . Exploratory laparotomy    . Bladder stretch     History reviewed. No pertinent family history. Social History  Substance Use Topics  . Smoking status: Former Games developer  . Smokeless tobacco: None  . Alcohol Use: No   OB History    No data available     Review of Systems   All other systems reviewed and are negative.     Allergies  Ibuprofen; Reglan; and Septra  Home Medications   Prior to Admission medications   Medication Sig Start Date End Date Taking? Authorizing Provider  buPROPion (WELLBUTRIN XL) 300 MG 24 hr tablet Take 300 mg by mouth daily.   Yes Historical Provider, MD  clonazePAM (KLONOPIN) 1 MG tablet Take 2 mg by mouth at bedtime.   Yes Historical Provider, MD  colesevelam (WELCHOL) 625 MG tablet Take 625 mg by mouth daily as needed (for IC).   Yes Historical Provider, MD  diazepam (VALIUM) 5 MG tablet Take 5-10 mg by mouth every 12 (twelve) hours as needed for anxiety or muscle spasms (for seizures).   Yes Historical Provider, MD  dicyclomine (BENTYL) 10 MG capsule Take 10 mg by mouth daily as needed for spasms.   Yes Historical Provider, MD  diphenoxylate-atropine (LOMOTIL) 2.5-0.025 MG tablet Take 1 tablet by mouth 4 (four) times daily as needed for diarrhea or loose stools.   Yes Historical Provider, MD  dronabinol (MARINOL) 10 MG capsule Take 20 mg by mouth daily.   Yes Historical Provider, MD  esomeprazole (NEXIUM) 20 MG capsule Take 20 mg by mouth 2 (two) times daily before a meal.   Yes Historical Provider, MD  HYDROcodone-acetaminophen (NORCO) 10-325 MG tablet Take 1 tablet by mouth every 6 (six) hours as needed for moderate pain.    Yes Historical Provider, MD  hydrocortisone (CORTEF) 5 MG tablet  Take 10 mg by mouth every morning.   Yes Historical Provider, MD  hydrOXYzine (ATARAX/VISTARIL) 25 MG tablet Take 25 mg by mouth daily.   Yes Historical Provider, MD  L-Methylfolate (DEPLIN) 15 MG TABS Take 15 mg by mouth every evening.   Yes Historical Provider, MD  lactated ringers Inject 1,000 mLs into the vein daily.   Yes Historical Provider, MD  lamoTRIgine (LAMICTAL) 100 MG tablet Take 200 mg by mouth 2 (two) times daily.   Yes Historical Provider, MD  levonorgestrel-ethinyl estradiol (SEASONALE,INTROVALE,JOLESSA) 0.15-0.03 MG tablet  Take 1 tablet by mouth daily.   Yes Historical Provider, MD  loratadine (CLARITIN) 10 MG tablet Take 10 mg by mouth every evening.   Yes Historical Provider, MD  ondansetron (ZOFRAN) 40 MG/20ML SOLN injection Inject 4 mg into the vein daily as needed for nausea or vomiting.   Yes Historical Provider, MD  OXcarbazepine (TRILEPTAL) 150 MG tablet Take 150 mg by mouth 2 (two) times daily.   Yes Historical Provider, MD  pentosan polysulfate (ELMIRON) 100 MG capsule Take 100-200 mg by mouth 2 (two) times daily. TAKES 1 IN AM AND 2 IN PM   Yes Historical Provider, MD  pregabalin (LYRICA) 25 MG capsule Take 25 mg by mouth at bedtime.   Yes Historical Provider, MD  QUEtiapine (SEROQUEL) 25 MG tablet Take 25 mg by mouth at bedtime.   Yes Historical Provider, MD  venlafaxine XR (EFFEXOR-XR) 75 MG 24 hr capsule Take 225 mg by mouth daily with breakfast.   Yes Historical Provider, MD   BP 129/88 mmHg  Pulse 79  Temp(Src) 99.6 F (37.6 C) (Rectal)  Resp 20  Ht  (1.727 m)  Wt 175 lb (79.379 kg)  BMI 26.61 kg/m2  SpO2 100%  LMP 06/15/2015 (Within Months) Physical Exam  Constitutional: She is oriented to person, place, and time. She appears well-developed and well-nourished.  HENT:  Head: Normocephalic and atraumatic.  Right Ear: External ear normal.  Left Ear: External ear normal.  Eyes: Conjunctivae and EOM are normal. Pupils are equal, round, and reactive to light.  Neck: Normal range of motion and phonation normal. Neck supple.  Cardiovascular: Normal rate, regular rhythm and normal heart sounds.   Pulmonary/Chest: Effort normal and breath sounds normal. She exhibits no bony tenderness.  Abdominal: Soft. She exhibits no mass. There is tenderness (diffuse, mild). There is no rebound and no guarding.  Musculoskeletal: Normal range of motion.  Neurological: She is alert and oriented to person, place, and time. No cranial nerve deficit or sensory deficit. She exhibits normal muscle tone.  Coordination normal.  Skin: Skin is warm, dry and intact.  Psychiatric: Her behavior is normal. Judgment and thought content normal.  Anxious  Nursing note and vitals reviewed.   ED Course  Procedures (including critical care time)  Medications  0.9 %  sodium chloride infusion (not administered)  ketorolac (TORADOL) 30 MG/ML injection 30 mg (not administered)  ondansetron (ZOFRAN) injection 4 mg (4 mg Intravenous Given 12/30/15 2356)  sodium chloride 0.9 % bolus 2,000 mL (2,000 mLs Intravenous New Bag/Given 12/30/15 2352)  ondansetron (ZOFRAN) injection 4 mg (0 mg Intravenous Duplicate 12/30/15 2359)    Patient Vitals for the past 24 hrs:  BP Temp Temp src Pulse Resp SpO2 Height Weight  12/31/15 0017 - 99.6 F (37.6 C) Rectal - - - - -  12/31/15 0004 129/88 mmHg - - 79 20 100 % - -  12/30/15 2300 126/90 mmHg - - 82 17 98 % - -  12/30/15 2230 134/94 mmHg - - 78 20 100 % - -  12/30/15 2210 (!) 122/101 mmHg 100.3 F (37.9 C) Rectal - 19 100 % 5\' 8"  (1.727 m) 175 lb (79.379 kg)    12:45 AM Reevaluation with update and discussion. After initial assessment and treatment, an updated evaluation reveals no additional complaints. Findings discussed with the patient, all questions answered. Fritzie Prioleau L    Labs Review Labs Reviewed  BASIC METABOLIC PANEL - Abnormal; Notable for the following:    BUN <5 (*)    All other components within normal limits  URINALYSIS, ROUTINE W REFLEX MICROSCOPIC (NOT AT [facility]) - Abnormal; Notable for the following:    APPearance CLOUDY (*)    Leukocytes, UA SMALL (*)    All other components within normal limits  URINE MICROSCOPIC-ADD ON - Abnormal; Notable for the following:    Squamous Epithelial / LPF 6-30 (*)    Bacteria, UA FEW (*)    All other components within normal limits  CBC WITH DIFFERENTIAL/PLATELET  URINE RAPID DRUG SCREEN, HOSP PERFORMED  I-STAT BETA HCG BLOOD, ED ([facility], [facility], [facility] ONLY)  CBG MONITORING, ED    Imaging Review Dg Chest  Portable 1 View  12/30/2015  CLINICAL DATA:  34 year old female with chest pain and seizures EXAM: PORTABLE CHEST 1 VIEW COMPARISON:  None. FINDINGS: Right-sided central line with tip over central SVC. Single-view of the chest demonstrates clear lungs. No focal consolidation, pleural effusion, or pneumothorax. Cardiac silhouette is within normal limits. The osseous structures appear unremarkable. IMPRESSION: No active disease. Electronically Signed   By: Elgie CollardArash  Radparvar M.D.   On: 12/30/2015 23:36   I have personally reviewed and evaluated these images and lab results as part of my medical decision-making.   EKG Interpretation   Date/Time:  Friday December 30 2015 22:06:51 EST Ventricular Rate:  96 PR Interval:  113 QRS Duration: 101 QT Interval:  351 QTC Calculation: 443 R Axis:   86 Text Interpretation:  Sinus rhythm Borderline short PR interval Abnormal  inferior Q waves No old tracing to compare Confirmed by [facility]  MD, Valinda Fedie  [phone]([id number]) on 12/30/2015 11:16:50 PM      MDM   Final diagnoses:  Seizure (HCC)  Generalized abdominal pain    Patient with numerous complaints, without clear unifying diagnosis. Possible prolonged seizure today versus syncopal episode. She has chronic seizure disorder, and is currently under treatment. No injuries, today. No evidence for line infection. Doubt pneumonia. Doubt metabolic instability or impending vascular collapse.  Nursing Notes Reviewed/ Care Coordinated Applicable Imaging Reviewed Interpretation of Laboratory Data incorporated into ED treatment  The patient appears reasonably screened and/or stabilized for discharge and I doubt any other medical condition or other [facility] requiring further screening, evaluation, or treatment in the ED at this time prior to discharge.  Plan: Home Medications- usual; Home Treatments- rest; return here if the recommended treatment, does not improve the symptoms; Recommended follow up- PCP and Urology  asap   Mancel BaleElliott Cristopher Ciccarelli, MD 12/31/15 [phone]

## 2015-12-30 NOTE — ED Notes (Signed)
Per EMS, pt had a seizure, and was found at the bottom of the stairs, but pt's father denies a fall. Pt is postictal for EMS, but responds to voice. Pt's father told EMS that he doesn't think that the pt takes any seizure medications, but he normally gives her valium for her seizures.   Pt is alert at this time, and reports that he PICC line hurts at the insicion site and up into her neck, and that she has had pain in her RLQ radiating toward her back. Pt also reports chills and sweating, as well as SOB.

## 2015-12-31 LAB — URINALYSIS, ROUTINE W REFLEX MICROSCOPIC
BILIRUBIN URINE: NEGATIVE
Glucose, UA: NEGATIVE mg/dL
Hgb urine dipstick: NEGATIVE
Ketones, ur: NEGATIVE mg/dL
NITRITE: NEGATIVE
Protein, ur: NEGATIVE mg/dL
Specific Gravity, Urine: 1.015 (ref 1.005–1.030)
pH: 7.5 (ref 5.0–8.0)

## 2015-12-31 LAB — RAPID URINE DRUG SCREEN, HOSP PERFORMED
AMPHETAMINES: NOT DETECTED
Barbiturates: NOT DETECTED
Benzodiazepines: POSITIVE — AB
Cocaine: NOT DETECTED
Opiates: POSITIVE — AB
TETRAHYDROCANNABINOL: POSITIVE — AB

## 2015-12-31 LAB — URINE MICROSCOPIC-ADD ON: RBC / HPF: NONE SEEN RBC/hpf (ref 0–5)

## 2015-12-31 MED ORDER — AMMONIA AROMATIC IN INHA
RESPIRATORY_TRACT | Status: AC
  Administered 2015-12-31: 02:00:00
  Filled 2015-12-31: qty 10

## 2015-12-31 MED ORDER — SODIUM CHLORIDE 0.9 % IV BOLUS (SEPSIS)
500.0000 mL | Freq: Once | INTRAVENOUS | Status: AC
  Administered 2015-12-31: 500 mL via INTRAVENOUS

## 2015-12-31 MED ORDER — KETOROLAC TROMETHAMINE 30 MG/ML IJ SOLN
30.0000 mg | Freq: Once | INTRAMUSCULAR | Status: AC
  Administered 2015-12-31: 30 mg via INTRAVENOUS
  Filled 2015-12-31: qty 1

## 2015-12-31 MED ORDER — HYDROMORPHONE HCL 1 MG/ML IJ SOLN
0.5000 mg | Freq: Once | INTRAMUSCULAR | Status: AC
  Administered 2015-12-31: 0.5 mg via INTRAVENOUS
  Filled 2015-12-31: qty 1

## 2015-12-31 NOTE — Discharge Instructions (Signed)
Abdominal Pain, Adult Many things can cause abdominal pain. Usually, abdominal pain is not caused by a disease and will improve without treatment. It can often be observed and treated at home. Your health care provider will do a physical exam and possibly order blood tests and X-rays to help determine the seriousness of your pain. However, in many cases, more time must pass before a clear cause of the pain can be found. Before that point, your health care provider may not know if you need more testing or further treatment. HOME CARE INSTRUCTIONS Monitor your abdominal pain for any changes. The following actions may help to alleviate any discomfort you are experiencing:  Only take over-the-counter or prescription medicines as directed by your health care provider.  Do not take laxatives unless directed to do so by your health care provider.  Try a clear liquid diet (broth, tea, or water) as directed by your health care provider. Slowly move to a bland diet as tolerated. SEEK MEDICAL CARE IF:  You have unexplained abdominal pain.  You have abdominal pain associated with nausea or diarrhea.  You have pain when you urinate or have a bowel movement.  You experience abdominal pain that wakes you in the night.  You have abdominal pain that is worsened or improved by eating food.  You have abdominal pain that is worsened with eating fatty foods.  You have a fever. SEEK IMMEDIATE MEDICAL CARE IF:  Your pain does not go away within 2 hours.  You keep throwing up (vomiting).  Your pain is felt only in portions of the abdomen, such as the right side or the left lower portion of the abdomen.  You pass bloody or black tarry stools. MAKE SURE YOU:  Understand these instructions.  Will watch your condition.  Will get help right away if you are not doing well or get worse.   This information is not intended to replace advice given to you by your health care provider. Make sure you discuss  any questions you have with your health care provider.   Document Released: 09/26/2005 Document Revised: 09/07/2015 Document Reviewed: 08/26/2013 Elsevier Interactive Patient Education [employer]  Seizure, Adult A seizure means there is unusual activity in the brain. A seizure can cause changes in attention or behavior. Seizures often cause shaking (convulsions). Seizures often last from 30 seconds to 2 minutes. HOME CARE   If you are given medicines, take them exactly as told by your doctor.  Keep all doctor visits as told.  Do not swim or drive until your doctor says it is okay.  Teach others what to do if you have a seizure. They should:  Lay you on the ground.  Put a cushion under your head.  Loosen any tight clothing around your neck.  Turn you on your side.  Stay with you until you get better. GET HELP RIGHT AWAY IF:   The seizure lasts longer than 2 to 5 minutes.  The seizure is very bad.  The person does not wake up after the seizure.  The person's attention or behavior changes. Drive the person to the emergency room or call your local emergency services (911 in [place] MAKE SURE YOU:   Understand these instructions.  Will watch your condition.  Will get help right away if you are not doing well or get worse.   This information is not intended to replace advice given to you by your health care provider. Make sure you discuss any questions you  have with your health care provider.   Document Released: 06/04/2008 Document Revised: 03/10/2012 Document Reviewed: 07/29/2013 Elsevier Interactive Patient Education [employer] Elsevier Inc.

## 2015-12-31 NOTE — ED Notes (Signed)
Zavitz, MD at bedside 

## 2015-12-31 NOTE — ED Notes (Signed)
Jodi MourningZavitz, MD notified of pt's seizure-like episode.

## 2015-12-31 NOTE — ED Notes (Signed)
This RN wasted .5mg  dilaudid, witnessed by Johny DrillingKaren Collins, RN

## 2015-12-31 NOTE — ED Notes (Signed)
Pt is having seizure-like activity. Pt is trembling, and will intermittently jerk her arms. Pt breathing during the duration of the episode, eyes moving rapidly from side to side under her eyelids, and moves her head away from an ammonia inhalant. Pt was grimacing, and moving her lips during the entire episode. When pt stopped trembling, she refused to open her eyes, and would only mumble "light".

## 2015-12-31 NOTE — ED Notes (Signed)
Pt is able to stand with good balance.

## 2016-01-01 LAB — URINE CULTURE: Special Requests: NORMAL

## 2016-01-02 ENCOUNTER — Encounter (HOSPITAL_COMMUNITY): Payer: Self-pay | Admitting: Emergency Medicine

## 2016-01-29 ENCOUNTER — Ambulatory Visit (INDEPENDENT_AMBULATORY_CARE_PROVIDER_SITE_OTHER): Payer: BLUE CROSS/BLUE SHIELD

## 2016-01-29 ENCOUNTER — Ambulatory Visit (INDEPENDENT_AMBULATORY_CARE_PROVIDER_SITE_OTHER): Payer: BLUE CROSS/BLUE SHIELD | Admitting: Family Medicine

## 2016-01-29 VITALS — BP 142/92 | HR 143 | Temp 98.5°F | Resp 18 | Ht 68.0 in

## 2016-01-29 DIAGNOSIS — R0602 Shortness of breath: Secondary | ICD-10-CM

## 2016-01-29 DIAGNOSIS — A6920 Lyme disease, unspecified: Secondary | ICD-10-CM | POA: Diagnosis not present

## 2016-01-29 DIAGNOSIS — E860 Dehydration: Secondary | ICD-10-CM

## 2016-01-29 DIAGNOSIS — E2740 Unspecified adrenocortical insufficiency: Secondary | ICD-10-CM

## 2016-01-29 DIAGNOSIS — R509 Fever, unspecified: Secondary | ICD-10-CM | POA: Diagnosis not present

## 2016-01-29 DIAGNOSIS — A09 Infectious gastroenteritis and colitis, unspecified: Secondary | ICD-10-CM

## 2016-01-29 DIAGNOSIS — Z95828 Presence of other vascular implants and grafts: Secondary | ICD-10-CM | POA: Diagnosis not present

## 2016-01-29 DIAGNOSIS — R000 Tachycardia, unspecified: Secondary | ICD-10-CM | POA: Diagnosis not present

## 2016-01-29 DIAGNOSIS — R110 Nausea: Secondary | ICD-10-CM

## 2016-01-29 DIAGNOSIS — R197 Diarrhea, unspecified: Secondary | ICD-10-CM

## 2016-01-29 LAB — POCT CBC
Granulocyte percent: 52.1 %G (ref 37–80)
HEMATOCRIT: 38.2 % (ref 37.7–47.9)
HEMOGLOBIN: 12.8 g/dL (ref 12.2–16.2)
Lymph, poc: 4.1 — AB (ref 0.6–3.4)
MCH: 28.9 pg (ref 27–31.2)
MCHC: 33.4 g/dL (ref 31.8–35.4)
MCV: 86.5 fL (ref 80–97)
MID (CBC): 0.5 (ref 0–0.9)
MPV: 7.7 fL (ref 0–99.8)
POC GRANULOCYTE: 4.9 (ref 2–6.9)
POC LYMPH PERCENT: 43 %L (ref 10–50)
POC MID %: 4.9 % (ref 0–12)
Platelet Count, POC: 375 10*3/uL (ref 142–424)
RBC: 4.42 M/uL (ref 4.04–5.48)
RDW, POC: 13.3 %
WBC: 9.5 10*3/uL (ref 4.6–10.2)

## 2016-01-29 LAB — POC MICROSCOPIC URINALYSIS (UMFC): MUCUS RE: ABSENT

## 2016-01-29 LAB — POCT URINALYSIS DIP (MANUAL ENTRY)
BILIRUBIN UA: NEGATIVE
GLUCOSE UA: NEGATIVE
Nitrite, UA: NEGATIVE
Protein Ur, POC: 100 — AB
RBC UA: NEGATIVE
SPEC GRAV UA: 1.015
Urobilinogen, UA: 0.2
pH, UA: 8

## 2016-01-29 LAB — POCT INFLUENZA A/B
INFLUENZA A, POC: NEGATIVE
Influenza B, POC: NEGATIVE

## 2016-01-29 LAB — GLUCOSE, POCT (MANUAL RESULT ENTRY): POC Glucose: 99 mg/dl (ref 70–99)

## 2016-01-29 NOTE — Progress Notes (Signed)
Subjective:    Patient ID: Dana Lozano, female    DOB: 11-19-1981, 35 y.o.   MRN: [id number]  01/29/2016  Shortness of Breath; Nausea; Diarrhea; and Dizziness   HPI This 35 y.o. female presents with father for evaluation of fatigue, port pain for the past two weeks, fevers intermittent.  Temperature has been ranging 97.3 to 100.3, shortness of breath, dizziness, decreased mental awareness, hypersomnolence.  Onset two weeks ago.  Had a seizure on 12/30/15 and was having pain at that time.  Had a temperature in ED but low grade.  Tried to come yesterday but closed.  Refused to go to ED.  Has not had a good experience with EDs.   Watery diarrhea x 1 day.   +HA.  +ear pain L with drainage from the ear; ?wax?  No sore throat; lymph nodes in neck have swollen intermittent two weeks.  No rhinorrhea or nasal congestion.  No cough.  No vomiting today; chronic nausea; last vomiting three days ago.  Diarrhea; known IBS; chronic interstitial cystitis.  Diarrhea x 4 watery; unusual; no solid food today.  Last solid food three days ago.  No bloody stools.  Urinated once today at 2:30pm.  +dysuria chronic with IC; no hematuria; no frequency; decreased UOP.   GI issues ongoing for years with recent worsening two years.  Major problems last year.  Has fallen and took a tumble; pain in port started before seizure.  Port is functioning fine at this point.    Diagnosed Lyme's disease age 53; relapsed in 2001-2002; 2007-now relapse.  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    Travels to [place] for treatment of Lyme's disease Jemsek Joseph/ID; month sixteenth with port; treatment for one year.  No iv abx.  Scheduled for EGD next week.  Has gastroparesis.  Has kept line in because of GI issues; cannot keep down liquids; severe nausea.  Running 1 liter qod.   RN comes to home weekly to change bandage on line.   PCP:  None; previous [facility] [place].  Still PCP yet no recent visits. Cook: [facility]  Urologist:  Andrey Spearman [facility]. Psychiatry: Evelene Croon Endocrinology: none ID/Lyme's disease: Jemsek Neurology: none  Adrenal insufficiency: managed by ID/Lyme's; maintained on hydrocortisone daily. No protocol with increasing hydrocortisone dose with acute illness.   Seizure disorder: managed by Lyme's physician.  Onset of seizures with place in neck; s/p resection by Dr. Dutch Quint.  Two seizures per year.  Psychiatrist: Dr. Evelene Croon.     Review of Systems  Constitutional: Positive for fever, chills, diaphoresis and fatigue.  HENT: Positive for ear pain. Negative for congestion, drooling, postnasal drip, rhinorrhea, sinus pressure, sore throat, trouble swallowing and voice change.   Respiratory: Positive for shortness of breath. Negative for cough.   Cardiovascular: Positive for palpitations. Negative for chest pain and leg swelling.  Gastrointestinal: Positive for nausea, vomiting and diarrhea. Negative for abdominal pain, constipation, abdominal distention and rectal pain.  Genitourinary: Positive for dysuria. Negative for urgency, frequency, hematuria, flank pain, vaginal bleeding, vaginal discharge, genital sores and vaginal pain.  Skin: Negative for rash.  Neurological: Positive for dizziness, light-headedness and headaches.  Hematological: Positive for adenopathy.    Past Medical History  Diagnosis Date  . Bile salt-induced diarrhea   . GERD (gastroesophageal reflux disease)   . Bile reflux esophagitis   . Fibromyalgia   . Anemia, iron deficiency 02/05/2013  . Seizure (HCC)   . IBS (irritable bowel syndrome)   . Chronic abdominal  pain   . Migraine headache   . Asthma   . GERD (gastroesophageal reflux disease)   . Nausea vomiting and diarrhea     recurrent  . Kidney stones   . Lyme disease   . Seizures (HCC)   . Gastroparesis   . Interstitial cystitis   . Irritable bowel    Past Surgical History  Procedure Laterality Date  . Cholecystectomy    . Port-a-cath removal    . Sinusotomy    .  Nasal sinus surgery    . Exploratory laparotomy    . Bladder stretch     Allergies  Allergen Reactions  . Ibuprofen Anaphylaxis  . Reglan [Metoclopramide] Other (See Comments)    Nerve pain  . Reglan [Metoclopramide] Other (See Comments)    Severe body pain   . Septra [Sulfamethoxazole-Trimethoprim] Other (See Comments)    Mouth sores/swelling  . Doxycycline Nausea And Vomiting  . Ibuprofen Swelling  . Lactose Intolerance (Gi)     Stomach cramps  . Septra [Sulfamethoxazole-Trimethoprim] Other (See Comments)    Severe mouth ulcers, possibly caused seizures   Current Outpatient Prescriptions  Medication Sig Dispense Refill  . buPROPion (WELLBUTRIN XL) 300 MG 24 hr tablet Take 300 mg by mouth daily.    . clonazePAM (KLONOPIN) 1 MG tablet Take 2 mg by mouth every evening.     . colesevelam (WELCHOL) 625 MG tablet Take 625 mg by mouth daily as needed (for IC).    Marland Kitchen diazepam (VALIUM) 5 MG tablet Take 5-10 mg by mouth every 12 (twelve) hours as needed for anxiety or muscle spasms (for seizures).    Marland Kitchen dicyclomine (BENTYL) 10 MG capsule Take 10 mg by mouth daily as needed for spasms.    . diphenoxylate-atropine (LOMOTIL) 2.5-0.025 MG per tablet Take 2 tablets by mouth 4 (four) times daily as needed (IBS).     Marland Kitchen dronabinol (MARINOL) 10 MG capsule Take 10 mg by mouth 3 (three) times daily as needed (nausea/bone pain).     Marland Kitchen esomeprazole (NEXIUM) 20 MG capsule Take 40 mg by mouth 2 (two) times daily before a meal.     . HYDROcodone-acetaminophen (NORCO) 10-325 MG tablet Take 1 tablet by mouth every 6 (six) hours as needed for moderate pain.     . hydrocortisone (CORTEF) 5 MG tablet Take 5 mg by mouth daily.    . hydrOXYzine (ATARAX/VISTARIL) 25 MG tablet Take 25 mg by mouth daily.    Marland Kitchen L-Methylfolate (DEPLIN) 15 MG TABS Take 15 mg by mouth every evening.     . lactated ringers Inject 1,000 mLs into the vein daily.    Marland Kitchen lamoTRIgine (LAMICTAL) 100 MG tablet Take 200 mg by mouth 2 (two) times  daily.    Marland Kitchen levonorgestrel-ethinyl estradiol (SEASONALE) 0.15-0.03 MG tablet Take 1 tablet by mouth every evening.     . loratadine (CLARITIN) 10 MG tablet Take 10 mg by mouth every evening.    . ondansetron (ZOFRAN) 40 MG/20ML SOLN injection Inject 4 mg into the vein daily as needed for nausea or vomiting.    . OXcarbazepine (TRILEPTAL) 150 MG tablet Take 300 mg by mouth 2 (two) times daily.     . pentosan polysulfate (ELMIRON) 100 MG capsule Take 100 mg by mouth 3 (three) times daily.     . pregabalin (LYRICA) 25 MG capsule Take 25 mg by mouth at bedtime.    Marland Kitchen PRESCRIPTION MEDICATION See admin instructions. IV Zofran: sent to patient from [employer] [facility]:  [id number], [place], [place] [address] [phone]    . Probiotic Product (PROBIOTIC DAILY PO) Take 1 capsule by mouth as needed (when any meals are consumed).     . promethazine (PHENERGAN) 25 MG suppository Place 1 suppository (25 mg total) rectally every 6 (six) hours as needed for nausea or vomiting. 6 each 0  . venlafaxine XR (EFFEXOR-XR) 75 MG 24 hr capsule Take 225 mg by mouth every morning.     . colesevelam (WELCHOL) 625 MG tablet Take 625 mg by mouth 2 (two) times daily as needed (cholesterol).     . diphenoxylate-atropine (LOMOTIL) 2.5-0.025 MG tablet Take 1 tablet by mouth 4 (four) times daily as needed for diarrhea or loose stools.     No current facility-administered medications for this visit.   Social History   Social History  . Marital Status: Single    Spouse Name: n/a  . Number of Children: 0  . Years of Education: college   Occupational History  . unemployed     denied disability x 2   Social History Main Topics  . Smoking status: Former Smoker    Quit date: 01/16/2003  . Smokeless tobacco: Not on file  . Alcohol Use: No  . Drug Use: No  . Sexual Activity: Not Currently   Other Topics Concern  . Not on file   Social History Narrative   ** Merged History Encounter **       Unable to  work due to the neurologic effects of Lyme Disease. Lives alone. Her parents live in [place]. Her parents support her financially.   Family History  Problem Relation Age of Onset  . Hypertension Father   . Mental illness Father     depression       Objective:    BP 142/92 mmHg  Pulse 143  Temp(Src) 98.5 F (36.9 C) (Oral)  Resp 18  Ht 5\' 8"  (1.727 m)  SpO2 98%  LMP 06/15/2015 Physical Exam  Constitutional: She is oriented to person, place, and time. She appears well-developed and well-nourished. She appears ill. No distress.  Poorly groomed.  HENT:  Head: Normocephalic and atraumatic.  Right Ear: External ear normal.  Left Ear: External ear normal.  Nose: Nose normal.  Mouth/Throat: Oropharynx is clear and moist.  Eyes: Conjunctivae and EOM are normal. Pupils are equal, round, and reactive to light.  Neck: Normal range of motion. Neck supple. Carotid bruit is not present. No thyromegaly present.  Cardiovascular: Regular rhythm, normal heart sounds and intact distal pulses.  Tachycardia present.  Exam reveals no gallop and no friction rub.   No murmur heard. Rate 105.  Pulmonary/Chest: Effort normal and breath sounds normal. She has no wheezes. She has no rales.  Abdominal: Soft. Bowel sounds are normal. She exhibits no distension and no mass. There is no tenderness. There is no rebound and no guarding.  Lymphadenopathy:    She has no cervical adenopathy.  Neurological: She is alert and oriented to person, place, and time. No cranial nerve deficit.  Skin: Skin is warm and dry. No rash noted. She is not diaphoretic. No erythema. No pallor.  Port-a-cath line present in R upper chest; no surrounding tenderness or erythema; mild TTP superior to line insertion without erythema.  No streaking.  Scattered scars along abdomen, extremities x 4.  Psychiatric: She has a normal mood and affect. Her behavior is normal.   Results for orders placed or performed in visit on 01/29/16  POCT CBC  Result Value Ref Range   WBC 9.5 4.6 - 10.2 K/uL   Lymph, poc 4.1 (A) 0.6 - 3.4   POC LYMPH PERCENT 43.0 10 - 50 %L   MID (cbc) 0.5 0 - 0.9   POC MID % 4.9 0 - 12 %M   POC Granulocyte 4.9 2 - 6.9   Granulocyte percent 52.1 37 - 80 %G   RBC 4.42 4.04 - 5.48 M/uL   Hemoglobin 12.8 12.2 - 16.2 g/dL   HCT, POC 40.9 81.1 - 47.9 %   MCV 86.5 80 - 97 fL   MCH, POC 28.9 27 - 31.2 pg   MCHC 33.4 31.8 - 35.4 g/dL   RDW, POC 91.4 %   Platelet Count, POC 375 142 - 424 K/uL   MPV 7.7 0 - 99.8 fL  POCT glucose (manual entry)  Result Value Ref Range   POC Glucose 99 70 - 99 mg/dl  POCT urinalysis dipstick  Result Value Ref Range   Color, UA yellow yellow   Clarity, UA cloudy (A) clear   Glucose, UA negative negative   Bilirubin, UA negative negative   Ketones, POC UA trace (5) (A) negative   Spec Grav, UA 1.015    Blood, UA negative negative   pH, UA 8.0    Protein Ur, POC =100 (A) negative   Urobilinogen, UA 0.2    Nitrite, UA Negative Negative   Leukocytes, UA small (1+) (A) Negative  POCT Microscopic Urinalysis ([facility])  Result Value Ref Range   WBC,UR,HPF,POC Few (A) None WBC/hpf   RBC,UR,HPF,POC None None RBC/hpf   Bacteria None None, Too numerous to count   Mucus Absent Absent   Epithelial Cells, UR Per Microscopy Few (A) None, Too numerous to count cells/hpf  POCT Influenza A/B  Result Value Ref Range   Influenza A, POC Negative Negative   Influenza B, POC Negative Negative   Orthostatic VS for the past 24 hrs:  BP- Lying Pulse- Lying BP- Sitting Pulse- Sitting BP- Standing at 0 minutes Pulse- Standing at 0 minutes  01/29/16 1631 126/81 mmHg 92 122/88 mmHg 93 120/87 mmHg 105    EKG: tachy at 113.  No ST changes. No results found.     Assessment & Plan:   1. Fever, unspecified   2. Tachycardia   3. Shortness of breath   4. Diarrhea of presumed infectious origin   5. Nausea without vomiting   6. Dehydration   7. Portacath in place   8. Lyme disease      1. Fever: New.  Low grade fevers reported for the past two weeks.  Onset of diarrhea yesterday.  Onset of port tenderness one month ago following seizure.  CBC reassuring.  Influenza swab negative. Send urine culture.  CXR negative.  Advised patient that cannot culture blood from port at our facility as we do not draw from ports.  Advised patient to present to ED for further evaluation and potential admission with port removal with tip sent for cx with blood cultures as well. Exam non-focal in office.  Port site without erythema.  No murmur on exam today. 2.  Tachycardia:  New.  Secondary to mild dehydration; advise to administer one liter of LR at home today.   3.  Shortness of breath: New.  Likely secondary to acute illness, tachycardia, dehydration.  CXR negative.  No associated chest pain.  4. Diarrhea: New; onset 24 hours ago.  BRAT diet. If persists, will warrant stool studies. No  abx in four months. 5.  Chronic nausea with vomiting: stable; followed by [facility]; scheduled for EGD this week; has maintained port for iv hydration at home.   6.  Port-a-cath placement: has been in place for sixteen months.  Tenderness at site; now with low grade fevers; recommend evaluation and removal with culture. 7.  Lyme's disease: chronic; followed by Dr. Abner Greenspan in [place]. S/p one year of iv abx therapy; no iv abx in four months.  Has also undergone treatment of Lyme's disease on multiple occasions in the past.   8. Adrenal insufficiency: advised patient to increase hydrocortisone dose by triple for three days; contact managing provider of acute illness and further recommendations.  Orders Placed This Encounter  Procedures  . Urine culture  . DG Chest 2 View    Standing Status: Future     Number of Occurrences: 1     Standing Expiration Date: 01/28/2017    Order Specific Question:  Reason for Exam (SYMPTOM  OR DIAGNOSIS REQUIRED)    Answer:  shortness of breath, fever, port-a-cath    Order  Specific Question:  Is the patient pregnant?    Answer:  No    Order Specific Question:  Preferred imaging location?    Answer:  External  . Comprehensive metabolic panel  . Orthostatic vital signs  . POCT CBC  . POCT glucose (manual entry)  . POCT urinalysis dipstick  . POCT Microscopic Urinalysis ([facility])  . POCT Influenza A/B  . EKG 12-Lead   No orders of the defined types were placed in this encounter.    No Follow-up on file.    Kymberly Blomberg Paulita Fujita, M.D. [facility] [address] [place], [place]  [address] [phone] phone [phone] fax

## 2016-01-29 NOTE — Patient Instructions (Addendum)
Because you received an x-ray today, you will receive an invoice from [facility]. Please contact [facility] at [phone] with questions or concerns regarding your invoice. Our billing staff will not be able to assist you with those questions.   1.  I HIGHLY RECOMMEND YOU CONTACT DR. JEMSEK/INFECTIOUS DISEASE TOMORROW TO DISCUSS PAIN AT YOUR PORT SITE. 2.  DUE TO YOU HAVING FEVERS FOR TWO WEEKS AND FEELS SO POORLY, I RECOMMEND EMERGENCY DEPARTMENT EVALUATION.

## 2016-01-30 LAB — COMPREHENSIVE METABOLIC PANEL WITH GFR
ALT: 23 U/L (ref 6–29)
AST: 17 U/L (ref 10–30)
Albumin: 4.3 g/dL (ref 3.6–5.1)
Alkaline Phosphatase: 89 U/L (ref 33–115)
BUN: 14 mg/dL (ref 7–25)
CO2: 25 mmol/L (ref 20–31)
Calcium: 9.7 mg/dL (ref 8.6–10.2)
Chloride: 104 mmol/L (ref 98–110)
Creat: 0.85 mg/dL (ref 0.50–1.10)
Glucose, Bld: 101 mg/dL — ABNORMAL HIGH (ref 65–99)
Potassium: 4.4 mmol/L (ref 3.5–5.3)
Sodium: 138 mmol/L (ref 135–146)
Total Bilirubin: 0.5 mg/dL (ref 0.2–1.2)
Total Protein: 7.1 g/dL (ref 6.1–8.1)

## 2016-01-31 ENCOUNTER — Encounter (HOSPITAL_COMMUNITY): Payer: Self-pay | Admitting: *Deleted

## 2016-01-31 ENCOUNTER — Emergency Department (HOSPITAL_COMMUNITY)
Admission: EM | Admit: 2016-01-31 | Discharge: 2016-02-01 | Disposition: A | Discharge status: Home / Self Care | Payer: BLUE CROSS/BLUE SHIELD | Attending: Emergency Medicine | Admitting: Emergency Medicine

## 2016-01-31 ENCOUNTER — Emergency Department (HOSPITAL_COMMUNITY): Payer: BLUE CROSS/BLUE SHIELD

## 2016-01-31 DIAGNOSIS — R300 Dysuria: Secondary | ICD-10-CM | POA: Insufficient documentation

## 2016-01-31 DIAGNOSIS — R110 Nausea: Secondary | ICD-10-CM | POA: Insufficient documentation

## 2016-01-31 DIAGNOSIS — Z79899 Other long term (current) drug therapy: Secondary | ICD-10-CM | POA: Insufficient documentation

## 2016-01-31 DIAGNOSIS — Z862 Personal history of diseases of the blood and blood-forming organs and certain disorders involving the immune mechanism: Secondary | ICD-10-CM | POA: Insufficient documentation

## 2016-01-31 DIAGNOSIS — J45909 Unspecified asthma, uncomplicated: Secondary | ICD-10-CM | POA: Insufficient documentation

## 2016-01-31 DIAGNOSIS — Z87891 Personal history of nicotine dependence: Secondary | ICD-10-CM | POA: Diagnosis not present

## 2016-01-31 DIAGNOSIS — R5383 Other fatigue: Secondary | ICD-10-CM | POA: Insufficient documentation

## 2016-01-31 DIAGNOSIS — R197 Diarrhea, unspecified: Secondary | ICD-10-CM | POA: Insufficient documentation

## 2016-01-31 DIAGNOSIS — K219 Gastro-esophageal reflux disease without esophagitis: Secondary | ICD-10-CM | POA: Diagnosis not present

## 2016-01-31 DIAGNOSIS — Z3202 Encounter for pregnancy test, result negative: Secondary | ICD-10-CM | POA: Insufficient documentation

## 2016-01-31 DIAGNOSIS — R1011 Right upper quadrant pain: Secondary | ICD-10-CM | POA: Insufficient documentation

## 2016-01-31 DIAGNOSIS — F419 Anxiety disorder, unspecified: Secondary | ICD-10-CM | POA: Insufficient documentation

## 2016-01-31 DIAGNOSIS — Z87442 Personal history of urinary calculi: Secondary | ICD-10-CM | POA: Diagnosis not present

## 2016-01-31 DIAGNOSIS — R109 Unspecified abdominal pain: Secondary | ICD-10-CM | POA: Diagnosis present

## 2016-01-31 DIAGNOSIS — G8929 Other chronic pain: Secondary | ICD-10-CM | POA: Insufficient documentation

## 2016-01-31 DIAGNOSIS — R42 Dizziness and giddiness: Secondary | ICD-10-CM | POA: Insufficient documentation

## 2016-01-31 LAB — URINALYSIS, ROUTINE W REFLEX MICROSCOPIC
Bilirubin Urine: NEGATIVE
GLUCOSE, UA: NEGATIVE mg/dL
Hgb urine dipstick: NEGATIVE
Ketones, ur: NEGATIVE mg/dL
NITRITE: NEGATIVE
PH: 6.5 (ref 5.0–8.0)
Protein, ur: NEGATIVE mg/dL
SPECIFIC GRAVITY, URINE: 1.019 (ref 1.005–1.030)

## 2016-01-31 LAB — LIPASE, BLOOD: LIPASE: 25 U/L (ref 11–51)

## 2016-01-31 LAB — URINE CULTURE: Colony Count: 50000

## 2016-01-31 LAB — COMPREHENSIVE METABOLIC PANEL
ALBUMIN: 3.9 g/dL (ref 3.5–5.0)
ALT: 26 U/L (ref 14–54)
ANION GAP: 11 (ref 5–15)
AST: 21 U/L (ref 15–41)
Alkaline Phosphatase: 91 U/L (ref 38–126)
BUN: 9 mg/dL (ref 6–20)
CHLORIDE: 106 mmol/L (ref 101–111)
CO2: 24 mmol/L (ref 22–32)
Calcium: 9.7 mg/dL (ref 8.9–10.3)
Creatinine, Ser: 0.83 mg/dL (ref 0.44–1.00)
GFR calc Af Amer: >60 mL/min (ref >60)
GFR calc non Af Amer: >60 mL/min (ref >60)
GLUCOSE: 91 mg/dL (ref 65–99)
POTASSIUM: 4.5 mmol/L (ref 3.5–5.1)
Sodium: 141 mmol/L (ref 135–145)
TOTAL PROTEIN: 7.3 g/dL (ref 6.5–8.1)

## 2016-01-31 LAB — CBC
HEMATOCRIT: 38.3 % (ref 36.0–46.0)
HEMOGLOBIN: 12.1 g/dL (ref 12.0–15.0)
MCH: 28.7 pg (ref 26.0–34.0)
MCHC: 31.6 g/dL (ref 30.0–36.0)
MCV: 90.8 fL (ref 78.0–100.0)
Platelets: 396 10*3/uL (ref 150–400)
RBC: 4.22 MIL/uL (ref 3.87–5.11)
RDW: 13 % (ref 11.5–15.5)
WBC: 7.4 10*3/uL (ref 4.0–10.5)

## 2016-01-31 LAB — URINE MICROSCOPIC-ADD ON

## 2016-01-31 LAB — I-STAT BETA HCG BLOOD, ED (MC, WL, AP ONLY): I-stat hCG, quantitative: <5 m[IU]/mL (ref <5)

## 2016-01-31 MED ORDER — ONDANSETRON HCL 4 MG/2ML IJ SOLN
4.0000 mg | Freq: Once | INTRAMUSCULAR | Status: AC
  Administered 2016-01-31: 4 mg via INTRAVENOUS

## 2016-01-31 MED ORDER — LORAZEPAM 2 MG/ML IJ SOLN
INTRAMUSCULAR | Status: AC
  Filled 2016-01-31: qty 1

## 2016-01-31 MED ORDER — SODIUM CHLORIDE 0.9 % IV SOLN
1000.0000 mg | Freq: Once | INTRAVENOUS | Status: AC
  Administered 2016-01-31: 1000 mg via INTRAVENOUS
  Filled 2016-01-31: qty 10

## 2016-01-31 MED ORDER — LORAZEPAM 2 MG/ML IJ SOLN
1.0000 mg | Freq: Once | INTRAMUSCULAR | Status: AC
  Administered 2016-01-31: 1 mg via INTRAVENOUS

## 2016-01-31 MED ORDER — IOHEXOL 300 MG/ML  SOLN
100.0000 mL | Freq: Once | INTRAMUSCULAR | Status: AC | PRN
  Administered 2016-01-31: 100 mL via INTRAVENOUS

## 2016-01-31 MED ORDER — MORPHINE SULFATE (PF) 4 MG/ML IV SOLN
4.0000 mg | Freq: Once | INTRAVENOUS | Status: AC
  Administered 2016-01-31: 4 mg via INTRAVENOUS
  Filled 2016-01-31: qty 1

## 2016-01-31 MED ORDER — LORAZEPAM 2 MG/ML IJ SOLN
0.5000 mg | Freq: Once | INTRAMUSCULAR | Status: AC
  Administered 2016-01-31: 0.5 mg via INTRAVENOUS

## 2016-01-31 MED ORDER — ONDANSETRON HCL 4 MG/2ML IJ SOLN
4.0000 mg | Freq: Once | INTRAMUSCULAR | Status: AC
  Administered 2016-01-31: 4 mg via INTRAVENOUS
  Filled 2016-01-31: qty 2

## 2016-01-31 MED ORDER — LACTATED RINGERS IV BOLUS (SEPSIS)
2000.0000 mL | Freq: Once | INTRAVENOUS | Status: AC
  Administered 2016-01-31: 2000 mL via INTRAVENOUS

## 2016-01-31 NOTE — ED Provider Notes (Signed)
CSN: [id number]     Arrival date & time 01/31/16  1519 History   First MD Initiated Contact with Patient 01/31/16 1738     Chief Complaint  Patient presents with  . Abdominal Pain  . Fatigue     (Consider location/radiation/quality/duration/timing/severity/associated sxs/prior Treatment) HPI Patient complains of diarrhea for several months and nausea for several months. She is not eaten anything today. She reports maximum temperature was 100.3, yesterday she's had no treatment with antiemetics or antipyretics. She complains of right-sided abdominal pain for the past 2 weeks, nothing makes symptoms better or worse. She was seen at [facility] urgent care 2 days ago and advised to come to the emergency department. She reports that her central line catheter site at right subclavian area has been itchy and red. Not painful. Nothing makes symptoms better or worse. No other associated symptoms. Patient suffers from chronic abdominal pain, chronic dysuria and chronic nausea, and chronic anorexia.. She reports to me that she routinely gets lactated Ringer's through chronic indwelling central line when it is felt that she is dehydrated. She states she may have lost "a couple pounds" since January 1 but she has not weighed herself. Past Medical History  Diagnosis Date  . Bile salt-induced diarrhea   . GERD (gastroesophageal reflux disease)   . Bile reflux esophagitis   . Fibromyalgia   . Anemia, iron deficiency 02/05/2013  . Seizure (HCC)   . IBS (irritable bowel syndrome)   . Chronic abdominal pain   . Migraine headache   . Asthma   . GERD (gastroesophageal reflux disease)   . Nausea vomiting and diarrhea     recurrent  . Kidney stones   . Lyme disease   . Seizures (HCC)   . Gastroparesis   . Interstitial cystitis   . Irritable bowel    Past Surgical History  Procedure Laterality Date  . Cholecystectomy    . Port-a-cath removal    . Sinusotomy    . Nasal sinus surgery    . Exploratory  laparotomy    . Bladder stretch     Family History  Problem Relation Age of Onset  . Hypertension Father   . Mental illness Father     depression   Social History  Substance Use Topics  . Smoking status: Former Smoker    Quit date: 01/16/2003  . Smokeless tobacco: None  . Alcohol Use: No   OB History    Gravida Para Term Preterm AB TAB SAB Ectopic Multiple Living   0 0 0 0 0 0 0 0       Review of Systems  Gastrointestinal: Positive for nausea, abdominal pain and diarrhea.  Genitourinary: Positive for dysuria.  Musculoskeletal: Positive for gait problem.       Walks with walker  Neurological: Positive for light-headedness.      Allergies  Ibuprofen; Reglan; Reglan; Septra; Doxycycline; Ibuprofen; Lactose intolerance (gi); and Septra  Home Medications   Prior to Admission medications   Medication Sig Start Date End Date Taking? Authorizing Provider  buPROPion (WELLBUTRIN XL) 300 MG 24 hr tablet Take 300 mg by mouth daily.    Historical Provider, MD  clonazePAM (KLONOPIN) 1 MG tablet Take 2 mg by mouth every evening.     Historical Provider, MD  colesevelam (WELCHOL) 625 MG tablet Take 625 mg by mouth 2 (two) times daily as needed (cholesterol).     Historical Provider, MD  colesevelam (WELCHOL) 625 MG tablet Take 625 mg by mouth daily as needed (for  IC).    Historical Provider, MD  diazepam (VALIUM) 5 MG tablet Take 5-10 mg by mouth every 12 (twelve) hours as needed for anxiety or muscle spasms (for seizures).    Historical Provider, MD  dicyclomine (BENTYL) 10 MG capsule Take 10 mg by mouth daily as needed for spasms.    Historical Provider, MD  diphenoxylate-atropine (LOMOTIL) 2.5-0.025 MG per tablet Take 2 tablets by mouth 4 (four) times daily as needed (IBS).     Historical Provider, MD  diphenoxylate-atropine (LOMOTIL) 2.5-0.025 MG tablet Take 1 tablet by mouth 4 (four) times daily as needed for diarrhea or loose stools.    Historical Provider, MD  dronabinol  (MARINOL) 10 MG capsule Take 10 mg by mouth 3 (three) times daily as needed (nausea/bone pain).     Historical Provider, MD  esomeprazole (NEXIUM) 20 MG capsule Take 40 mg by mouth 2 (two) times daily before a meal.     Historical Provider, MD  HYDROcodone-acetaminophen (NORCO) 10-325 MG tablet Take 1 tablet by mouth every 6 (six) hours as needed for moderate pain.     Historical Provider, MD  hydrocortisone (CORTEF) 5 MG tablet Take 5 mg by mouth daily.    Historical Provider, MD  hydrOXYzine (ATARAX/VISTARIL) 25 MG tablet Take 25 mg by mouth daily.    Historical Provider, MD  L-Methylfolate (DEPLIN) 15 MG TABS Take 15 mg by mouth every evening.     Historical Provider, MD  lactated ringers Inject 1,000 mLs into the vein daily.    Historical Provider, MD  lamoTRIgine (LAMICTAL) 100 MG tablet Take 200 mg by mouth 2 (two) times daily.    Historical Provider, MD  levonorgestrel-ethinyl estradiol (SEASONALE) 0.15-0.03 MG tablet Take 1 tablet by mouth every evening.     Historical Provider, MD  loratadine (CLARITIN) 10 MG tablet Take 10 mg by mouth every evening.    Historical Provider, MD  ondansetron (ZOFRAN) 40 MG/20ML SOLN injection Inject 4 mg into the vein daily as needed for nausea or vomiting.    Historical Provider, MD  OXcarbazepine (TRILEPTAL) 150 MG tablet Take 300 mg by mouth 2 (two) times daily.     Historical Provider, MD  pentosan polysulfate (ELMIRON) 100 MG capsule Take 100 mg by mouth 3 (three) times daily.     Historical Provider, MD  pregabalin (LYRICA) 25 MG capsule Take 25 mg by mouth at bedtime.    Historical Provider, MD  PRESCRIPTION MEDICATION See admin instructions. IV Zofran: sent to patient from [employer] [facility]:  [id number] [address], [place], [place] [address] [phone]    Historical Provider, MD  Probiotic Product (PROBIOTIC DAILY PO) Take 1 capsule by mouth as needed (when any meals are consumed).     Historical Provider, MD  promethazine (PHENERGAN) 25 MG  suppository Place 1 suppository (25 mg total) rectally every 6 (six) hours as needed for nausea or vomiting. 06/19/15   Ladona Mow, PA-C  venlafaxine XR (EFFEXOR-XR) 75 MG 24 hr capsule Take 225 mg by mouth every morning.     Historical Provider, MD   BP 129/84 mmHg  Pulse 104  Temp(Src) 98.6 F (37 C) (Oral)  Resp 20  SpO2 100%  LMP 06/15/2015 Physical Exam  Constitutional: No distress.  Chronically ill-appearing  HENT:  Head: Normocephalic and atraumatic.  Eyes: Conjunctivae are normal. Pupils are equal, round, and reactive to light.  Neck: Neck supple. No tracheal deviation present. No thyromegaly present.  Cardiovascular: Normal rate and regular rhythm.   No  murmur heard. Pulmonary/Chest: Effort normal and breath sounds normal.  Central venous catheter in place at right subclavian area site is not red warm or tender  Abdominal: Soft. Bowel sounds are normal. She exhibits no distension. There is tenderness.  Minimally tender at right upper quadrant no guarding rigidity or rebound  Musculoskeletal: Normal range of motion. She exhibits no edema or tenderness.  Neurological: She is alert. No cranial nerve deficit. Coordination normal.  Skin: Skin is warm and dry. No rash noted.  Psychiatric:  Mildly anxious appearing  Nursing note and vitals reviewed.   ED Course  Procedures (including critical care time) Labs Review Labs Reviewed  COMPREHENSIVE METABOLIC PANEL - Abnormal; Notable for the following:    Total Bilirubin <0.1 (*)    All other components within normal limits  LIPASE, BLOOD  CBC  URINALYSIS, ROUTINE W REFLEX MICROSCOPIC (NOT AT [facility])  I-STAT BETA HCG BLOOD, ED ([facility], [facility], [facility] ONLY)    Imaging Review No results found. I have personally reviewed and evaluated these images and lab results as part of my medical decision-making.   EKG Interpretation None     22:25 PM she had generalized seizure which lasted approximate 5 minutes. Treated with Ativan 1.5 mg stopped  spontaneously. At 12:40 AM patient is alert and Lipitor he feels much improved after treatment with intravenous fluids and Ativan, opioids, antiemetics, and she is able to drink ginger ale without vomiting. She feels ready to go home. She is alert and ambulates unassisted. Results for orders placed or performed during the hospital encounter of 01/31/16  Lipase, blood  Result Value Ref Range   Lipase 25 11 - 51 U/L  Comprehensive metabolic panel  Result Value Ref Range   Sodium 141 135 - 145 mmol/L   Potassium 4.5 3.5 - 5.1 mmol/L   Chloride 106 101 - 111 mmol/L   CO2 24 22 - 32 mmol/L   Glucose, Bld 91 65 - 99 mg/dL   BUN 9 6 - 20 mg/dL   Creatinine, Ser 0.34 0.44 - 1.00 mg/dL   Calcium 9.7 8.9 - 74.2 mg/dL   Total Protein 7.3 6.5 - 8.1 g/dL   Albumin 3.9 3.5 - 5.0 g/dL   AST 21 15 - 41 U/L   ALT 26 14 - 54 U/L   Alkaline Phosphatase 91 38 - 126 U/L   Total Bilirubin <0.1 (L) 0.3 - 1.2 mg/dL   GFR calc non Af Amer >60 >60 mL/min   GFR calc Af Amer >60 >60 mL/min   Anion gap 11 5 - 15  CBC  Result Value Ref Range   WBC 7.4 4.0 - 10.5 K/uL   RBC 4.22 3.87 - 5.11 MIL/uL   Hemoglobin 12.1 12.0 - 15.0 g/dL   HCT 59.5 63.8 - 75.6 %   MCV 90.8 78.0 - 100.0 fL   MCH 28.7 26.0 - 34.0 pg   MCHC 31.6 30.0 - 36.0 g/dL   RDW 43.3 29.5 - 18.8 %   Platelets 396 150 - 400 K/uL  Urinalysis, Routine w reflex microscopic (not at [facility])  Result Value Ref Range   Color, Urine YELLOW YELLOW   APPearance CLEAR CLEAR   Specific Gravity, Urine 1.019 1.005 - 1.030   pH 6.5 5.0 - 8.0   Glucose, UA NEGATIVE NEGATIVE mg/dL   Hgb urine dipstick NEGATIVE NEGATIVE   Bilirubin Urine NEGATIVE NEGATIVE   Ketones, ur NEGATIVE NEGATIVE mg/dL   Protein, ur NEGATIVE NEGATIVE mg/dL   Nitrite NEGATIVE NEGATIVE  Leukocytes, UA SMALL (A) NEGATIVE  Urine microscopic-add on  Result Value Ref Range   Squamous Epithelial / LPF 0-5 (A) NONE SEEN   WBC, UA 6-30 0 - 5 WBC/hpf   RBC / HPF 0-5 0 - 5 RBC/hpf    Bacteria, UA FEW (A) NONE SEEN  I-Stat beta hCG blood, ED ([facility], [facility], [facility] only)  Result Value Ref Range   I-stat hCG, quantitative <5.0 <5 mIU/mL   Comment 3           Dg Chest 2 View  01/29/2016  CLINICAL DATA:  Tachycardia.  Fever.  Shortness of breath. EXAM: CHEST  2 VIEW COMPARISON:  None. FINDINGS: Right-sided Port-A-Cath seen in appropriate position with tip overlying the distal SVC. No evidence of pneumothorax. The heart size and mediastinal contours are within normal limits. Both lungs are clear. The visualized skeletal structures are unremarkable. IMPRESSION: No active cardiopulmonary disease. Electronically Signed   By: Myles Rosenthal M.D.   On: 01/29/2016 18:05   Ct Abdomen Pelvis W Contrast  01/31/2016  CLINICAL DATA:  Fever headache and fatigue. EXAM: CT ABDOMEN AND PELVIS WITH CONTRAST TECHNIQUE: Multidetector CT imaging of the abdomen and pelvis was performed using the standard protocol following bolus administration of intravenous contrast. CONTRAST:  OMNIPAQUE IOHEXOL 300 MG/ML  SOLN COMPARISON:  02/04/2014 FINDINGS: Lower chest: There is no pleural or pericardial fluid. The lung bases are clear. Hepatobiliary: No suspicious liver abnormality. Focal area of low attenuation within the anterior right lobe of liver likely represents focal fatty deposition. The patient is status post cholecystectomy. There is no biliary dilatation. Pancreas: Negative Spleen: Negative Adrenals/Urinary Tract: Normal appearance of the adrenal glands. The kidneys are both on unremarkable. The urinary bladder is normal. No obstructive uropathy. Stomach/Bowel: The stomach is normal. The small bowel loops are normal in course and caliber. No bowel obstruction. The colon appears normal. No pathologic dilatation of the large bowel. Vascular/Lymphatic: Normal appearance of the abdominal aorta. No enlarged retroperitoneal or mesenteric adenopathy. No enlarged pelvic or inguinal lymph nodes. Reproductive: The uterus and  the adnexal structures have a normal appearance. Other: No free fluid or fluid collections within the abdomen or pelvis. Musculoskeletal: No acute bone abnormality. IMPRESSION: 1. No acute findings identified within the abdomen or pelvis. Electronically Signed   By: Signa Kell M.D.   On: 01/31/2016 21:22    MDM  Note from [facility] urgent care reviewed. Blood cultures drawn and pending and will be checked on by physician at [facility] urgent care. I don't feel the patient needs to be hospitalized. I consulted DrStewart via telephone. He suggested intravenous load with Keppra and prescription Keppra 500 mg twice a day. She'll be referred to neurologist as outpatient. She and father are in agreement with plan. Diagnoses #1 chronic abdominal pain. #2 seizure disorder #3 nausea vomiting diarrhea  Final diagnoses:  None        Doug Sou, MD 02/01/16 3875

## 2016-01-31 NOTE — ED Notes (Signed)
MD at bedside, pt increase in HR to 119. Pt twitching/siezing bilaterally, airway and o2 sats fine. No incontinence noted.

## 2016-01-31 NOTE — ED Notes (Signed)
Pt states she has had a "power line" placed to right upper chest 15 months ago. States that she has GI issues and has need LR through the line so she has had the "power line" longer than planned. States her fevers/headache/fatique have gotten worse. States she went to UC 2 nights ago and was instructed to come to ED.

## 2016-01-31 NOTE — ED Notes (Signed)
Pt now states that she is concerned about her line because it is itchy and red. States she last dressing change was this morning.

## 2016-01-31 NOTE — ED Notes (Signed)
Seizure pads applied.

## 2016-02-01 MED ORDER — LEVETIRACETAM 500 MG PO TABS: 500.0000 mg | ORAL_TABLET | Freq: Two times a day (BID) | ORAL | Status: DC

## 2016-02-01 NOTE — Discharge Instructions (Signed)
Seizure, Adult The medication prescribed to prevent seizures. Call [facility] or [facility] tomorrow to schedule next available appointment. Follow-up with your doctor at [facility] urgent care. Call tomorrow for next available appointment. Tell office staff that you had blood cultures performed here, which they can check A seizure means there is unusual activity in the brain. A seizure can cause changes in attention or behavior. Seizures often cause shaking (convulsions). Seizures often last from 30 seconds to 2 minutes. HOME CARE   If you are given medicines, take them exactly as told by your doctor.  Keep all doctor visits as told.  Do not swim or drive until your doctor says it is okay.  Teach others what to do if you have a seizure. They should:  Lay you on the ground.  Put a cushion under your head.  Loosen any tight clothing around your neck.  Turn you on your side.  Stay with you until you get better. GET HELP RIGHT AWAY IF:   The seizure lasts longer than 2 to 5 minutes.  The seizure is very bad.  The person does not wake up after the seizure.  The person's attention or behavior changes. Drive the person to the emergency room or call your local emergency services (911 in [place] MAKE SURE YOU:   Understand these instructions.  Will watch your condition.  Will get help right away if you are not doing well or get worse.   This information is not intended to replace advice given to you by your health care provider. Make sure you discuss any questions you have with your health care provider.   Document Released: 06/04/2008 Document Revised: 03/10/2012 Document Reviewed: 07/29/2013 Elsevier Interactive Patient Education [employer]

## 2016-02-01 NOTE — ED Notes (Signed)
Pt left at this time with all belongings.  

## 2016-02-05 LAB — CULTURE, BLOOD (ROUTINE X 2): Culture: NO GROWTH

## 2016-02-06 ENCOUNTER — Encounter: Payer: Self-pay | Admitting: Family Medicine

## 2016-02-06 LAB — CULTURE, BLOOD (ROUTINE X 2): CULTURE: NO GROWTH

## 2016-05-11 ENCOUNTER — Other Ambulatory Visit: Payer: Self-pay | Admitting: Family

## 2016-05-11 ENCOUNTER — Telehealth: Payer: Self-pay | Admitting: *Deleted

## 2016-05-11 NOTE — Telephone Encounter (Signed)
Received request from [facility] requesting that we provide patient with IV infusions for local care. They want patient to receive Venofer monthly, which Dr Myna HidalgoEnnever has agreed to. They also requested q2-3 week infusions of Meyers Cocktail, however Dr Myna HidalgoEnnever declines to provide these infusions at this time.  Letter and prescriptions placed into scan bin.  We will bring patient in next week to re-establish care and start monthly venofer infusions.

## 2016-05-14 ENCOUNTER — Encounter: Payer: Self-pay | Admitting: Hematology & Oncology

## 2016-05-16 ENCOUNTER — Other Ambulatory Visit (HOSPITAL_BASED_OUTPATIENT_CLINIC_OR_DEPARTMENT_OTHER): Payer: BLUE CROSS/BLUE SHIELD

## 2016-05-16 ENCOUNTER — Ambulatory Visit (HOSPITAL_BASED_OUTPATIENT_CLINIC_OR_DEPARTMENT_OTHER): Payer: BLUE CROSS/BLUE SHIELD | Admitting: Family

## 2016-05-16 ENCOUNTER — Other Ambulatory Visit: Payer: Self-pay | Admitting: Family

## 2016-05-16 ENCOUNTER — Ambulatory Visit (HOSPITAL_BASED_OUTPATIENT_CLINIC_OR_DEPARTMENT_OTHER): Payer: BLUE CROSS/BLUE SHIELD

## 2016-05-16 VITALS — BP 129/83 | HR 76 | Temp 98.6°F | Wt 171.0 lb

## 2016-05-16 DIAGNOSIS — D509 Iron deficiency anemia, unspecified: Secondary | ICD-10-CM

## 2016-05-16 DIAGNOSIS — R110 Nausea: Secondary | ICD-10-CM

## 2016-05-16 DIAGNOSIS — R112 Nausea with vomiting, unspecified: Secondary | ICD-10-CM | POA: Diagnosis not present

## 2016-05-16 LAB — CBC WITH DIFFERENTIAL (CANCER CENTER ONLY)
BASO#: 0 10*3/uL (ref 0.0–0.2)
BASO%: 0.2 % (ref 0.0–2.0)
EOS ABS: 0.3 10*3/uL (ref 0.0–0.5)
EOS%: 2.6 % (ref 0.0–7.0)
HCT: 35.5 % (ref 34.8–46.6)
HGB: 11.9 g/dL (ref 11.6–15.9)
LYMPH#: 6.1 10*3/uL — ABNORMAL HIGH (ref 0.9–3.3)
LYMPH%: 59.1 % — AB (ref 14.0–48.0)
MCH: 30.4 pg (ref 26.0–34.0)
MCHC: 33.5 g/dL (ref 32.0–36.0)
MCV: 91 fL (ref 81–101)
MONO#: 0.8 10*3/uL (ref 0.1–0.9)
MONO%: 7.2 % (ref 0.0–13.0)
NEUT#: 3.2 10*3/uL (ref 1.5–6.5)
NEUT%: 30.9 % — AB (ref 39.6–80.0)
PLATELETS: 358 10*3/uL (ref 145–400)
RBC: 3.91 10*6/uL (ref 3.70–5.32)
RDW: 12 % (ref 11.1–15.7)
WBC: 10.4 10*3/uL — ABNORMAL HIGH (ref 3.9–10.0)

## 2016-05-16 LAB — IRON AND TIBC
%SAT: 7 % — ABNORMAL LOW (ref 21–57)
Iron: 30 ug/dL — ABNORMAL LOW (ref 41–142)
TIBC: 433 ug/dL (ref 236–444)
UIBC: 403 ug/dL — AB (ref 120–384)

## 2016-05-16 LAB — FERRITIN: Ferritin: 39 ng/ml (ref 9–269)

## 2016-05-16 MED ORDER — ACETAMINOPHEN 500 MG PO TABS
ORAL_TABLET | ORAL | Status: AC
  Filled 2016-05-16: qty 2

## 2016-05-16 MED ORDER — HEPARIN SOD (PORK) LOCK FLUSH 100 UNIT/ML IV SOLN
250.0000 [IU] | Freq: Once | INTRAVENOUS | Status: AC | PRN
  Administered 2016-05-16: 250 [IU]
  Filled 2016-05-16: qty 5

## 2016-05-16 MED ORDER — ACETAMINOPHEN 500 MG PO TABS
1000.0000 mg | ORAL_TABLET | Freq: Once | ORAL | Status: AC
  Administered 2016-05-16: 1000 mg via ORAL

## 2016-05-16 MED ORDER — SODIUM CHLORIDE 0.9 % IV SOLN
Freq: Once | INTRAVENOUS | Status: AC
  Administered 2016-05-16: 11:00:00 via INTRAVENOUS
  Filled 2016-05-16: qty 4

## 2016-05-16 MED ORDER — DIPHENHYDRAMINE HCL 25 MG PO CAPS
ORAL_CAPSULE | ORAL | Status: AC
  Filled 2016-05-16: qty 2

## 2016-05-16 MED ORDER — SODIUM CHLORIDE 0.9 % IV SOLN
200.0000 mg | Freq: Once | INTRAVENOUS | Status: AC
  Administered 2016-05-16: 200 mg via INTRAVENOUS
  Filled 2016-05-16: qty 10

## 2016-05-16 MED ORDER — SODIUM CHLORIDE 0.9 % IJ SOLN
10.0000 mL | INTRAMUSCULAR | Status: DC | PRN
  Administered 2016-05-16: 10 mL
  Filled 2016-05-16: qty 10

## 2016-05-16 MED ORDER — DIPHENHYDRAMINE HCL 25 MG PO CAPS
50.0000 mg | ORAL_CAPSULE | Freq: Once | ORAL | Status: AC
Start: 2016-05-16 — End: 2016-05-16
  Administered 2016-05-16: 50 mg via ORAL

## 2016-05-16 MED ORDER — SODIUM CHLORIDE 0.9 % IV SOLN
Freq: Once | INTRAVENOUS | Status: AC
  Administered 2016-05-16: 11:00:00 via INTRAVENOUS

## 2016-05-16 NOTE — Progress Notes (Signed)
Hematology and Oncology Follow Up Visit  Dana Lozano [id number] 19-Sep-1981 35 y.o. 05/16/2016   Principle Diagnosis:  Iron deficiency anemia   Current Therapy:   IV iron (Venofer) as indicated    Interim History:  Dana Lozano is here today for follow-up and iron infusion. She is symptomatic at this time with fatigue, weakness and dizziness. She has multiple health issues and is on quite a bit of medication.  No lymphadenopathy found on exam. No episodes of bleeding or bruising.  No fever, chills, ice cravings, cough, rash, chest pain, palpitations or changes in bowel or bladder habits. She has IBS and gastroparesis and has abdominal cramping and bouts with diarrhea off and on.  She has some SOB with exertion at times. This resolves with taking a break to rest.  She does not have much of an appetite due to chronic n/v and most of her intake is fluid. She also requires hydration at home with LR through her port.  No swelling in her extremities at this time. The numbness and tingling in her hands and feet is unchanged. She takes Lyrica daily.   Medications:    Medication List       This list is accurate as of: 05/16/16 10:50 AM.  Always use your most recent med list.               buPROPion 300 MG 24 hr tablet  Commonly known as:  WELLBUTRIN XL  Take 300 mg by mouth daily.     cimetidine 200 MG tablet  Commonly known as:  TAGAMET  Take 200 mg by mouth 2 (two) times daily.     clonazePAM 1 MG tablet  Commonly known as:  KLONOPIN  Take 2 mg by mouth every evening.     colestipol 1 g tablet  Commonly known as:  COLESTID  Take 2 g by mouth 2 (two) times daily.     DEPLIN 15 MG Tabs  Take 15 mg by mouth every evening.     diazepam 5 MG tablet  Commonly known as:  VALIUM  Take 10 mg by mouth every 12 (twelve) hours as needed (for seizures).     dicyclomine 10 MG capsule  Commonly known as:  BENTYL  Take 10 mg by mouth daily as needed for spasms.     diphenoxylate-atropine 2.5-0.025 MG tablet  Commonly known as:  LOMOTIL  Take 2 tablets by mouth 4 (four) times daily as needed (IBS).     dronabinol 10 MG capsule  Commonly known as:  MARINOL  Take 10 mg by mouth 3 (three) times daily as needed (nausea/bone pain).     esomeprazole 20 MG capsule  Commonly known as:  NEXIUM  Take 40 mg by mouth 2 (two) times daily before a meal.     HYDROcodone-acetaminophen 10-325 MG tablet  Commonly known as:  NORCO  Take 10-325 tablets by mouth every 8 (eight) hours as needed.     hydrocortisone 5 MG tablet  Commonly known as:  CORTEF  Take 5 mg by mouth daily.     hydrOXYzine 25 MG tablet  Commonly known as:  ATARAX/VISTARIL  Take 25 mg by mouth at bedtime.     lactated ringers  Inject 1,000 mLs into the vein every other day.     lamoTRIgine 100 MG tablet  Commonly known as:  LAMICTAL  Take 200 mg by mouth 2 (two) times daily.     levETIRAcetam 500 MG tablet  Commonly known as:  KEPPRA  Take 1 tablet (500 mg total) by mouth 2 (two) times daily.     lidocaine 5 % ointment  Commonly known as:  XYLOCAINE  Apply 5 application topically every morning.     loratadine 10 MG tablet  Commonly known as:  CLARITIN  Take 10 mg by mouth every evening.     OXcarbazepine 150 MG tablet  Commonly known as:  TRILEPTAL  Take 300 mg by mouth 2 (two) times daily.     pentosan polysulfate 100 MG capsule  Commonly known as:  ELMIRON  Take 100 mg by mouth 3 (three) times daily.     pregabalin 25 MG capsule  Commonly known as:  LYRICA  Take 25 mg by mouth at bedtime.     PRESCRIPTION MEDICATION  See admin instructions. IV Zofran: sent to patient from [employer] [facility]:  [id number] [address], [place], [place] [address] [phone]     PROBIOTIC DAILY PO  Take 1 capsule by mouth at bedtime.     promethazine 25 MG suppository  Commonly known as:  PHENERGAN  Place 1 suppository (25 mg total) rectally every 6 (six) hours as needed for nausea or  vomiting.     QUEtiapine 25 MG tablet  Commonly known as:  SEROQUEL  Take 25 mg by mouth at bedtime.     SEASONALE 0.15-0.03 MG tablet  Generic drug:  levonorgestrel-ethinyl estradiol  Take 1 tablet by mouth every evening.     venlafaxine XR 75 MG 24 hr capsule  Commonly known as:  EFFEXOR-XR  Take 225 mg by mouth every morning.        Allergies:  Allergies  Allergen Reactions  . Ibuprofen Anaphylaxis  . Reglan [Metoclopramide] Other (See Comments)    Nerve pain  . Reglan [Metoclopramide] Other (See Comments)    Severe body pain   . Septra [Sulfamethoxazole-Trimethoprim] Other (See Comments)    Mouth sores/swelling  . Doxycycline Nausea And Vomiting  . Ibuprofen Swelling  . Lactose Intolerance (Gi)     Stomach cramps  . Septra [Sulfamethoxazole-Trimethoprim] Other (See Comments)    Severe mouth ulcers, possibly caused seizures    Past Medical History, Surgical history, Social history, and Family History were reviewed and updated.  Review of Systems: All other 10 point review of systems is negative.   Physical Exam:  weight is 171 lb (77.565 kg). Her oral temperature is 98.6 F (37 C). Her blood pressure is 129/83 and her pulse is 76.   Wt Readings from Last 3 Encounters:  05/16/16 171 lb (77.565 kg)  12/30/15 175 lb (79.379 kg)  06/01/15 198 lb (89.812 kg)    Ocular: Sclerae unicteric, pupils equal, round and reactive to light Ear-nose-throat: Oropharynx clear, dentition fair Lymphatic: No cervical supraclavicular or axillary adenopathy Lungs no rales or rhonchi, good excursion bilaterally Heart regular rate and rhythm, no murmur appreciated Abd soft, positive bowel sounds, no liver or spleen tip palpated on exam, no fluid wave MSK no focal spinal tenderness, no joint edema Neuro: non-focal, well-oriented, appropriate affect Breasts: Deferred  Lab Results  Component Value Date   WBC 10.4* 05/16/2016   HGB 11.9 05/16/2016   HCT 35.5 05/16/2016   MCV 91  05/16/2016   PLT 358 05/16/2016   Lab Results  Component Value Date   FERRITIN 303* 04/10/2013   IRON 137 04/10/2013   TIBC 473* 04/10/2013   UIBC 336 04/10/2013   IRONPCTSAT 29 04/10/2013   Lab Results  Component Value Date  RETICCTPCT 1.0 04/10/2013   RBC 3.91 05/16/2016   RETICCTABS 45.3 04/10/2013   No results found for: KPAFRELGTCHN, LAMBDASER, KAPLAMBRATIO No results found for: IGGSERUM, IGA, IGMSERUM No results found for: Marda StalkerOTALPROTELP, ALBUMINELP, A1GS, A2GS, BETS, BETA2SER, GAMS, MSPIKE, SPEI   Chemistry      Component Value Date/Time   NA 141 01/31/2016 1601   K 4.5 01/31/2016 1601   CL 106 01/31/2016 1601   CO2 24 01/31/2016 1601   BUN 9 01/31/2016 1601   CREATININE 0.83 01/31/2016 1601   CREATININE 0.85 01/29/2016 1656      Component Value Date/Time   CALCIUM 9.7 01/31/2016 1601   ALKPHOS 91 01/31/2016 1601   AST 21 01/31/2016 1601   ALT 26 01/31/2016 1601   BILITOT <0.1* 01/31/2016 1601     Impression and Plan: Ms. Marianna FussMcSwain is a very pleasant 35 yo white female with multiple health issues including iron deficiency anemia. She is on over 25 medications. Her ferritin with recent blood work was found to be low. She is symptomatic with fatigue, weakness and dizziness.  We will proceed with a dose of Venofer today as planned. She was premedicated for nausea.  We will plan to follow-up with her in one month repeat labs and possibly give another infusion.  She will contact us with any questions or concerns. We can certainly see her sooner if needed.   Verdie Mosher[place]INNAT[place],Olga Bourbeau M, NP 5/17/201710:50 AM

## 2016-05-16 NOTE — Patient Instructions (Signed)
Iron Sucrose injection  What is this medicine?  IRON SUCROSE (AHY ern SOO krohs) is an iron complex. Iron is used to make healthy red blood cells, which carry oxygen and nutrients throughout the body. This medicine is used to treat iron deficiency anemia in people with chronic kidney disease.  This medicine may be used for other purposes; ask your health care provider or pharmacist if you have questions.  What should I tell my health care provider before I take this medicine?  They need to know if you have any of these conditions:  -anemia not caused by low iron levels  -heart disease  -high levels of iron in the blood  -kidney disease  -liver disease  -an unusual or allergic reaction to iron, other medicines, foods, dyes, or preservatives  -pregnant or trying to get pregnant  -breast-feeding  How should I use this medicine?  This medicine is for infusion into a vein. It is given by a health care professional in a hospital or clinic setting.  Talk to your pediatrician regarding the use of this medicine in children. While this drug may be prescribed for children as young as 2 years for selected conditions, precautions do apply.  Overdosage: If you think you have taken too much of this medicine contact a poison control center or emergency room at once.  NOTE: This medicine is only for you. Do not share this medicine with others.  What if I miss a dose?  It is important not to miss your dose. Call your doctor or health care professional if you are unable to keep an appointment.  What may interact with this medicine?  Do not take this medicine with any of the following medications:  -deferoxamine  -dimercaprol  -other iron products  This medicine may also interact with the following medications:  -chloramphenicol  -deferasirox  This list may not describe all possible interactions. Give your health care provider a list of all the medicines, herbs, non-prescription drugs, or dietary supplements you use. Also tell them if  you smoke, drink alcohol, or use illegal drugs. Some items may interact with your medicine.  What should I watch for while using this medicine?  Visit your doctor or healthcare professional regularly. Tell your doctor or healthcare professional if your symptoms do not start to get better or if they get worse. You may need blood work done while you are taking this medicine.  You may need to follow a special diet. Talk to your doctor. Foods that contain iron include: whole grains/cereals, dried fruits, beans, or peas, leafy green vegetables, and organ meats (liver, kidney).  What side effects may I notice from receiving this medicine?  Side effects that you should report to your doctor or health care professional as soon as possible:  -allergic reactions like skin rash, itching or hives, swelling of the face, lips, or tongue  -breathing problems  -changes in blood pressure  -cough  -fast, irregular heartbeat  -feeling faint or lightheaded, falls  -fever or chills  -flushing, sweating, or hot feelings  -joint or muscle aches/pains  -seizures  -swelling of the ankles or feet  -unusually weak or tired  Side effects that usually do not require medical attention (report to your doctor or health care professional if they continue or are bothersome):  -diarrhea  -feeling achy  -headache  -irritation at site where injected  -nausea, vomiting  -stomach upset  -tiredness  This list may not describe all possible side effects. Call your doctor   for medical advice about side effects. You may report side effects to FDA at 1-800-FDA-1088.  Where should I keep my medicine?  This drug is given in a hospital or clinic and will not be stored at home.  NOTE: This sheet is a summary. It may not cover all possible information. If you have questions about this medicine, talk to your doctor, pharmacist, or health care provider.      2016, Elsevier/Gold Standard. (2011-09-27 17:14:35)

## 2016-05-17 LAB — RETICULOCYTES: Reticulocyte Count: 0.8 % (ref 0.6–2.6)

## 2016-05-18 ENCOUNTER — Telehealth: Payer: Self-pay | Admitting: *Deleted

## 2016-05-18 NOTE — Telephone Encounter (Addendum)
Patient's mother is aware of results. Patient is busy with other appointments and IV therapies. They will call and schedule if she is available to schedule an additional infusion before her next scheduled one in June.   ----- Message from Verdie MosherSarah M [place], NP sent at 05/17/2016 11:07 AM EDT ----- Regarding: Iron Iron saturation is quite low. She will need a second dose of Venofer in 2 weeks or so once she has finished her in home antibiotic therapy. Thank you!!  Maralyn SagoSarah  ----- Message -----    From: [facility]    Sent: 05/16/2016  10:11 AM      To: Verdie MosherSarah M [place], NP

## 2016-06-13 ENCOUNTER — Ambulatory Visit (HOSPITAL_BASED_OUTPATIENT_CLINIC_OR_DEPARTMENT_OTHER): Payer: BLUE CROSS/BLUE SHIELD

## 2016-06-13 ENCOUNTER — Ambulatory Visit (HOSPITAL_BASED_OUTPATIENT_CLINIC_OR_DEPARTMENT_OTHER): Payer: BLUE CROSS/BLUE SHIELD | Admitting: Family

## 2016-06-13 ENCOUNTER — Other Ambulatory Visit (HOSPITAL_BASED_OUTPATIENT_CLINIC_OR_DEPARTMENT_OTHER): Payer: BLUE CROSS/BLUE SHIELD

## 2016-06-13 ENCOUNTER — Encounter: Payer: Self-pay | Admitting: Family

## 2016-06-13 VITALS — BP 111/67 | HR 81 | Temp 98.1°F | Resp 16 | Ht 68.0 in | Wt 171.0 lb

## 2016-06-13 DIAGNOSIS — R110 Nausea: Secondary | ICD-10-CM

## 2016-06-13 DIAGNOSIS — D509 Iron deficiency anemia, unspecified: Secondary | ICD-10-CM

## 2016-06-13 LAB — CBC WITH DIFFERENTIAL (CANCER CENTER ONLY)
BASO#: 0 10*3/uL (ref 0.0–0.2)
BASO%: 0.2 % (ref 0.0–2.0)
EOS%: 3.2 % (ref 0.0–7.0)
Eosinophils Absolute: 0.3 10*3/uL (ref 0.0–0.5)
HEMATOCRIT: 38.5 % (ref 34.8–46.6)
HGB: 12.8 g/dL (ref 11.6–15.9)
LYMPH#: 5.5 10*3/uL — AB (ref 0.9–3.3)
LYMPH%: 60.9 % — ABNORMAL HIGH (ref 14.0–48.0)
MCH: 30.2 pg (ref 26.0–34.0)
MCHC: 33.2 g/dL (ref 32.0–36.0)
MCV: 91 fL (ref 81–101)
MONO#: 0.6 10*3/uL (ref 0.1–0.9)
MONO%: 7.1 % (ref 0.0–13.0)
NEUT%: 28.6 % — AB (ref 39.6–80.0)
NEUTROS ABS: 2.6 10*3/uL (ref 1.5–6.5)
Platelets: 346 10*3/uL (ref 145–400)
RBC: 4.24 10*6/uL (ref 3.70–5.32)
RDW: 12 % (ref 11.1–15.7)
WBC: 9 10*3/uL (ref 3.9–10.0)

## 2016-06-13 LAB — IRON AND TIBC
%SAT: 14 % — ABNORMAL LOW (ref 21–57)
IRON: 55 ug/dL (ref 41–142)
TIBC: 404 ug/dL (ref 236–444)
UIBC: 348 ug/dL (ref 120–384)

## 2016-06-13 LAB — FERRITIN: FERRITIN: 101 ng/mL (ref 9–269)

## 2016-06-13 MED ORDER — HEPARIN SOD (PORK) LOCK FLUSH 100 UNIT/ML IV SOLN
250.0000 [IU] | Freq: Once | INTRAVENOUS | Status: AC | PRN
  Administered 2016-06-13: 250 [IU]
  Filled 2016-06-13: qty 5

## 2016-06-13 MED ORDER — DIPHENHYDRAMINE HCL 25 MG PO CAPS
ORAL_CAPSULE | ORAL | Status: AC
  Filled 2016-06-13: qty 2

## 2016-06-13 MED ORDER — SODIUM CHLORIDE 0.9 % IV SOLN
200.0000 mg | Freq: Once | INTRAVENOUS | Status: AC
  Administered 2016-06-13: 200 mg via INTRAVENOUS
  Filled 2016-06-13: qty 10

## 2016-06-13 MED ORDER — SODIUM CHLORIDE 0.9 % IV SOLN
Freq: Once | INTRAVENOUS | Status: AC
  Administered 2016-06-13: 10:00:00 via INTRAVENOUS

## 2016-06-13 MED ORDER — ACETAMINOPHEN 500 MG PO TABS
1000.0000 mg | ORAL_TABLET | Freq: Once | ORAL | Status: AC
  Administered 2016-06-13: 1000 mg via ORAL

## 2016-06-13 MED ORDER — DIPHENHYDRAMINE HCL 25 MG PO CAPS
50.0000 mg | ORAL_CAPSULE | Freq: Once | ORAL | Status: AC
  Administered 2016-06-13: 50 mg via ORAL

## 2016-06-13 MED ORDER — ACETAMINOPHEN 500 MG PO TABS
ORAL_TABLET | ORAL | Status: AC
  Filled 2016-06-13: qty 2

## 2016-06-13 MED ORDER — SODIUM CHLORIDE 0.9 % IJ SOLN
10.0000 mL | INTRAMUSCULAR | Status: DC | PRN
  Administered 2016-06-13: 10 mL
  Filled 2016-06-13: qty 10

## 2016-06-13 NOTE — Patient Instructions (Signed)
Iron Sucrose injection  What is this medicine?  IRON SUCROSE (AHY ern SOO krohs) is an iron complex. Iron is used to make healthy red blood cells, which carry oxygen and nutrients throughout the body. This medicine is used to treat iron deficiency anemia in people with chronic kidney disease.  This medicine may be used for other purposes; ask your health care provider or pharmacist if you have questions.  What should I tell my health care provider before I take this medicine?  They need to know if you have any of these conditions:  -anemia not caused by low iron levels  -heart disease  -high levels of iron in the blood  -kidney disease  -liver disease  -an unusual or allergic reaction to iron, other medicines, foods, dyes, or preservatives  -pregnant or trying to get pregnant  -breast-feeding  How should I use this medicine?  This medicine is for infusion into a vein. It is given by a health care professional in a hospital or clinic setting.  Talk to your pediatrician regarding the use of this medicine in children. While this drug may be prescribed for children as young as 2 years for selected conditions, precautions do apply.  Overdosage: If you think you have taken too much of this medicine contact a poison control center or emergency room at once.  NOTE: This medicine is only for you. Do not share this medicine with others.  What if I miss a dose?  It is important not to miss your dose. Call your doctor or health care professional if you are unable to keep an appointment.  What may interact with this medicine?  Do not take this medicine with any of the following medications:  -deferoxamine  -dimercaprol  -other iron products  This medicine may also interact with the following medications:  -chloramphenicol  -deferasirox  This list may not describe all possible interactions. Give your health care provider a list of all the medicines, herbs, non-prescription drugs, or dietary supplements you use. Also tell them if  you smoke, drink alcohol, or use illegal drugs. Some items may interact with your medicine.  What should I watch for while using this medicine?  Visit your doctor or healthcare professional regularly. Tell your doctor or healthcare professional if your symptoms do not start to get better or if they get worse. You may need blood work done while you are taking this medicine.  You may need to follow a special diet. Talk to your doctor. Foods that contain iron include: whole grains/cereals, dried fruits, beans, or peas, leafy green vegetables, and organ meats (liver, kidney).  What side effects may I notice from receiving this medicine?  Side effects that you should report to your doctor or health care professional as soon as possible:  -allergic reactions like skin rash, itching or hives, swelling of the face, lips, or tongue  -breathing problems  -changes in blood pressure  -cough  -fast, irregular heartbeat  -feeling faint or lightheaded, falls  -fever or chills  -flushing, sweating, or hot feelings  -joint or muscle aches/pains  -seizures  -swelling of the ankles or feet  -unusually weak or tired  Side effects that usually do not require medical attention (report to your doctor or health care professional if they continue or are bothersome):  -diarrhea  -feeling achy  -headache  -irritation at site where injected  -nausea, vomiting  -stomach upset  -tiredness  This list may not describe all possible side effects. Call your doctor   for medical advice about side effects. You may report side effects to FDA at 1-800-FDA-1088.  Where should I keep my medicine?  This drug is given in a hospital or clinic and will not be stored at home.  NOTE: This sheet is a summary. It may not cover all possible information. If you have questions about this medicine, talk to your doctor, pharmacist, or health care provider.      2016, Elsevier/Gold Standard. (2011-09-27 17:14:35)

## 2016-06-13 NOTE — Progress Notes (Signed)
Hematology and Oncology Follow Up Visit  Dana Lozano [id number] 11-18-1981 35 y.o. 06/13/2016   Principle Diagnosis:  Iron deficiency anemia   Current Therapy:   IV iron (Venofer) as indicated    Interim History:  Dana Lozano is here today for follow-up and a second iron infusion. She received Venofer in May and could tell that she had an improvement in her symptoms.  She is symptomatic at this time with fatigue, weakness, SOB with exertion and dizziness.  No fever, chills, ice cravings, cough, rash, chest pain, palpitations or changes in bowel or bladder habits.  She has IBS and gastroparesis and has abdominal cramping and bouts with diarrhea off and on. She is currently on Meropenum for this.  No lymphadenopathy found on exam. No episodes of bleeding or bruising.  Her appetite has improved and she is slowly adding solid food back into her diet. She requires hydration at home with LR through her port 3 days a week.  No swelling or tenderness in her extremities at this time. The numbness and tingling in her hands and feet is unchanged.   Medications:    Medication List       This list is accurate as of: 06/13/16  9:30 AM.  Always use your most recent med list.               buPROPion 300 MG 24 hr tablet  Commonly known as:  WELLBUTRIN XL  Take 300 mg by mouth daily.     cimetidine 200 MG tablet  Commonly known as:  TAGAMET  Take 200 mg by mouth 2 (two) times daily.     clonazePAM 1 MG tablet  Commonly known as:  KLONOPIN  Take 2 mg by mouth every evening.     colestipol 1 g tablet  Commonly known as:  COLESTID  Take 2 g by mouth 2 (two) times daily.     DEPLIN 15 MG Tabs  Take 15 mg by mouth every evening.     diazepam 5 MG tablet  Commonly known as:  VALIUM  Take 10 mg by mouth every 12 (twelve) hours as needed (for seizures).     dicyclomine 10 MG capsule  Commonly known as:  BENTYL  Take 10 mg by mouth daily as needed for spasms.     diphenoxylate-atropine 2.5-0.025 MG tablet  Commonly known as:  LOMOTIL  Take 2 tablets by mouth 4 (four) times daily as needed (IBS).     dronabinol 10 MG capsule  Commonly known as:  MARINOL  Take 10 mg by mouth 3 (three) times daily as needed (nausea/bone pain).     esomeprazole 20 MG capsule  Commonly known as:  NEXIUM  Take 40 mg by mouth 2 (two) times daily before a meal.     HYDROcodone-acetaminophen 10-325 MG tablet  Commonly known as:  NORCO  Take 10-325 tablets by mouth every 8 (eight) hours as needed.     hydrocortisone 5 MG tablet  Commonly known as:  CORTEF  Take 5 mg by mouth daily.     hydrOXYzine 25 MG tablet  Commonly known as:  ATARAX/VISTARIL  Take 25 mg by mouth at bedtime.     lactated ringers  Inject 1,000 mLs into the vein every other day.     lamoTRIgine 100 MG tablet  Commonly known as:  LAMICTAL  Take 200 mg by mouth 2 (two) times daily.     levETIRAcetam 500 MG tablet  Commonly known as:  KEPPRA  Take 1 tablet (500 mg total) by mouth 2 (two) times daily.     lidocaine 5 % ointment  Commonly known as:  XYLOCAINE  Apply 5 application topically every morning.     loratadine 10 MG tablet  Commonly known as:  CLARITIN  Take 10 mg by mouth every evening.     OXcarbazepine 150 MG tablet  Commonly known as:  TRILEPTAL  Take 300 mg by mouth 2 (two) times daily.     pentosan polysulfate 100 MG capsule  Commonly known as:  ELMIRON  Take 100 mg by mouth 3 (three) times daily.     pregabalin 25 MG capsule  Commonly known as:  LYRICA  Take 25 mg by mouth at bedtime.     PRESCRIPTION MEDICATION  See admin instructions. IV Zofran: sent to patient from [facility]:  [phone] [address], [place], [place] [id number] [phone]     PROBIOTIC DAILY PO  Take 1 capsule by mouth at bedtime.     promethazine 25 MG suppository  Commonly known as:  PHENERGAN  Place 1 suppository (25 mg total) rectally every 6 (six) hours as needed for nausea or  vomiting.     QUEtiapine 25 MG tablet  Commonly known as:  SEROQUEL  Take 25 mg by mouth at bedtime.     SEASONALE 0.15-0.03 MG tablet  Generic drug:  levonorgestrel-ethinyl estradiol  Take 1 tablet by mouth every evening.     venlafaxine XR 75 MG 24 hr capsule  Commonly known as:  EFFEXOR-XR  Take 225 mg by mouth every morning.        Allergies:  Allergies  Allergen Reactions  . Ibuprofen Anaphylaxis  . Reglan [Metoclopramide] Other (See Comments)    Nerve pain  . Reglan [Metoclopramide] Other (See Comments)    Severe body pain   . Septra [Sulfamethoxazole-Trimethoprim] Other (See Comments)    Mouth sores/swelling  . Doxycycline Nausea And Vomiting  . Ibuprofen Swelling  . Lactose Intolerance (Gi)     Stomach cramps  . Septra [Sulfamethoxazole-Trimethoprim] Other (See Comments)    Severe mouth ulcers, possibly caused seizures    Past Medical History, Surgical history, Social history, and Family History were reviewed and updated.  Review of Systems: All other 10 point review of systems is negative.   Physical Exam:  vitals were not taken for this visit.  Wt Readings from Last 3 Encounters:  05/16/16 171 lb (77.565 kg)  12/30/15 175 lb (79.379 kg)  06/01/15 198 lb (89.812 kg)    Ocular: Sclerae unicteric, pupils equal, round and reactive to light Ear-nose-throat: Oropharynx clear, dentition fair Lymphatic: No cervical supraclavicular or axillary adenopathy Lungs no rales or rhonchi, good excursion bilaterally Heart regular rate and rhythm, no murmur appreciated Abd soft, positive bowel sounds, no liver or spleen tip palpated on exam, no fluid wave MSK no focal spinal tenderness, no joint edema Neuro: non-focal, well-oriented, appropriate affect Breasts: Deferred  Lab Results  Component Value Date   WBC 10.4* 05/16/2016   HGB 11.9 05/16/2016   HCT 35.5 05/16/2016   MCV 91 05/16/2016   PLT 358 05/16/2016   Lab Results  Component Value Date   FERRITIN  39 05/16/2016   IRON 30* 05/16/2016   TIBC 433 05/16/2016   UIBC 403* 05/16/2016   IRONPCTSAT 7* 05/16/2016   Lab Results  Component Value Date   RETICCTPCT 1.0 04/10/2013   RBC 3.91 05/16/2016   RETICCTABS 45.3 04/10/2013   No results found for:  KPAFRELGTCHN, LAMBDASER, KAPLAMBRATIO No results found for: IGGSERUM, IGA, IGMSERUM No results found for: Dorene Ar, A1GS, A2GS, Karn Pickler, SPEI   Chemistry      Component Value Date/Time   NA 141 01/31/2016 1601   K 4.5 01/31/2016 1601   CL 106 01/31/2016 1601   CO2 24 01/31/2016 1601   BUN 9 01/31/2016 1601   CREATININE 0.83 01/31/2016 1601   CREATININE 0.85 01/29/2016 1656      Component Value Date/Time   CALCIUM 9.7 01/31/2016 1601   ALKPHOS 91 01/31/2016 1601   AST 21 01/31/2016 1601   ALT 26 01/31/2016 1601   BILITOT <0.1* 01/31/2016 1601     Impression and Plan: Dana Lozano is a very pleasant 35 yo white female with multiple health issues including iron deficiency anemia. She is on over 25 medications.  She is symptomatic with fatigue, weakness, SOB and dizziness. Her iron saturation was 7% with a ferritin of 39.  We will proceed with a dose of Venofer today as planned. We will see what today's lab work shows and determine at that time if she will need a 3rd dose.  We will plan to follow-up with her in 6 weeks repeat labs.  She will contact us with any questions or concerns. We can certainly see her sooner if needed.   Verdie Mosher, NP 6/14/20179:30 AM

## 2016-06-14 LAB — RETICULOCYTES: RETICULOCYTE COUNT: 0.9 % (ref 0.6–2.6)

## 2016-08-01 ENCOUNTER — Ambulatory Visit: Payer: BLUE CROSS/BLUE SHIELD

## 2016-08-01 ENCOUNTER — Ambulatory Visit: Payer: BLUE CROSS/BLUE SHIELD | Admitting: Hematology & Oncology

## 2016-08-01 ENCOUNTER — Other Ambulatory Visit: Payer: BLUE CROSS/BLUE SHIELD

## 2016-08-09 ENCOUNTER — Ambulatory Visit: Payer: BLUE CROSS/BLUE SHIELD | Admitting: Hematology & Oncology

## 2016-08-09 ENCOUNTER — Ambulatory Visit: Payer: BLUE CROSS/BLUE SHIELD

## 2016-08-09 ENCOUNTER — Other Ambulatory Visit: Payer: BLUE CROSS/BLUE SHIELD

## 2016-10-13 IMAGING — DX DG CHEST 1V PORT
1 series · 1 of 1 positions shown · non-contrast
Comparison: 02/04/2014.

CLINICAL DATA: Initial encounter for right-sided chest pain with
central line complications. Shortness of breath with palpitations.

EXAM:
PORTABLE CHEST - 1 VIEW

[chest ap]
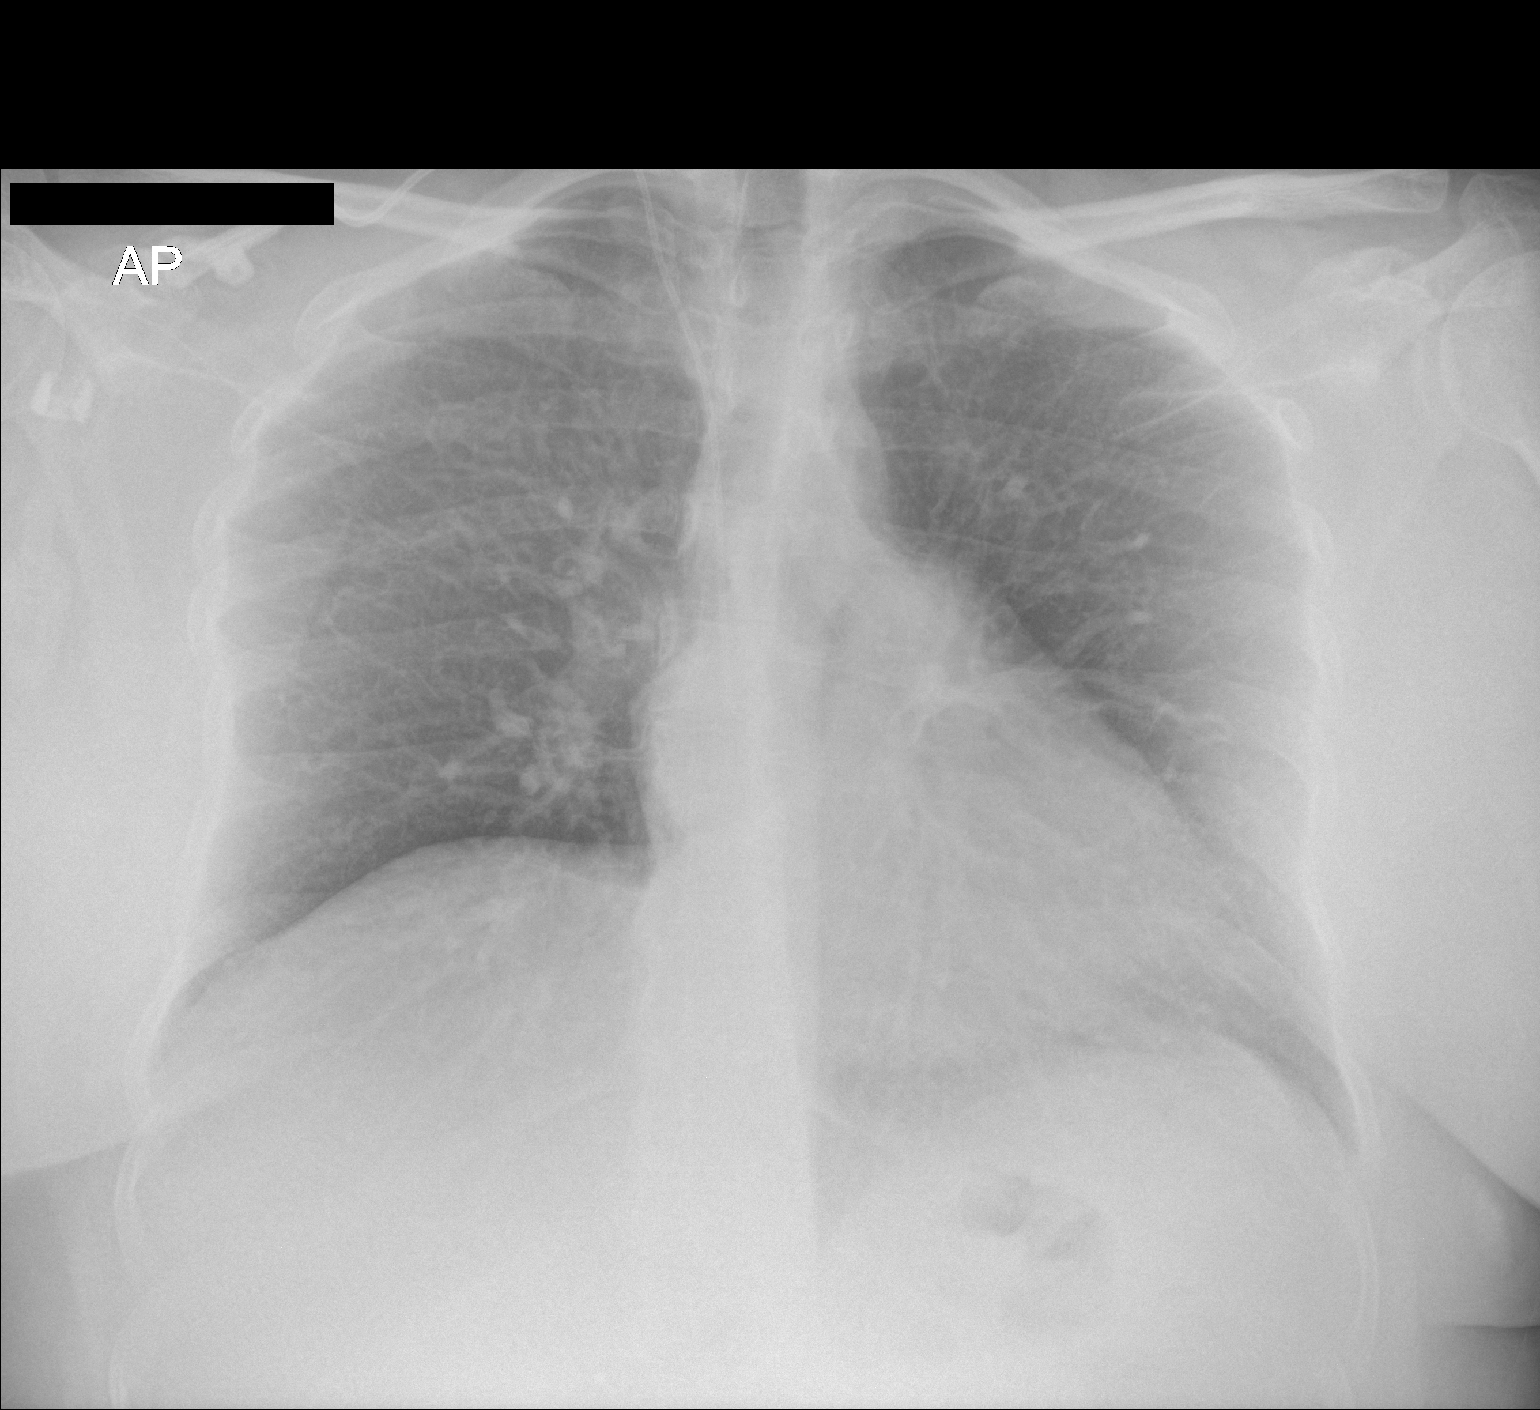

[1 of 1 positions shown; findings below may reference images not displayed]

FINDINGS: Right IJ central line tip projects over the expected location of the
distal SVC. Vascular congestion noted with prominence of the
interstitial markings. No focal airspace consolidation or pleural
effusion. Cardiopericardial silhouette is at upper limits of normal
for size. Imaged bony structures of the thorax are intact.
IMPRESSION: Vascular congestion with interstitial prominence.

## 2017-01-25 ENCOUNTER — Telehealth: Payer: Self-pay | Admitting: *Deleted

## 2017-01-25 ENCOUNTER — Other Ambulatory Visit: Payer: Self-pay | Admitting: *Deleted

## 2017-01-25 DIAGNOSIS — D509 Iron deficiency anemia, unspecified: Secondary | ICD-10-CM

## 2017-01-25 NOTE — Telephone Encounter (Signed)
Patient's mom is calling. She wants to re-establish care here with venofer infusions. Appointment made for patient for labs and appointment with Sarah.

## 2017-01-30 ENCOUNTER — Ambulatory Visit (HOSPITAL_BASED_OUTPATIENT_CLINIC_OR_DEPARTMENT_OTHER): Payer: BLUE CROSS/BLUE SHIELD | Admitting: Family

## 2017-01-30 ENCOUNTER — Other Ambulatory Visit: Payer: BLUE CROSS/BLUE SHIELD

## 2017-01-30 ENCOUNTER — Other Ambulatory Visit (HOSPITAL_BASED_OUTPATIENT_CLINIC_OR_DEPARTMENT_OTHER): Payer: BLUE CROSS/BLUE SHIELD

## 2017-01-30 VITALS — BP 138/62 | HR 124 | Wt 176.0 lb

## 2017-01-30 DIAGNOSIS — D509 Iron deficiency anemia, unspecified: Secondary | ICD-10-CM

## 2017-01-30 DIAGNOSIS — R509 Fever, unspecified: Secondary | ICD-10-CM

## 2017-01-30 LAB — CBC WITH DIFFERENTIAL (CANCER CENTER ONLY)
BASO#: 0 10*3/uL (ref 0.0–0.2)
BASO%: 0.2 % (ref 0.0–2.0)
EOS ABS: 0.3 10*3/uL (ref 0.0–0.5)
EOS%: 3.2 % (ref 0.0–7.0)
HCT: 37.5 % (ref 34.8–46.6)
HGB: 12.4 g/dL (ref 11.6–15.9)
LYMPH#: 5.4 10*3/uL — ABNORMAL HIGH (ref 0.9–3.3)
LYMPH%: 63 % — AB (ref 14.0–48.0)
MCH: 29.3 pg (ref 26.0–34.0)
MCHC: 33.1 g/dL (ref 32.0–36.0)
MCV: 89 fL (ref 81–101)
MONO#: 0.6 10*3/uL (ref 0.1–0.9)
MONO%: 7.2 % (ref 0.0–13.0)
NEUT#: 2.3 10*3/uL (ref 1.5–6.5)
NEUT%: 26.4 % — AB (ref 39.6–80.0)
PLATELETS: 323 10*3/uL (ref 145–400)
RBC: 4.23 10*6/uL (ref 3.70–5.32)
RDW: 11.8 % (ref 11.1–15.7)
WBC: 8.6 10*3/uL (ref 3.9–10.0)

## 2017-01-30 LAB — COMPREHENSIVE METABOLIC PANEL
ALT: 10 U/L (ref 0–55)
AST: 13 U/L (ref 5–34)
Albumin: 3.7 g/dL (ref 3.5–5.0)
Alkaline Phosphatase: 101 U/L (ref 40–150)
Anion Gap: 8 mEq/L (ref 3–11)
BUN: 10 mg/dL (ref 7.0–26.0)
CO2: 27 mEq/L (ref 22–29)
Calcium: 9.6 mg/dL (ref 8.4–10.4)
Chloride: 107 mEq/L (ref 98–109)
Creatinine: 0.9 mg/dL (ref 0.6–1.1)
EGFR: 87 mL/min/{1.73_m2} — AB (ref >90)
GLUCOSE: 87 mg/dL (ref 70–140)
POTASSIUM: 4 meq/L (ref 3.5–5.1)
SODIUM: 142 meq/L (ref 136–145)
TOTAL PROTEIN: 7.3 g/dL (ref 6.4–8.3)

## 2017-01-30 NOTE — Progress Notes (Signed)
Patient became very emotional and started crying when being asked assessment questions.  Dr. Myna HidalgoEnnever present, and asked that patient be placed in exam room to be evaluated.

## 2017-01-30 NOTE — Progress Notes (Signed)
Hematology and Oncology Follow Up Visit  Dana Lozano [id number] 26-Sep-1981 36 y.o. 01/30/2017   Principle Diagnosis:  Iron deficiency anemia   Current Therapy:   IV iron (Venofer) as indicated - last received in June 2017    Interim History:  Dana Lozano is here today for follow-up. She is tearful and concerned about her Hickman catheter being infected. She has had some pain and redness at the sight as well as a bit of discharge. She has a low grade temperature of 99.3 and HR of 124.  She would like the line to be removed. She states that the line was placed by a her Lyme disease specialty team in [place] 2 years ago. She does flush the line at home every 3 days.  With [place] along with her dependence on nutrition and other medications given through the line periodically for treatment of the Lyme disease, we can not legally be responsible for removal. She also states that when her first line was removed prior to the present line being placed there was quite a bit of difficulty with removal due to scar tissue wrapped around the catheter. I advised that she continue to follow-up with her [place] Dr. Penny Pia, and have them order the removal of the line.  As for her iron deficiency, she last received Venofer in June 2017. She c/o fatigue/weakness and generalized aches and pains. Her CBC today is stable. Hgb 12.4 with an MCV of 89. Platelet count is 323. Iron studies are pending.  No chills, ice cravings, cough, rash, chest pain, palpitations or changes in bowel or bladder habits.  She has IBS and gastroparesis with abdominal cramping and bouts of diarrhea off and on. She has some nausea and vomiting associated with this as well.  Her appetite is down and she mostly sticks to liquids. Her weight is stable.  No lymphadenopathy found on exam. No episodes of bleeding or bruising.  No swelling or tenderness in her extremities at this time. The  numbness and tingling in her hands and feet is unchanged.   Medications:  Allergies as of 01/30/2017      Reactions   Ibuprofen Anaphylaxis   Reglan [metoclopramide] Other (See Comments)   Nerve pain   Reglan [metoclopramide] Other (See Comments)   Severe body pain   Septra [sulfamethoxazole-trimethoprim] Other (See Comments)   Mouth sores/swelling   Doxycycline Nausea And Vomiting   Ibuprofen Swelling   Lactose Intolerance (gi)    Stomach cramps   Septra [sulfamethoxazole-trimethoprim] Other (See Comments)   Severe mouth ulcers, possibly caused seizures      Medication List       Accurate as of 01/30/17 12:33 PM. Always use your most recent med list.          buPROPion 300 MG 24 hr tablet Commonly known as:  WELLBUTRIN XL Take 300 mg by mouth daily.   cimetidine 200 MG tablet Commonly known as:  TAGAMET Take 200 mg by mouth 2 (two) times daily.   clonazePAM 1 MG tablet Commonly known as:  KLONOPIN Take 2 mg by mouth every evening.   colestipol 1 g tablet Commonly known as:  COLESTID Take 2 g by mouth 2 (two) times daily.   DEPLIN 15 MG Tabs Take 15 mg by mouth every evening.   diazepam 10 MG tablet Commonly known as:  VALIUM   dicyclomine 10 MG capsule Commonly known as:  BENTYL Take 10 mg by mouth  daily as needed for spasms.   diphenoxylate-atropine 2.5-0.025 MG tablet Commonly known as:  LOMOTIL Take 2 tablets by mouth 4 (four) times daily as needed (IBS).   dronabinol 10 MG capsule Commonly known as:  MARINOL Take 10 mg by mouth 3 (three) times daily as needed (nausea/bone pain).   esomeprazole 20 MG capsule Commonly known as:  NEXIUM Take 40 mg by mouth 2 (two) times daily before a meal.   HYDROcodone-acetaminophen 10-325 MG tablet Commonly known as:  NORCO Take 10-325 tablets by mouth every 8 (eight) hours as needed.   hydrocortisone 5 MG tablet Commonly known as:  CORTEF Take 5 mg by mouth daily.   hydrOXYzine 25 MG tablet Commonly known  as:  ATARAX/VISTARIL Take 25 mg by mouth at bedtime.   lactated ringers Inject 1,000 mLs into the vein every other day.   lamoTRIgine 100 MG tablet Commonly known as:  LAMICTAL Take 200 mg by mouth 2 (two) times daily.   levETIRAcetam 500 MG tablet Commonly known as:  KEPPRA Take 1 tablet (500 mg total) by mouth 2 (two) times daily.   lidocaine 5 % ointment Commonly known as:  XYLOCAINE Apply 5 application topically every morning.   loratadine 10 MG tablet Commonly known as:  CLARITIN Take 10 mg by mouth every evening.   OXcarbazepine 150 MG tablet Commonly known as:  TRILEPTAL Take 300 mg by mouth 2 (two) times daily.   pentosan polysulfate 100 MG capsule Commonly known as:  ELMIRON Take 100 mg by mouth 3 (three) times daily.   pregabalin 25 MG capsule Commonly known as:  LYRICA Take 25 mg by mouth at bedtime.   PRESCRIPTION MEDICATION See admin instructions. IV Zofran: sent to patient from [facility]:  [phone] [address], [place], [place] [id number] [phone]   PROBIOTIC DAILY PO Take 1 capsule by mouth at bedtime.   promethazine 25 MG suppository Commonly known as:  PHENERGAN Place 1 suppository (25 mg total) rectally every 6 (six) hours as needed for nausea or vomiting.   QUEtiapine 25 MG tablet Commonly known as:  SEROQUEL Take 25 mg by mouth at bedtime.   scopolamine 1 MG/3DAYS Commonly known as:  TRANSDERM-SCOP Place onto the skin.   SEASONALE 0.15-0.03 MG tablet Generic drug:  levonorgestrel-ethinyl estradiol Take 1 tablet by mouth every evening.   venlafaxine XR 75 MG 24 hr capsule Commonly known as:  EFFEXOR-XR Take 225 mg by mouth every morning.       Allergies:  Allergies  Allergen Reactions  . Ibuprofen Anaphylaxis  . Reglan [Metoclopramide] Other (See Comments)    Nerve pain  . Reglan [Metoclopramide] Other (See Comments)    Severe body pain   . Septra [Sulfamethoxazole-Trimethoprim] Other (See Comments)    Mouth  sores/swelling  . Doxycycline Nausea And Vomiting  . Ibuprofen Swelling  . Lactose Intolerance (Gi)     Stomach cramps  . Septra [Sulfamethoxazole-Trimethoprim] Other (See Comments)    Severe mouth ulcers, possibly caused seizures    Past Medical History, Surgical history, Social history, and Family History were reviewed and updated.  Review of Systems: All other 10 point review of systems is negative.   Physical Exam:  weight is 176 lb (79.8 kg). Her blood pressure is 138/62 and her pulse is 124 (abnormal).   Wt Readings from Last 3 Encounters:  01/30/17 176 lb (79.8 kg)  06/13/16 171 lb (77.6 kg)  05/16/16 171 lb (77.6 kg)    Ocular: Sclerae unicteric, pupils equal,  round and reactive to light Ear-nose-throat: Oropharynx clear, dentition fair Lymphatic: No cervical supraclavicular or axillary adenopathy Lungs no rales or rhonchi, good excursion bilaterally Heart regular rate and rhythm, no murmur appreciated Abd soft, positive bowel sounds, no liver or spleen tip palpated on exam, no fluid wave MSK no focal spinal tenderness, no joint edema Neuro: non-focal, well-oriented, appropriate affect Breasts: Deferred  Lab Results  Component Value Date   WBC 8.6 01/30/2017   HGB 12.4 01/30/2017   HCT 37.5 01/30/2017   MCV 89 01/30/2017   PLT 323 01/30/2017   Lab Results  Component Value Date   FERRITIN 101 06/13/2016   IRON 55 06/13/2016   TIBC 404 06/13/2016   UIBC 348 06/13/2016   IRONPCTSAT 14 (L) 06/13/2016   Lab Results  Component Value Date   RETICCTPCT 1.0 04/10/2013   RBC 4.23 01/30/2017   RETICCTABS 45.3 04/10/2013   No results found for: KPAFRELGTCHN, LAMBDASER, KAPLAMBRATIO No results found for: IGGSERUM, IGA, IGMSERUM No results found for: Marda Stalker, SPEI   Chemistry      Component Value Date/Time   NA 141 01/31/2016 1601   K 4.5 01/31/2016 1601   CL 106 01/31/2016 1601   CO2 24 01/31/2016  1601   BUN 9 01/31/2016 1601   CREATININE 0.83 01/31/2016 1601   CREATININE 0.85 01/29/2016 1656      Component Value Date/Time   CALCIUM 9.7 01/31/2016 1601   ALKPHOS 91 01/31/2016 1601   AST 21 01/31/2016 1601   ALT 26 01/31/2016 1601   BILITOT <0.1 (L) 01/31/2016 1601     Impression and Plan: Ms. Menchaca is a pleasant 36 yo white female with multiple health issues including iron deficiency anemia. She is on over 25 medications which could like contribute to malabsorption of iron in her diet. She is really quite miserable at this time and would like to have her Hickman catheter removed due to possible infection. She does have a low grade fever at 99.3 and HR is 124. She states that she has had some redness, tenderness and discharge at the site. I will send my note to Dr. Penny Pia in [place], [place] who she states ordered the line in the hopes that she can obtain an order to have this removed here in [place].  We will see what her iron studies show and bring her back in later this week for an infusion if needed.  We will continue to follow-up with her as needed.  She will contact us with any questions or concerns. We can certainly see her sooner if needed.   Verdie Mosher, NP 1/31/201812:33 PM

## 2017-01-31 ENCOUNTER — Other Ambulatory Visit: Payer: Self-pay | Admitting: Family

## 2017-01-31 LAB — IRON AND TIBC
%SAT: 14 % — ABNORMAL LOW (ref 21–57)
Iron: 54 ug/dL (ref 41–142)
TIBC: 374 ug/dL (ref 236–444)
UIBC: 320 ug/dL (ref 120–384)

## 2017-01-31 LAB — FERRITIN: Ferritin: 62 ng/ml (ref 9–269)

## 2017-01-31 LAB — RETICULOCYTES: Reticulocyte Count: 0.7 % (ref 0.6–2.6)

## 2017-01-31 LAB — VITAMIN B12: Vitamin B12: 282 pg/mL (ref 232–1245)

## 2017-02-01 ENCOUNTER — Ambulatory Visit (HOSPITAL_BASED_OUTPATIENT_CLINIC_OR_DEPARTMENT_OTHER): Payer: BLUE CROSS/BLUE SHIELD

## 2017-02-01 VITALS — BP 107/76 | HR 90 | Temp 98.4°F | Resp 18

## 2017-02-01 DIAGNOSIS — D518 Other vitamin B12 deficiency anemias: Secondary | ICD-10-CM | POA: Diagnosis not present

## 2017-02-01 DIAGNOSIS — D509 Iron deficiency anemia, unspecified: Secondary | ICD-10-CM | POA: Diagnosis not present

## 2017-02-01 MED ORDER — SODIUM CHLORIDE 0.9 % IV SOLN
Freq: Once | INTRAVENOUS | Status: AC
  Administered 2017-02-01: 15:00:00 via INTRAVENOUS

## 2017-02-01 MED ORDER — ACETAMINOPHEN 500 MG PO TABS
ORAL_TABLET | ORAL | Status: AC
  Filled 2017-02-01: qty 2

## 2017-02-01 MED ORDER — DIPHENHYDRAMINE HCL 25 MG PO CAPS
ORAL_CAPSULE | ORAL | Status: AC
  Filled 2017-02-01: qty 2

## 2017-02-01 MED ORDER — ACETAMINOPHEN 500 MG PO TABS
1000.0000 mg | ORAL_TABLET | Freq: Once | ORAL | Status: AC
  Administered 2017-02-01: 1000 mg via ORAL

## 2017-02-01 MED ORDER — HEPARIN SOD (PORK) LOCK FLUSH 100 UNIT/ML IV SOLN
250.0000 [IU] | Freq: Once | INTRAVENOUS | Status: DC | PRN
  Filled 2017-02-01: qty 5

## 2017-02-01 MED ORDER — IRON SUCROSE 20 MG/ML IV SOLN
200.0000 mg | Freq: Once | INTRAVENOUS | Status: AC
  Administered 2017-02-01: 200 mg via INTRAVENOUS
  Filled 2017-02-01: qty 10

## 2017-02-01 MED ORDER — DIPHENHYDRAMINE HCL 25 MG PO CAPS
50.0000 mg | ORAL_CAPSULE | Freq: Once | ORAL | Status: AC
  Administered 2017-02-01: 50 mg via ORAL

## 2017-02-01 MED ORDER — CYANOCOBALAMIN 1000 MCG/ML IJ SOLN
1000.0000 ug | Freq: Once | INTRAMUSCULAR | Status: AC
  Administered 2017-02-01: 1000 ug via INTRAMUSCULAR

## 2017-02-01 MED ORDER — CYANOCOBALAMIN 1000 MCG/ML IJ SOLN
INTRAMUSCULAR | Status: AC
  Filled 2017-02-01: qty 1

## 2017-02-01 MED ORDER — SODIUM CHLORIDE 0.9 % IJ SOLN
10.0000 mL | INTRAMUSCULAR | Status: DC | PRN
Start: 2017-02-01 — End: 2017-02-01
  Filled 2017-02-01: qty 10

## 2017-02-01 NOTE — Patient Instructions (Signed)
Cyanocobalamin, Vitamin B12 injection What is this medicine? CYANOCOBALAMIN (sye an oh koe BAL a min) is a man made form of vitamin B12. Vitamin B12 is used in the growth of healthy blood cells, nerve cells, and proteins in the body. It also helps with the metabolism of fats and carbohydrates. This medicine is used to treat people who can not absorb vitamin B12. This medicine may be used for other purposes; ask your health care provider or pharmacist if you have questions. COMMON BRAND NAME(S): B-12 Compliance Kit, B-12 Injection Kit, Cyomin, LA-12, Nutri-Twelve, Physicians EZ Use B-12, Primabalt What should I tell my health care provider before I take this medicine? They need to know if you have any of these conditions: -kidney disease -Leber's disease -megaloblastic anemia -an unusual or allergic reaction to cyanocobalamin, cobalt, other medicines, foods, dyes, or preservatives -pregnant or trying to get pregnant -breast-feeding How should I use this medicine? This medicine is injected into a muscle or deeply under the skin. It is usually given by a health care professional in a clinic or doctor's office. However, your doctor may teach you how to inject yourself. Follow all instructions. Talk to your pediatrician regarding the use of this medicine in children. Special care may be needed. Overdosage: If you think you have taken too much of this medicine contact a poison control center or emergency room at once. NOTE: This medicine is only for you. Do not share this medicine with others. What if I miss a dose? If you are given your dose at a clinic or doctor's office, call to reschedule your appointment. If you give your own injections and you miss a dose, take it as soon as you can. If it is almost time for your next dose, take only that dose. Do not take double or extra doses. What may interact with this medicine? -colchicine -heavy alcohol intake This list may not describe all possible  interactions. Give your health care provider a list of all the medicines, herbs, non-prescription drugs, or dietary supplements you use. Also tell them if you smoke, drink alcohol, or use illegal drugs. Some items may interact with your medicine. What should I watch for while using this medicine? Visit your doctor or health care professional regularly. You may need blood work done while you are taking this medicine. You may need to follow a special diet. Talk to your doctor. Limit your alcohol intake and avoid smoking to get the best benefit. What side effects may I notice from receiving this medicine? Side effects that you should report to your doctor or health care professional as soon as possible: -allergic reactions like skin rash, itching or hives, swelling of the face, lips, or tongue -blue tint to skin -chest tightness, pain -difficulty breathing, wheezing -dizziness -red, swollen painful area on the leg Side effects that usually do not require medical attention (report to your doctor or health care professional if they continue or are bothersome): -diarrhea -headache This list may not describe all possible side effects. Call your doctor for medical advice about side effects. You may report side effects to FDA at 1-800-FDA-1088. Where should I keep my medicine? Keep out of the reach of children. Store at room temperature between 15 and 30 degrees C (59 and 85 degrees F). Protect from light. Throw away any unused medicine after the expiration date. NOTE: This sheet is a summary. It may not cover all possible information. If you have questions about this medicine, talk to your doctor, pharmacist, or   health care provider.  2017 Elsevier/Gold Standard (2008-03-29 22:10:20) Iron Sucrose injection What is this medicine? IRON SUCROSE (AHY ern SOO krohs) is an iron complex. Iron is used to make healthy red blood cells, which carry oxygen and nutrients throughout the body. This medicine is used  to treat iron deficiency anemia in people with chronic kidney disease. This medicine may be used for other purposes; ask your health care provider or pharmacist if you have questions. COMMON BRAND NAME(S): Venofer What should I tell my health care provider before I take this medicine? They need to know if you have any of these conditions: -anemia not caused by low iron levels -heart disease -high levels of iron in the blood -kidney disease -liver disease -an unusual or allergic reaction to iron, other medicines, foods, dyes, or preservatives -pregnant or trying to get pregnant -breast-feeding How should I use this medicine? This medicine is for infusion into a vein. It is given by a health care professional in a hospital or clinic setting. Talk to your pediatrician regarding the use of this medicine in children. While this drug may be prescribed for children as young as 2 years for selected conditions, precautions do apply. Overdosage: If you think you have taken too much of this medicine contact a poison control center or emergency room at once. NOTE: This medicine is only for you. Do not share this medicine with others. What if I miss a dose? It is important not to miss your dose. Call your doctor or health care professional if you are unable to keep an appointment. What may interact with this medicine? Do not take this medicine with any of the following medications: -deferoxamine -dimercaprol -other iron products This medicine may also interact with the following medications: -chloramphenicol -deferasirox This list may not describe all possible interactions. Give your health care provider a list of all the medicines, herbs, non-prescription drugs, or dietary supplements you use. Also tell them if you smoke, drink alcohol, or use illegal drugs. Some items may interact with your medicine. What should I watch for while using this medicine? Visit your doctor or healthcare professional  regularly. Tell your doctor or healthcare professional if your symptoms do not start to get better or if they get worse. You may need blood work done while you are taking this medicine. You may need to follow a special diet. Talk to your doctor. Foods that contain iron include: whole grains/cereals, dried fruits, beans, or peas, leafy green vegetables, and organ meats (liver, kidney). What side effects may I notice from receiving this medicine? Side effects that you should report to your doctor or health care professional as soon as possible: -allergic reactions like skin rash, itching or hives, swelling of the face, lips, or tongue -breathing problems -changes in blood pressure -cough -fast, irregular heartbeat -feeling faint or lightheaded, falls -fever or chills -flushing, sweating, or hot feelings -joint or muscle aches/pains -seizures -swelling of the ankles or feet -unusually weak or tired Side effects that usually do not require medical attention (report to your doctor or health care professional if they continue or are bothersome): -diarrhea -feeling achy -headache -irritation at site where injected -nausea, vomiting -stomach upset -tiredness This list may not describe all possible side effects. Call your doctor for medical advice about side effects. You may report side effects to FDA at 1-800-FDA-1088. Where should I keep my medicine? This drug is given in a hospital or clinic and will not be stored at home. NOTE: This sheet is  a summary. It may not cover all possible information. If you have questions about this medicine, talk to your doctor, pharmacist, or health care provider.  2017 Elsevier/Gold Standard (2011-09-27 17:14:35)

## 2017-02-21 ENCOUNTER — Other Ambulatory Visit (HOSPITAL_COMMUNITY): Payer: Self-pay | Admitting: Infectious Diseases

## 2017-02-21 DIAGNOSIS — Z452 Encounter for adjustment and management of vascular access device: Secondary | ICD-10-CM

## 2017-03-01 ENCOUNTER — Ambulatory Visit (HOSPITAL_COMMUNITY)
Admission: RE | Admit: 2017-03-01 | Discharge: 2017-03-01 | Disposition: A | Discharge status: Home / Self Care | Payer: BLUE CROSS/BLUE SHIELD | Source: Ambulatory Visit | Attending: Infectious Diseases | Admitting: Infectious Diseases

## 2017-03-01 ENCOUNTER — Encounter (HOSPITAL_COMMUNITY): Payer: Self-pay | Admitting: Interventional Radiology

## 2017-03-01 DIAGNOSIS — Z452 Encounter for adjustment and management of vascular access device: Secondary | ICD-10-CM | POA: Insufficient documentation

## 2017-03-01 HISTORY — PX: IR GENERIC HISTORICAL: IMG1180011

## 2017-03-01 MED ORDER — LIDOCAINE HCL 1 % IJ SOLN
INTRAMUSCULAR | Status: DC | PRN
  Administered 2017-03-01: 5 mL

## 2017-03-01 MED ORDER — LIDOCAINE HCL 1 % IJ SOLN
INTRAMUSCULAR | Status: AC
  Filled 2017-03-01: qty 20

## 2017-03-23 ENCOUNTER — Emergency Department (HOSPITAL_COMMUNITY): Payer: BLUE CROSS/BLUE SHIELD

## 2017-03-23 ENCOUNTER — Emergency Department (HOSPITAL_COMMUNITY)
Admission: EM | Admit: 2017-03-23 | Discharge: 2017-03-24 | Disposition: A | Discharge status: Home / Self Care | Payer: BLUE CROSS/BLUE SHIELD | Attending: Emergency Medicine | Admitting: Emergency Medicine

## 2017-03-23 ENCOUNTER — Encounter (HOSPITAL_COMMUNITY): Payer: Self-pay | Admitting: Emergency Medicine

## 2017-03-23 DIAGNOSIS — S0993XA Unspecified injury of face, initial encounter: Secondary | ICD-10-CM | POA: Diagnosis present

## 2017-03-23 DIAGNOSIS — S00511A Abrasion of lip, initial encounter: Secondary | ICD-10-CM | POA: Insufficient documentation

## 2017-03-23 DIAGNOSIS — Y939 Activity, unspecified: Secondary | ICD-10-CM | POA: Insufficient documentation

## 2017-03-23 DIAGNOSIS — Y92009 Unspecified place in unspecified non-institutional (private) residence as the place of occurrence of the external cause: Secondary | ICD-10-CM | POA: Insufficient documentation

## 2017-03-23 DIAGNOSIS — G40909 Epilepsy, unspecified, not intractable, without status epilepticus: Secondary | ICD-10-CM | POA: Diagnosis not present

## 2017-03-23 DIAGNOSIS — J45909 Unspecified asthma, uncomplicated: Secondary | ICD-10-CM | POA: Diagnosis not present

## 2017-03-23 DIAGNOSIS — Z87891 Personal history of nicotine dependence: Secondary | ICD-10-CM | POA: Insufficient documentation

## 2017-03-23 DIAGNOSIS — R569 Unspecified convulsions: Secondary | ICD-10-CM

## 2017-03-23 DIAGNOSIS — Y999 Unspecified external cause status: Secondary | ICD-10-CM | POA: Diagnosis not present

## 2017-03-23 DIAGNOSIS — X58XXXA Exposure to other specified factors, initial encounter: Secondary | ICD-10-CM | POA: Insufficient documentation

## 2017-03-23 LAB — I-STAT BETA HCG BLOOD, ED (MC, WL, AP ONLY)

## 2017-03-23 LAB — BASIC METABOLIC PANEL
ANION GAP: 13 (ref 5–15)
BUN: 10 mg/dL (ref 6–20)
CHLORIDE: 102 mmol/L (ref 101–111)
CO2: 22 mmol/L (ref 22–32)
Calcium: 9.5 mg/dL (ref 8.9–10.3)
Creatinine, Ser: 0.88 mg/dL (ref 0.44–1.00)
GFR calc Af Amer: >60 mL/min (ref >60)
GFR calc non Af Amer: >60 mL/min (ref >60)
Glucose, Bld: 97 mg/dL (ref 65–99)
POTASSIUM: 4.5 mmol/L (ref 3.5–5.1)
Sodium: 137 mmol/L (ref 135–145)

## 2017-03-23 LAB — CBC
HEMATOCRIT: 41.7 % (ref 36.0–46.0)
HEMOGLOBIN: 13.7 g/dL (ref 12.0–15.0)
MCH: 29.2 pg (ref 26.0–34.0)
MCHC: 32.9 g/dL (ref 30.0–36.0)
MCV: 88.9 fL (ref 78.0–100.0)
Platelets: 329 10*3/uL (ref 150–400)
RBC: 4.69 MIL/uL (ref 3.87–5.11)
RDW: 12.6 % (ref 11.5–15.5)
WBC: 7.2 10*3/uL (ref 4.0–10.5)

## 2017-03-23 MED ORDER — PROMETHAZINE HCL 25 MG/ML IJ SOLN
25.0000 mg | Freq: Once | INTRAMUSCULAR | Status: AC
  Administered 2017-03-23: 25 mg via INTRAVENOUS
  Filled 2017-03-23: qty 1

## 2017-03-23 MED ORDER — SODIUM CHLORIDE 0.9 % IV BOLUS (SEPSIS)
1000.0000 mL | Freq: Once | INTRAVENOUS | Status: AC
  Administered 2017-03-23: 1000 mL via INTRAVENOUS

## 2017-03-23 MED ORDER — SODIUM CHLORIDE 0.9 % IV SOLN
1000.0000 mg | Freq: Once | INTRAVENOUS | Status: AC
  Administered 2017-03-24: 1000 mg via INTRAVENOUS
  Filled 2017-03-23: qty 10

## 2017-03-23 NOTE — ED Provider Notes (Signed)
[facility]-EMERGENCY DEPT Provider Note   CSN: [id number] Arrival date & time: 03/23/17  2258  By signing my name below, I, Nelwyn Salisbury, attest that this documentation has been prepared under the direction and in the presence of Charlynne Pander, MD . Electronically Signed: Nelwyn Salisbury, Scribe. 03/23/2017. 11:34 PM.  History   Chief Complaint Chief Complaint  Patient presents with  . Seizures   The history is provided by the patient and the EMS personnel. The history is limited by the condition of the patient. No language interpreter was used.    LEVEL 5 CAVEAT DUE TO PATIENT ACUITY OF CONDITION  HPI Comments:  Dana Lozano is a 36 y.o. female with pmhx of gastroparesis and seizures who presents to the Emergency Department by ambulance after experiencing a seizure at home earlier today. Pt called EMS after she may have had a nose bleed or coughed up some blood but she was not sure. She also felt dizzy at that time. Upon arrival, EMS noted blood to pt's mouth. EMS transported pt to the bottom level of her home when she entered a grand mal seizure that lasted about 1 minute. Patient used to have a port to get lactate ringers and IV zofran but the port removed several months ago. 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 She was seen in the ED a year ago and was prescribed keppra but didn't see a neurologist and has ran out of it for several months.   Past Medical History:  Diagnosis Date  . Anemia, iron deficiency 02/05/2013  . Asthma   . Bile reflux esophagitis   . Bile salt-induced diarrhea   . Chronic abdominal pain   . Fibromyalgia   . Gastroparesis   . GERD (gastroesophageal reflux disease)   . GERD (gastroesophageal reflux disease)   . IBS (irritable bowel syndrome)   . Interstitial cystitis   . Irritable bowel   . Kidney stones   . Lyme disease   . Migraine  headache   . Nausea vomiting and diarrhea    recurrent  . Seizure (HCC)   . Seizures [facility])     Patient Active Problem List   Diagnosis Date Noted  . Anemia, iron deficiency 02/05/2013  . NAUSEA AND VOMITING 09/25/2010  . DIARRHEA 09/25/2010  . LYME DISEASE 02/25/2008  . ANXIETY 02/25/2008  . DEPRESSION 02/25/2008  . ALLERGIC RHINITIS, SEASONAL 02/25/2008  . ASTHMA 02/25/2008  . GERD 02/25/2008  . ARTHRITIS 02/25/2008  . HEMORRHOIDS, EXTERNAL 08/13/2007  . ESOPHAGITIS 08/13/2007  . HYPERLIPIDEMIA 03/17/2007  . MIGRAINE HEADACHE 03/17/2007  . IRRITABLE BOWEL SYNDROME 03/17/2007  . INTERSTITIAL CYSTITIS 03/17/2007  . FIBROMYALGIA 03/17/2007  . NEPHROLITHIASIS, HX OF 03/17/2007    Past Surgical History:  Procedure Laterality Date  . bladder stretch    . CHOLECYSTECTOMY    . EXPLORATORY LAPAROTOMY    . IR GENERIC HISTORICAL  03/01/2017   IR REMOVAL TUN CV CATH W/O FL [facility]-INTERV RAD  . NASAL SINUS SURGERY    . PORT-A-CATH REMOVAL    . SINUSOTOMY      OB History    Gravida Para Term Preterm AB Living   0 0 0 0 0     SAB TAB Ectopic Multiple Live Births   0 0 0           Home Medications    Prior to Admission medications  Medication Sig Start Date End Date Taking? Authorizing Provider  buPROPion (WELLBUTRIN XL) 300 MG 24 hr tablet Take 300 mg by mouth daily.    Historical Provider, MD  cimetidine (TAGAMET) 200 MG tablet Take 200 mg by mouth 2 (two) times daily.    Historical Provider, MD  clonazePAM (KLONOPIN) 1 MG tablet Take 2 mg by mouth every evening.     Historical Provider, MD  colestipol (COLESTID) 1 g tablet Take 2 g by mouth 2 (two) times daily. 03/14/16   Historical Provider, MD  diazepam (VALIUM) 10 MG tablet  06/10/16   Historical Provider, MD  dicyclomine (BENTYL) 10 MG capsule Take 10 mg by mouth daily as needed for spasms.    Historical Provider, MD  diphenoxylate-atropine (LOMOTIL) 2.5-0.025 MG per tablet Take 2 tablets by mouth 4 (four) times daily as  needed (IBS).     Historical Provider, MD  dronabinol (MARINOL) 10 MG capsule Take 10 mg by mouth 3 (three) times daily as needed (nausea/bone pain).     Historical Provider, MD  esomeprazole (NEXIUM) 20 MG capsule Take 40 mg by mouth 2 (two) times daily before a meal.     Historical Provider, MD  HYDROcodone-acetaminophen (NORCO) 10-325 MG tablet Take 10-325 tablets by mouth every 8 (eight) hours as needed. 04/19/16   Historical Provider, MD  hydrocortisone (CORTEF) 5 MG tablet Take 5 mg by mouth daily.    Historical Provider, MD  hydrOXYzine (ATARAX/VISTARIL) 25 MG tablet Take 25 mg by mouth at bedtime.     Historical Provider, MD  L-Methylfolate (DEPLIN) 15 MG TABS Take 15 mg by mouth every evening.     Historical Provider, MD  lactated ringers Inject 1,000 mLs into the vein every other day.     Historical Provider, MD  lamoTRIgine (LAMICTAL) 100 MG tablet Take 200 mg by mouth 2 (two) times daily.    Historical Provider, MD  levETIRAcetam (KEPPRA) 500 MG tablet Take 1 tablet (500 mg total) by mouth 2 (two) times daily. 02/01/16   Doug Sou, MD  levonorgestrel-ethinyl estradiol (SEASONALE) 0.15-0.03 MG tablet Take 1 tablet by mouth every evening.     Historical Provider, MD  lidocaine (XYLOCAINE) 5 % ointment Apply 5 application topically every morning. 04/27/16   Historical Provider, MD  loratadine (CLARITIN) 10 MG tablet Take 10 mg by mouth every evening.    Historical Provider, MD  OXcarbazepine (TRILEPTAL) 150 MG tablet Take 300 mg by mouth 2 (two) times daily.     Historical Provider, MD  pentosan polysulfate (ELMIRON) 100 MG capsule Take 100 mg by mouth 3 (three) times daily.     Historical Provider, MD  pregabalin (LYRICA) 25 MG capsule Take 25 mg by mouth at bedtime.    Historical Provider, MD  PRESCRIPTION MEDICATION See admin instructions. IV Zofran: sent to patient from [employer]:  [id number], [place], [place] [address] [phone]    Historical Provider, MD    Probiotic Product (PROBIOTIC DAILY PO) Take 1 capsule by mouth at bedtime.     Historical Provider, MD  promethazine (PHENERGAN) 25 MG suppository Place 1 suppository (25 mg total) rectally every 6 (six) hours as needed for nausea or vomiting. 06/19/15   Ladona Mow, PA-C  QUEtiapine (SEROQUEL) 25 MG tablet Take 25 mg by mouth at bedtime. 12/26/15   Historical Provider, MD  scopolamine (TRANSDERM-SCOP) 1 MG/3DAYS Place onto the skin.    Historical Provider, MD  venlafaxine XR (EFFEXOR-XR) 75 MG 24 hr capsule Take  225 mg by mouth every morning.     Historical Provider, MD    Family History Family History  Problem Relation Age of Onset  . Hypertension Father   . Mental illness Father     depression    Social History Social History  Substance Use Topics  . Smoking status: Former Smoker    Quit date: 01/16/2003  . Smokeless tobacco: Not on file  . Alcohol use No     Allergies   Ibuprofen; Reglan [metoclopramide]; Reglan [metoclopramide]; Septra [sulfamethoxazole-trimethoprim]; Doxycycline; Ibuprofen; Lactose intolerance (gi); and Septra [sulfamethoxazole-trimethoprim]   Review of Systems Review of Systems  Unable to perform ROS: Acuity of condition     Physical Exam Updated Vital Signs BP 106/67 (BP Location: Left Arm)   Pulse 78   Temp 99.4 F (37.4 C) (Oral)   Resp 17   SpO2 100%   Physical Exam  Constitutional: She is oriented to person, place, and time. She appears well-developed and well-nourished. No distress.  HENT:  Head: Normocephalic and atraumatic.  Dried blood in left nostril, no active bleeding.   Eyes: Conjunctivae and EOM are normal.  Neck: Normal range of motion.  Cardiovascular: Normal rate, regular rhythm and normal heart sounds.   Pulmonary/Chest: Effort normal and breath sounds normal. She has no wheezes.  Abdominal: Soft. She exhibits no distension. There is no tenderness.  Musculoskeletal: Normal range of motion.  Neurological: She is alert and  oriented to person, place, and time.  Skin: Skin is warm and dry.  Small abrasion on upper lip   Psychiatric: She has a normal mood and affect. Judgment normal.  Nursing note and vitals reviewed.    ED Treatments / Results  DIAGNOSTIC STUDIES:  Oxygen Saturation is 98% on RA, normal by my interpretation.    COORDINATION OF CARE:  11:41 PM Discussed treatment plan with pt at bedside which includes Keppra, blood work, and imaging and pt agreed to plan.  Labs (all labs ordered are listed, but only abnormal results are displayed) Labs Reviewed  BASIC METABOLIC PANEL  CBC  I-STAT BETA HCG BLOOD, ED ([facility], [facility], [facility] ONLY)    EKG  EKG Interpretation None       Radiology Ct Head Wo Contrast  Result Date: 03/24/2017 CLINICAL DATA:  36 year old female with seizures. EXAM: CT HEAD WITHOUT CONTRAST TECHNIQUE: Contiguous axial images were obtained from the base of the skull through the vertex without intravenous contrast. COMPARISON:  Head CT dated 10/05/2014 FINDINGS: Brain: No evidence of acute infarction, hemorrhage, hydrocephalus, extra-axial collection or mass lesion/mass effect. Vascular: No hyperdense vessel or unexpected calcification. Skull: Choose Sinuses/Orbits: There is mild mucoperiosteal thickening of the paranasal sinuses with partial opacification of the left sphenoid sinus. No air-fluid levels. Other: None IMPRESSION: No acute intracranial pathology. Electronically Signed   By: Elgie Collard M.D.   On: 03/24/2017 00:16   Dg Chest Portable 1 View  Result Date: 03/24/2017 CLINICAL DATA:  Pt originally called 911 for coughing up blood, bloody nose, and felt like she was going to have a seizure. No blood on bed, and a little bit on the tissues. Pt has hx of seizures. Pt stated she felt dizzy. Blood noted to pt now. History of seizures. Feels dizzy. Grand mal seizure lasted 1 minutes. EXAM: PORTABLE CHEST 1 VIEW COMPARISON:  Chest x-ray 06/30/2015 FINDINGS: Heart size is normal.  There is mild elevation of right hemidiaphragm, stable over prior studies. No focal consolidations or pleural effusions are identified. No pulmonary edema. IMPRESSION: No  evidence for acute cardiopulmonary abnormality. Electronically Signed   By: Norva PavlovElizabeth  Brown M.D.   On: 03/24/2017 00:02    Procedures Procedures (including critical care time)  Medications Ordered in ED Medications  sodium chloride 0.9 % bolus 1,000 mL (0 mLs Intravenous Stopped 03/24/17 0217)  levETIRAcetam (KEPPRA) 1,000 mg in sodium chloride 0.9 % 100 mL IVPB (0 mg Intravenous Stopped 03/24/17 0217)  promethazine (PHENERGAN) injection 25 mg (25 mg Intravenous Given 03/23/17 2354)  ondansetron (ZOFRAN) injection 4 mg (4 mg Intravenous Given 03/24/17 0217)     Initial Impression / Assessment and Plan / ED Course  I have reviewed the triage vital signs and the nursing notes.  Pertinent labs & imaging results that were available during my care of the patient were reviewed by me and considered in my medical decision making (see chart for details).     Dana Lozano is a 36 y.o. female here presenting with possible seizure. Has dry blood in the mouth and small upper lip abrasion. No obvious epistaxis, lungs clear. I think the blood is likely from lip abrasion from the seizure. Had seizure a year ago but hasn't been taking her keppra. She states that she has chronic lyme and no neurologist will see her for that. Will get labs, CT head. Will load with IV keppra and reassess.   2:36 AM Labs unremarkable. CT head unremarkable. Hg stable. CXR clear. Given keppra now awake and alert. I encourage to take keppra as prescribed and see neurology to get outpatient EEG. No driving until cleared by neurologist.    Final Clinical Impressions(s) / ED Diagnoses   Final diagnoses:  None    New Prescriptions New Prescriptions   No medications on file  I personally performed the services described in this documentation, which was  scribed in my presence. The recorded information has been reviewed and is accurate.    Charlynne Panderavid Hsienta Yao, MD 03/24/17 [phone]

## 2017-03-23 NOTE — ED Triage Notes (Signed)
Per [facility]  Pt originally called 911 for coughing up blood, bloody nose, and felt like she was going to have a seizure. No blood on bed, and a little bit on the tissues. Pt has hx of seizures. Pt stated she felt dizzy. Blood noted to pt mouth. Originaly BP Palp at 108.EMS transported pt downstairs via Arts development officerstairchair and afeter transferring pt on stretcher she had a grand mal  Seizure that lasted approx 1 min. Pt was ST at 113 on the monitor. 18 LAC. CBG 115. Pt appreared to have another seizure, EMs completed the hand drop test and pt did not hit self in face and able to talk to them. Pt later had another grand mal seizure and EMS administer 2.5 versed.

## 2017-03-24 ENCOUNTER — Emergency Department (HOSPITAL_COMMUNITY): Payer: BLUE CROSS/BLUE SHIELD

## 2017-03-24 MED ORDER — LEVETIRACETAM 500 MG PO TABS: 500.0000 mg | ORAL_TABLET | Freq: Two times a day (BID) | ORAL | 0 refills | Status: DC

## 2017-03-24 MED ORDER — ONDANSETRON HCL 4 MG/2ML IJ SOLN
4.0000 mg | Freq: Once | INTRAMUSCULAR | Status: AC
  Administered 2017-03-24: 4 mg via INTRAVENOUS
  Filled 2017-03-24: qty 2

## 2017-03-24 NOTE — Discharge Instructions (Signed)
Take keppra 500 mg twice daily.   See neurologist for EEG.   Do not drive until you are cleared by neurologist  Return to ER if you have another seizure, vomiting, fevers, uncontrolled bleeding,.

## 2017-09-03 ENCOUNTER — Other Ambulatory Visit: Payer: Self-pay | Admitting: *Deleted

## 2017-09-03 DIAGNOSIS — D509 Iron deficiency anemia, unspecified: Secondary | ICD-10-CM

## 2017-09-04 ENCOUNTER — Other Ambulatory Visit (HOSPITAL_BASED_OUTPATIENT_CLINIC_OR_DEPARTMENT_OTHER): Payer: BLUE CROSS/BLUE SHIELD

## 2017-09-04 DIAGNOSIS — D509 Iron deficiency anemia, unspecified: Secondary | ICD-10-CM | POA: Diagnosis not present

## 2017-09-04 LAB — COMPREHENSIVE METABOLIC PANEL (CC13)
ALK PHOS: 111 IU/L (ref 39–117)
ALT: 13 IU/L (ref 0–32)
AST: 13 IU/L (ref 0–40)
Albumin, Serum: 4 g/dL (ref 3.5–5.5)
Albumin/Globulin Ratio: 1.3 (ref 1.2–2.2)
BUN/Creatinine Ratio: 14 (ref 9–23)
BUN: 12 mg/dL (ref 6–20)
CHLORIDE: 98 mmol/L (ref 96–106)
Calcium, Ser: 9.4 mg/dL (ref 8.7–10.2)
Carbon Dioxide, Total: 25 mmol/L (ref 20–29)
Creatinine, Ser: 0.84 mg/dL (ref 0.57–1.00)
GFR calc Af Amer: 104 mL/min/{1.73_m2} (ref >59)
GFR calc non Af Amer: 90 mL/min/{1.73_m2} (ref >59)
GLUCOSE: 95 mg/dL (ref 65–99)
Globulin, Total: 3.2 g/dL (ref 1.5–4.5)
Potassium, Ser: 4.3 mmol/L (ref 3.5–5.2)
Sodium: 137 mmol/L (ref 134–144)
TOTAL PROTEIN: 7.2 g/dL (ref 6.0–8.5)

## 2017-09-04 LAB — CBC WITH DIFFERENTIAL (CANCER CENTER ONLY)
BASO#: 0 10*3/uL (ref 0.0–0.2)
BASO%: 0.2 % (ref 0.0–2.0)
EOS ABS: 0.3 10*3/uL (ref 0.0–0.5)
EOS%: 2.8 % (ref 0.0–7.0)
HCT: 38.1 % (ref 34.8–46.6)
HGB: 12.9 g/dL (ref 11.6–15.9)
LYMPH#: 6.3 10*3/uL — ABNORMAL HIGH (ref 0.9–3.3)
LYMPH%: 59.9 % — AB (ref 14.0–48.0)
MCH: 30.6 pg (ref 26.0–34.0)
MCHC: 33.9 g/dL (ref 32.0–36.0)
MCV: 90 fL (ref 81–101)
MONO#: 0.7 10*3/uL (ref 0.1–0.9)
MONO%: 6.8 % (ref 0.0–13.0)
NEUT#: 3.2 10*3/uL (ref 1.5–6.5)
NEUT%: 30.3 % — ABNORMAL LOW (ref 39.6–80.0)
PLATELETS: 377 10*3/uL (ref 145–400)
RBC: 4.22 10*6/uL (ref 3.70–5.32)
RDW: 11.8 % (ref 11.1–15.7)
WBC: 10.5 10*3/uL — ABNORMAL HIGH (ref 3.9–10.0)

## 2017-09-05 LAB — IRON AND TIBC
%SAT: 14 % — AB (ref 21–57)
IRON: 62 ug/dL (ref 41–142)
TIBC: 425 ug/dL (ref 236–444)
UIBC: 364 ug/dL (ref 120–384)

## 2017-09-05 LAB — FERRITIN: Ferritin: 78 ng/ml (ref 9–269)

## 2017-09-06 ENCOUNTER — Telehealth: Payer: Self-pay | Admitting: *Deleted

## 2017-09-06 LAB — VITAMIN B12: VITAMIN B 12: 212 pg/mL — AB (ref 232–1245)

## 2017-09-06 NOTE — Telephone Encounter (Signed)
Reviewed results of recent labs. Patient needs one dose of venofer and vitamin B12.  Called and spoke to patient's mother. She is aware of results and appointment made.

## 2017-09-09 ENCOUNTER — Ambulatory Visit (HOSPITAL_BASED_OUTPATIENT_CLINIC_OR_DEPARTMENT_OTHER): Payer: BLUE CROSS/BLUE SHIELD

## 2017-09-09 VITALS — BP 114/72 | HR 100 | Temp 98.3°F | Resp 18

## 2017-09-09 DIAGNOSIS — D518 Other vitamin B12 deficiency anemias: Secondary | ICD-10-CM | POA: Diagnosis not present

## 2017-09-09 DIAGNOSIS — D509 Iron deficiency anemia, unspecified: Secondary | ICD-10-CM | POA: Diagnosis not present

## 2017-09-09 MED ORDER — CYANOCOBALAMIN 1000 MCG/ML IJ SOLN
INTRAMUSCULAR | Status: AC
  Filled 2017-09-09: qty 1

## 2017-09-09 MED ORDER — SODIUM CHLORIDE 0.9 % IV SOLN
200.0000 mg | Freq: Once | INTRAVENOUS | Status: AC
  Administered 2017-09-09: 200 mg via INTRAVENOUS
  Filled 2017-09-09: qty 10

## 2017-09-09 MED ORDER — CYANOCOBALAMIN 1000 MCG/ML IJ SOLN
1000.0000 ug | Freq: Once | INTRAMUSCULAR | Status: AC
  Administered 2017-09-09: 1000 ug via INTRAMUSCULAR

## 2017-09-09 MED ORDER — SODIUM CHLORIDE 0.9 % IV SOLN
Freq: Once | INTRAVENOUS | Status: AC
  Administered 2017-09-09: 12:00:00 via INTRAVENOUS

## 2017-09-09 NOTE — Patient Instructions (Signed)
Cyanocobalamin, Vitamin B12 injection What is this medicine? CYANOCOBALAMIN (sye an oh koe BAL a min) is a man made form of vitamin B12. Vitamin B12 is used in the growth of healthy blood cells, nerve cells, and proteins in the body. It also helps with the metabolism of fats and carbohydrates. This medicine is used to treat people who can not absorb vitamin B12. This medicine may be used for other purposes; ask your health care provider or pharmacist if you have questions. COMMON BRAND NAME(S): B-12 Compliance Kit, B-12 Injection Kit, Cyomin, LA-12, Nutri-Twelve, Physicians EZ Use B-12, Primabalt What should I tell my health care provider before I take this medicine? They need to know if you have any of these conditions: -kidney disease -Leber's disease -megaloblastic anemia -an unusual or allergic reaction to cyanocobalamin, cobalt, other medicines, foods, dyes, or preservatives -pregnant or trying to get pregnant -breast-feeding How should I use this medicine? This medicine is injected into a muscle or deeply under the skin. It is usually given by a health care professional in a clinic or doctor's office. However, your doctor may teach you how to inject yourself. Follow all instructions. Talk to your pediatrician regarding the use of this medicine in children. Special care may be needed. Overdosage: If you think you have taken too much of this medicine contact a poison control center or emergency room at once. NOTE: This medicine is only for you. Do not share this medicine with others. What if I miss a dose? If you are given your dose at a clinic or doctor's office, call to reschedule your appointment. If you give your own injections and you miss a dose, take it as soon as you can. If it is almost time for your next dose, take only that dose. Do not take double or extra doses. What may interact with this medicine? -colchicine -heavy alcohol intake This list may not describe all possible  interactions. Give your health care provider a list of all the medicines, herbs, non-prescription drugs, or dietary supplements you use. Also tell them if you smoke, drink alcohol, or use illegal drugs. Some items may interact with your medicine. What should I watch for while using this medicine? Visit your doctor or health care professional regularly. You may need blood work done while you are taking this medicine. You may need to follow a special diet. Talk to your doctor. Limit your alcohol intake and avoid smoking to get the best benefit. What side effects may I notice from receiving this medicine? Side effects that you should report to your doctor or health care professional as soon as possible: -allergic reactions like skin rash, itching or hives, swelling of the face, lips, or tongue -blue tint to skin -chest tightness, pain -difficulty breathing, wheezing -dizziness -red, swollen painful area on the leg Side effects that usually do not require medical attention (report to your doctor or health care professional if they continue or are bothersome): -diarrhea -headache This list may not describe all possible side effects. Call your doctor for medical advice about side effects. You may report side effects to FDA at 1-800-FDA-1088. Where should I keep my medicine? Keep out of the reach of children. Store at room temperature between 15 and 30 degrees C (59 and 85 degrees F). Protect from light. Throw away any unused medicine after the expiration date. NOTE: This sheet is a summary. It may not cover all possible information. If you have questions about this medicine, talk to your doctor, pharmacist, or   health care provider.  2018 Elsevier/Gold Standard (2008-03-29 22:10:20) Iron Sucrose injection What is this medicine? IRON SUCROSE (AHY ern SOO krohs) is an iron complex. Iron is used to make healthy red blood cells, which carry oxygen and nutrients throughout the body. This medicine is used  to treat iron deficiency anemia in people with chronic kidney disease. This medicine may be used for other purposes; ask your health care provider or pharmacist if you have questions. COMMON BRAND NAME(S): Venofer What should I tell my health care provider before I take this medicine? They need to know if you have any of these conditions: -anemia not caused by low iron levels -heart disease -high levels of iron in the blood -kidney disease -liver disease -an unusual or allergic reaction to iron, other medicines, foods, dyes, or preservatives -pregnant or trying to get pregnant -breast-feeding How should I use this medicine? This medicine is for infusion into a vein. It is given by a health care professional in a hospital or clinic setting. Talk to your pediatrician regarding the use of this medicine in children. While this drug may be prescribed for children as young as 2 years for selected conditions, precautions do apply. Overdosage: If you think you have taken too much of this medicine contact a poison control center or emergency room at once. NOTE: This medicine is only for you. Do not share this medicine with others. What if I miss a dose? It is important not to miss your dose. Call your doctor or health care professional if you are unable to keep an appointment. What may interact with this medicine? Do not take this medicine with any of the following medications: -deferoxamine -dimercaprol -other iron products This medicine may also interact with the following medications: -chloramphenicol -deferasirox This list may not describe all possible interactions. Give your health care provider a list of all the medicines, herbs, non-prescription drugs, or dietary supplements you use. Also tell them if you smoke, drink alcohol, or use illegal drugs. Some items may interact with your medicine. What should I watch for while using this medicine? Visit your doctor or healthcare professional  regularly. Tell your doctor or healthcare professional if your symptoms do not start to get better or if they get worse. You may need blood work done while you are taking this medicine. You may need to follow a special diet. Talk to your doctor. Foods that contain iron include: whole grains/cereals, dried fruits, beans, or peas, leafy green vegetables, and organ meats (liver, kidney). What side effects may I notice from receiving this medicine? Side effects that you should report to your doctor or health care professional as soon as possible: -allergic reactions like skin rash, itching or hives, swelling of the face, lips, or tongue -breathing problems -changes in blood pressure -cough -fast, irregular heartbeat -feeling faint or lightheaded, falls -fever or chills -flushing, sweating, or hot feelings -joint or muscle aches/pains -seizures -swelling of the ankles or feet -unusually weak or tired Side effects that usually do not require medical attention (report to your doctor or health care professional if they continue or are bothersome): -diarrhea -feeling achy -headache -irritation at site where injected -nausea, vomiting -stomach upset -tiredness This list may not describe all possible side effects. Call your doctor for medical advice about side effects. You may report side effects to FDA at 1-800-FDA-1088. Where should I keep my medicine? This drug is given in a hospital or clinic and will not be stored at home. NOTE: This sheet is  a summary. It may not cover all possible information. If you have questions about this medicine, talk to your doctor, pharmacist, or health care provider.  2018 Elsevier/Gold Standard (2011-09-27 17:14:35)

## 2017-10-02 ENCOUNTER — Other Ambulatory Visit: Payer: BLUE CROSS/BLUE SHIELD

## 2017-10-02 ENCOUNTER — Ambulatory Visit: Payer: BLUE CROSS/BLUE SHIELD | Admitting: Hematology & Oncology

## 2018-04-03 ENCOUNTER — Encounter (HOSPITAL_COMMUNITY): Payer: Self-pay

## 2018-04-03 ENCOUNTER — Emergency Department (HOSPITAL_COMMUNITY)
Admission: EM | Admit: 2018-04-03 | Discharge: 2018-04-03 | Disposition: A | Discharge status: Home / Self Care | Payer: BLUE CROSS/BLUE SHIELD | Attending: Emergency Medicine | Admitting: Emergency Medicine

## 2018-04-03 DIAGNOSIS — R569 Unspecified convulsions: Secondary | ICD-10-CM | POA: Diagnosis not present

## 2018-04-03 DIAGNOSIS — Z87891 Personal history of nicotine dependence: Secondary | ICD-10-CM | POA: Insufficient documentation

## 2018-04-03 DIAGNOSIS — Z79899 Other long term (current) drug therapy: Secondary | ICD-10-CM | POA: Diagnosis not present

## 2018-04-03 DIAGNOSIS — J45909 Unspecified asthma, uncomplicated: Secondary | ICD-10-CM | POA: Insufficient documentation

## 2018-04-03 DIAGNOSIS — R51 Headache: Secondary | ICD-10-CM | POA: Diagnosis not present

## 2018-04-03 DIAGNOSIS — R519 Headache, unspecified: Secondary | ICD-10-CM

## 2018-04-03 LAB — CBG MONITORING, ED: GLUCOSE-CAPILLARY: 111 mg/dL — AB (ref 65–99)

## 2018-04-03 MED ORDER — DIPHENHYDRAMINE HCL 50 MG/ML IJ SOLN
25.0000 mg | Freq: Once | INTRAMUSCULAR | Status: AC
  Administered 2018-04-03: 25 mg via INTRAVENOUS
  Filled 2018-04-03: qty 1

## 2018-04-03 MED ORDER — PROCHLORPERAZINE EDISYLATE 5 MG/ML IJ SOLN
10.0000 mg | Freq: Once | INTRAMUSCULAR | Status: AC
  Administered 2018-04-03: 10 mg via INTRAVENOUS
  Filled 2018-04-03: qty 2

## 2018-04-03 MED ORDER — SODIUM CHLORIDE 0.9 % IV BOLUS
1000.0000 mL | Freq: Once | INTRAVENOUS | Status: AC
  Administered 2018-04-03: 1000 mL via INTRAVENOUS

## 2018-04-03 MED ORDER — PROCHLORPERAZINE MALEATE 10 MG PO TABS: 10.0000 mg | ORAL_TABLET | Freq: Four times a day (QID) | ORAL | 0 refills | Status: DC | PRN

## 2018-04-03 MED ORDER — LORAZEPAM 2 MG/ML IJ SOLN
1.0000 mg | Freq: Once | INTRAMUSCULAR | Status: AC
Start: 2018-04-03 — End: 2018-04-03
  Administered 2018-04-03: 1 mg via INTRAVENOUS
  Filled 2018-04-03: qty 1

## 2018-04-03 NOTE — ED Notes (Signed)
Bed: RESB Expected date:  Expected time:  Means of arrival:  Comments: EMS- unresponsive/seizure

## 2018-04-03 NOTE — ED Triage Notes (Signed)
Pt arrived from home unresponsive due to having a witnessed seizure for roughly 10 min. Pt able to follow some commands. Currently on a non rebreather. Pt has hx of seizures and substance abuse per EMS.

## 2018-04-03 NOTE — ED Provider Notes (Signed)
[facility]-EMERGENCY DEPT Provider Note   CSN: [id number] Arrival date & time: 04/03/18  [id number]     History   Chief Complaint Chief Complaint  Patient presents with  . Seizures    HPI Dana Lozano is a 37 y.o. female.  The history is provided by the EMS personnel. The history is limited by the Lozano of the patient (Unresponsive).  She has history of seizures, Lyme disease, GERD and is brought in by ambulance after having a seizure at home.  EMS reports seizure activity lasting approximately 8 minutes, but resolving before they were able to give her any lorazepam.  There was no incontinence and no bit lip or tongue.  Past Medical History:  Diagnosis Date  . Anemia, iron deficiency 02/05/2013  . Asthma   . Bile reflux esophagitis   . Bile salt-induced diarrhea   . Chronic abdominal pain   . Fibromyalgia   . Gastroparesis   . GERD (gastroesophageal reflux disease)   . GERD (gastroesophageal reflux disease)   . IBS (irritable bowel syndrome)   . Interstitial cystitis   . Irritable bowel   . Kidney stones   . Lyme disease   . Migraine headache   . Nausea vomiting and diarrhea    recurrent  . Seizure (HCC)   . Seizures [facility](HCC)     Patient Active Problem List   Diagnosis Date Noted  . Anemia, iron deficiency 02/05/2013  . NAUSEA AND VOMITING 09/25/2010  . DIARRHEA 09/25/2010  . LYME DISEASE 02/25/2008  . ANXIETY 02/25/2008  . DEPRESSION 02/25/2008  . ALLERGIC RHINITIS, SEASONAL 02/25/2008  . ASTHMA 02/25/2008  . GERD 02/25/2008  . ARTHRITIS 02/25/2008  . HEMORRHOIDS, EXTERNAL 08/13/2007  . ESOPHAGITIS 08/13/2007  . HYPERLIPIDEMIA 03/17/2007  . MIGRAINE HEADACHE 03/17/2007  . IRRITABLE BOWEL SYNDROME 03/17/2007  . INTERSTITIAL CYSTITIS 03/17/2007  . FIBROMYALGIA 03/17/2007  . NEPHROLITHIASIS, HX OF 03/17/2007    Past Surgical History:  Procedure Laterality Date  . bladder stretch    . CHOLECYSTECTOMY    . EXPLORATORY LAPAROTOMY     . IR GENERIC HISTORICAL  03/01/2017   IR REMOVAL TUN CV CATH W/O FL [facility]-INTERV RAD  . NASAL SINUS SURGERY    . PORT-A-CATH REMOVAL    . SINUSOTOMY       OB History    Gravida  0   Para  0   Term  0   Preterm  0   AB  0   Living        SAB  0   TAB  0   Ectopic  0   Multiple      Live Births               Home Medications    Prior to Admission medications   Medication Sig Start Date End Date Taking? Authorizing Provider  buPROPion (WELLBUTRIN XL) 300 MG 24 hr tablet Take 300 mg by mouth daily.    [provider]  cimetidine (TAGAMET) 200 MG tablet Take 200 mg by mouth 2 (two) times daily.    [provider]  clonazePAM (KLONOPIN) 1 MG tablet Take 2 mg by mouth every evening.     [provider]  colestipol (COLESTID) 1 g tablet Take 2 g by mouth 2 (two) times daily. 03/14/16   [provider]  diazepam (VALIUM) 10 MG tablet  06/10/16   [provider]  dicyclomine (BENTYL) 10 MG capsule Take 10 mg by mouth daily as needed  for spasms.    [provider]  diphenoxylate-atropine (LOMOTIL) 2.5-0.025 MG per tablet Take 2 tablets by mouth 4 (four) times daily as needed (IBS).     [provider]  dronabinol (MARINOL) 10 MG capsule Take 10 mg by mouth 3 (three) times daily as needed (nausea/bone pain).     [provider]  esomeprazole (NEXIUM) 20 MG capsule Take 40 mg by mouth 2 (two) times daily before a meal.     [provider]  HYDROcodone-acetaminophen (NORCO) 10-325 MG tablet Take 10-325 tablets by mouth every 8 (eight) hours as needed. 04/19/16   [provider]  hydrocortisone (CORTEF) 5 MG tablet Take 5 mg by mouth daily.    [provider]  hydrOXYzine (ATARAX/VISTARIL) 25 MG tablet Take 25 mg by mouth at bedtime.     [provider]  L-Methylfolate (DEPLIN) 15 MG TABS Take 15 mg by mouth every evening.     [provider]  lactated ringers Inject  1,000 mLs into the vein every other day.     [provider]  lamoTRIgine (LAMICTAL) 100 MG tablet Take 200 mg by mouth 2 (two) times daily.    [provider]  levETIRAcetam (KEPPRA) 500 MG tablet Take 1 tablet (500 mg total) by mouth 2 (two) times daily. 02/01/16   Doug Sou, MD  levETIRAcetam (KEPPRA) 500 MG tablet Take 1 tablet (500 mg total) by mouth 2 (two) times daily. 03/24/17   Charlynne Pander, MD  levonorgestrel-ethinyl estradiol (SEASONALE) 0.15-0.03 MG tablet Take 1 tablet by mouth every evening.     [provider]  lidocaine (XYLOCAINE) 5 % ointment Apply 5 application topically every morning. 04/27/16   [provider]  loratadine (CLARITIN) 10 MG tablet Take 10 mg by mouth every evening.    [provider]  OXcarbazepine (TRILEPTAL) 150 MG tablet Take 300 mg by mouth 2 (two) times daily.     [provider]  pentosan polysulfate (ELMIRON) 100 MG capsule Take 100 mg by mouth 3 (three) times daily.     [provider]  pregabalin (LYRICA) 25 MG capsule Take 25 mg by mouth at bedtime.    [provider]  PRESCRIPTION MEDICATION See admin instructions. IV Zofran: sent to patient from [employer]:  [id number], [place], [place] [address] [phone]    [provider]  Probiotic Product (PROBIOTIC DAILY PO) Take 1 capsule by mouth at bedtime.     [provider]  promethazine (PHENERGAN) 25 MG suppository Place 1 suppository (25 mg total) rectally every 6 (six) hours as needed for nausea or vomiting. 06/19/15   Ladona Mow, PA-C  QUEtiapine (SEROQUEL) 25 MG tablet Take 25 mg by mouth at bedtime. 12/26/15   [provider]  scopolamine (TRANSDERM-SCOP) 1 MG/3DAYS Place onto the skin.    [provider]  venlafaxine XR (EFFEXOR-XR) 75 MG 24 hr capsule Take 225 mg by mouth every morning.     [provider]    Family History Family History  Problem  Relation Age of Onset  . Hypertension Father   . Mental illness Father        depression    Social History Social History   Tobacco Use  . Smoking status: Former Smoker    Last attempt to quit: 01/16/2003    Years since quitting: 15.2  . Smokeless tobacco: Never Used  Substance Use Topics  . Alcohol use: No    Alcohol/week:  0.0 oz  . Drug use: No     Allergies   Ibuprofen; Reglan [metoclopramide]; Reglan [metoclopramide]; Septra [sulfamethoxazole-trimethoprim]; Doxycycline; Ibuprofen; Lactose intolerance (gi); and Septra [sulfamethoxazole-trimethoprim]   Review of Systems Review of Systems  Unable to perform ROS: Patient unresponsive     Physical Exam Updated Vital Signs BP 139/89 (BP Location: Left Arm)   Pulse (!) 115   Temp 98.3 F (36.8 C) (Oral)   Resp 20   SpO2 100%   Physical Exam  Nursing note and vitals reviewed.  37 year old female, resting comfortably and in no acute distress. Vital signs are significant for elevated heart rate. Oxygen saturation is 100%, which is normal. Head is normocephalic and atraumatic. PERRLA, EOMI. Oropharynx is clear. Neck is nontender and supple without adenopathy or JVD. Back is nontender and there is no CVA tenderness. Lungs are clear without rales, wheezes, or rhonchi. Chest is nontender. Heart has regular rate and rhythm without murmur. Abdomen is soft, flat, nontender without masses or hepatosplenomegaly and peristalsis is normoactive. Extremities have no cyanosis or edema, full range of motion is present. Skin is warm and dry without rash. Neurologic: She responds to painful stimuli but not verbal stimuli, she is nonverbal and does not follow commands, cranial nerves are intact, there are no gross motor or sensory deficits.  Of note, her muscles are mostly flaccid at rest, but, when raised above her face, she will not allow her dropped hand to hit her face.  ED Treatments / Results  Labs (all labs ordered are listed,  but only abnormal results are displayed) Labs Reviewed  CBG MONITORING, ED - Abnormal; Notable for the following components:      Result Value   Glucose-Capillary 111 (*)    All other components within normal limits   Procedures Procedures (including critical care time)  Medications Ordered in ED Medications  sodium chloride 0.9 % bolus 1,000 mL (1,000 mLs Intravenous New Bag/Given 04/03/18 0703)  diphenhydrAMINE (BENADRYL) injection 25 mg (25 mg Intravenous Given 04/03/18 0658)  prochlorperazine (COMPAZINE) injection 10 mg (10 mg Intravenous Given 04/03/18 0700)  LORazepam (ATIVAN) injection 1 mg (1 mg Intravenous Given 04/03/18 0702)     Initial Impression / Assessment and Plan / ED Course  I have reviewed the triage vital signs and the nursing notes.  Pertinent lab results that were available during my care of the patient were reviewed by me and considered in my medical decision making (see chart for details).  Seizure versus pseudoseizure.  Old records are reviewed, and she does have prior ED visits for seizures.  She had been given prescription for levetiracetam after ED visit in December 2016, but seen again for seizure in March 2018 and had apparently stopped taking the levetiracetam.  Given a new prescription on that date, but it is uncertain if she is continuing to take it or not.  6:06 AM Patient is starting to wake up.  She states that she has seen a neurologist.  Unable to take levetiracetam because it makes her sick.  At this point, patient will be observed for return to baseline.  No indication for laboratory testing or advanced imaging.  6:32 AM Patient is now more awake.  She relates that she was recently put on transdermal buprenorphine and has been having severe headaches and nausea since starting it.  She has been unable to take her diazepam which normally helps prevent her seizures.  She continues to complain of severe headache which she states is like a  migraine.  We will  give normal saline, prochlorperazine, diphenhydramine and also give dose of lorazepam.  7:58 AM Headache is only modestly improved, but she is now much more awake and talking normally.  She feels comfortable going home.  She is given a prescription for prochlorperazine for nausea and headache at home, and she is to contact her chronic pain doctor regarding the transdermal buprenorphine.  Final Clinical Impressions(s) / ED Diagnoses   Final diagnoses:  Seizure (HCC)  Bad headache    ED Discharge Orders        Ordered    prochlorperazine (COMPAZINE) 10 MG tablet  Every 6 hours PRN     04/03/18 0757       Dione Booze, MD 04/03/18 0800

## 2018-04-03 NOTE — Discharge Instructions (Addendum)
Talk with your pain doctor about the [place].

## 2018-04-03 NOTE — ED Triage Notes (Signed)
Pt had two witnessed seizures one in front of father and the other in front of EMS Pt was unresponsive for EMS

## 2018-08-09 ENCOUNTER — Encounter (HOSPITAL_COMMUNITY): Payer: Self-pay | Admitting: *Deleted

## 2018-08-09 ENCOUNTER — Other Ambulatory Visit: Payer: Self-pay

## 2018-08-09 ENCOUNTER — Emergency Department (HOSPITAL_COMMUNITY)
Admission: EM | Admit: 2018-08-09 | Discharge: 2018-08-09 | Disposition: A | Discharge status: Home / Self Care | Payer: BLUE CROSS/BLUE SHIELD | Attending: Emergency Medicine | Admitting: Emergency Medicine

## 2018-08-09 DIAGNOSIS — M797 Fibromyalgia: Secondary | ICD-10-CM | POA: Insufficient documentation

## 2018-08-09 DIAGNOSIS — R6889 Other general symptoms and signs: Secondary | ICD-10-CM

## 2018-08-09 DIAGNOSIS — Z87891 Personal history of nicotine dependence: Secondary | ICD-10-CM | POA: Insufficient documentation

## 2018-08-09 DIAGNOSIS — R05 Cough: Secondary | ICD-10-CM | POA: Diagnosis present

## 2018-08-09 DIAGNOSIS — A6920 Lyme disease, unspecified: Secondary | ICD-10-CM | POA: Diagnosis not present

## 2018-08-09 DIAGNOSIS — R0981 Nasal congestion: Secondary | ICD-10-CM | POA: Diagnosis not present

## 2018-08-09 DIAGNOSIS — Z79899 Other long term (current) drug therapy: Secondary | ICD-10-CM | POA: Diagnosis not present

## 2018-08-09 DIAGNOSIS — J45909 Unspecified asthma, uncomplicated: Secondary | ICD-10-CM | POA: Diagnosis not present

## 2018-08-09 LAB — GROUP A STREP BY PCR: Group A Strep by PCR: NOT DETECTED

## 2018-08-09 MED ORDER — GUAIFENESIN ER 600 MG PO TB12: 600.0000 mg | ORAL_TABLET | Freq: Two times a day (BID) | ORAL | 0 refills | Status: AC

## 2018-08-09 NOTE — Discharge Instructions (Addendum)
Your strep test was negative. For the congestion, I have prescribed you a decongestant, Mucinex. This should help break it up and make it easier for you to cough up.  If you continue to have problems after 4-5 days, please follow-up with your PCP for further evaluation.  Return to the ED if the following develops: fever, neck pain/stiffness, rash, difficulty breathing.

## 2018-08-09 NOTE — ED Triage Notes (Signed)
Pt reports coughing today and mucous build up in her throat she is not able to cough up. Lungs clear bilaterally.

## 2018-08-09 NOTE — ED Provider Notes (Signed)
[facility]-EMERGENCY DEPT Provider Note  CSN: [id number] Arrival date & time: 08/09/18  1925    History   Chief Complaint Chief Complaint  Patient presents with  . Cough    HPI Dana Lozano is a 37 y.o. female with a medical history of Lyme, asthma, fibromylagia and seizures who presented to the ED for congestion x1 day. She reports feeling like there is mucus in the back of her throat that she is not able to cough up. This began this morning and she reports coughing most of the day trying to get the congestion to clear. Denies fever, rhinorrhea, postnasal drip, sore throat, neck pain, drooling or difficulty breathing. Denies wheezes, chest pain or SOB. Denies recent travel, new exposures or sick contacts. Patient has tried nothing prior to coming to the ED.   Past Medical History:  Diagnosis Date  . Anemia, iron deficiency 02/05/2013  . Asthma   . Bile reflux esophagitis   . Bile salt-induced diarrhea   . Chronic abdominal pain   . Fibromyalgia   . Gastroparesis   . GERD (gastroesophageal reflux disease)   . GERD (gastroesophageal reflux disease)   . IBS (irritable bowel syndrome)   . Interstitial cystitis   . Irritable bowel   . Kidney stones   . Lyme disease   . Migraine headache   . Nausea vomiting and diarrhea    recurrent  . Seizure (HCC)   . Seizures [facility](HCC)     Patient Active Problem List   Diagnosis Date Noted  . Anemia, iron deficiency 02/05/2013  . NAUSEA AND VOMITING 09/25/2010  . DIARRHEA 09/25/2010  . LYME DISEASE 02/25/2008  . ANXIETY 02/25/2008  . DEPRESSION 02/25/2008  . ALLERGIC RHINITIS, SEASONAL 02/25/2008  . ASTHMA 02/25/2008  . GERD 02/25/2008  . ARTHRITIS 02/25/2008  . HEMORRHOIDS, EXTERNAL 08/13/2007  . ESOPHAGITIS 08/13/2007  . HYPERLIPIDEMIA 03/17/2007  . MIGRAINE HEADACHE 03/17/2007  . IRRITABLE BOWEL SYNDROME 03/17/2007  . INTERSTITIAL CYSTITIS 03/17/2007  . FIBROMYALGIA 03/17/2007  . NEPHROLITHIASIS, HX  OF 03/17/2007    Past Surgical History:  Procedure Laterality Date  . bladder stretch    . CHOLECYSTECTOMY    . EXPLORATORY LAPAROTOMY    . IR GENERIC HISTORICAL  03/01/2017   IR REMOVAL TUN CV CATH W/O FL [facility]-INTERV RAD  . NASAL SINUS SURGERY    . PORT-A-CATH REMOVAL    . SINUSOTOMY       OB History    Gravida  0   Para  0   Term  0   Preterm  0   AB  0   Living        SAB  0   TAB  0   Ectopic  0   Multiple      Live Births               Home Medications    Prior to Admission medications   Medication Sig Start Date End Date Taking? Authorizing Provider  buprenorphine (BUTRANS) 10 MCG/HR PTWK patch Place 10 mcg onto the skin once a week.    [provider]  buPROPion (WELLBUTRIN XL) 300 MG 24 hr tablet Take 300 mg by mouth daily.    [provider]  cetirizine (ZYRTEC) 10 MG tablet Take 10 mg by mouth daily.    [provider]  cimetidine (TAGAMET) 200 MG tablet Take 200 mg by mouth at bedtime.     [provider]  diazepam (VALIUM) 10 MG tablet  10 mg every 8 (eight) hours as needed for anxiety.  06/10/16   [provider]  dicyclomine (BENTYL) 10 MG capsule Take 10 mg by mouth daily as needed for spasms.    [provider]  diphenoxylate-atropine (LOMOTIL) 2.5-0.025 MG per tablet Take 2 tablets by mouth 4 (four) times daily as needed (IBS).     [provider]  dronabinol (MARINOL) 10 MG capsule Take 10 mg by mouth 3 (three) times daily as needed (nausea/bone pain).     [provider]  esomeprazole (NEXIUM) 20 MG capsule Take 40 mg by mouth 2 (two) times daily before a meal.     [provider]  guaiFENesin (MUCINEX) 600 MG 12 hr tablet Take 1 tablet (600 mg total) by mouth 2 (two) times daily for 7 days. 08/09/18 08/16/18  Addilyne Backs, Jerrel Ivory I, PA-C  hydrOXYzine (ATARAX/VISTARIL) 25 MG tablet Take 25 mg by mouth at bedtime.     [provider]  lamoTRIgine (LAMICTAL) 100  MG tablet Take 200 mg by mouth 3 (three) times daily.     [provider]  levonorgestrel-ethinyl estradiol (SEASONALE) 0.15-0.03 MG tablet Take 1 tablet by mouth every evening.     [provider]  lidocaine (XYLOCAINE) 5 % ointment Apply 5 application topically every morning. 04/27/16   [provider]  OXcarbazepine (TRILEPTAL) 300 MG/5ML suspension Take 175 mg by mouth 3 (three) times daily.    [provider]  pentosan polysulfate (ELMIRON) 100 MG capsule Take 100 mg by mouth 3 (three) times daily.     [provider]  pregabalin (LYRICA) 25 MG capsule Take 25 mg by mouth every evening.     [provider]  PRESCRIPTION MEDICATION See admin instructions. IV promethazine: sent to patient from [facility]:  [id number], [place], [place] [address] [phone]    [provider]  Probiotic Product (PROBIOTIC DAILY PO) Take 1 capsule by mouth at bedtime.     [provider]  prochlorperazine (COMPAZINE) 10 MG tablet Take 1 tablet (10 mg total) by mouth every 6 (six) hours as needed for nausea or vomiting (or headache). 04/03/18   Dione Booze, MD  QUEtiapine (SEROQUEL) 25 MG tablet Take 50 mg by mouth at bedtime.  12/26/15   [provider]  venlafaxine XR (EFFEXOR-XR) 75 MG 24 hr capsule Take 225 mg by mouth every morning.     [provider]    Family History Family History  Problem Relation Age of Onset  . Hypertension Father   . Mental illness Father        depression    Social History Social History   Tobacco Use  . Smoking status: Former Smoker    Last attempt to quit: 01/16/2003    Years since quitting: 15.5  . Smokeless tobacco: Never Used  Substance Use Topics  . Alcohol use: No    Alcohol/week: 0.0 standard drinks  . Drug use: No     Allergies   Ibuprofen; Reglan [metoclopramide]; Reglan [metoclopramide]; Septra [sulfamethoxazole-trimethoprim]; Doxycycline;  Ibuprofen; Lactose intolerance (gi); and Septra [sulfamethoxazole-trimethoprim]   Review of Systems Review of Systems  Constitutional: Negative for chills and fever.  HENT: Positive for congestion. Negative for drooling, ear pain, postnasal drip, rhinorrhea, sinus pressure, sinus pain, sneezing, sore throat, trouble swallowing and voice change.   Respiratory: Positive for cough. Negative for choking, chest tightness, shortness of breath and wheezing.   Cardiovascular: Negative for chest pain and palpitations.  Musculoskeletal:  Negative.   Skin: Negative.   Neurological: Negative.      Physical Exam Updated Vital Signs BP 132/88 (BP Location: Left Arm)   Pulse (!) 115   Temp 98.7 F (37.1 C) (Oral)   Resp 18   Ht 5\' 7"  (1.702 m)   Wt 88.5 kg   SpO2 100%   BMI 30.54 kg/m   Physical Exam  Constitutional: She appears well-developed and well-nourished. No distress.  Patient able to speak in complete sentences.  HENT:  Head: Normocephalic and atraumatic.  Right Ear: Tympanic membrane, external ear and ear canal normal.  Left Ear: Tympanic membrane, external ear and ear canal normal.  Nose: Nose normal.  Mouth/Throat: Uvula is midline and mucous membranes are normal. No trismus in the jaw. No uvula swelling. No oropharyngeal exudate or tonsillar abscesses. Tonsils are 2+ on the right. No tonsillar exudate.  Posterior pharynx erythematous with right tonsillar edema. Clear secretions seen pooled in posterior pharynx.  Eyes: Pupils are equal, round, and reactive to light. Conjunctivae, EOM and lids are normal.  Neck: Trachea normal, normal range of motion, full passive range of motion without pain and phonation normal. Neck supple. No neck rigidity. Normal range of motion present.  Cardiovascular: Normal rate, regular rhythm and normal heart sounds.  Pulmonary/Chest: Effort normal and breath sounds normal. No tachypnea. She has no decreased breath sounds. She has no wheezes. She has no  rhonchi. She has no rales.  Skin: Skin is warm and intact. Capillary refill takes less than 2 seconds. She is not diaphoretic. No cyanosis. No pallor.  Nursing note and vitals reviewed.    ED Treatments / Results  Labs (all labs ordered are listed, but only abnormal results are displayed) Labs Reviewed  GROUP A STREP BY PCR    EKG None  Radiology No results found.  Procedures Procedures (including critical care time)  Medications Ordered in ED Medications - No data to display   Initial Impression / Assessment and Plan / ED Course  Triage vital signs and the nursing notes have been reviewed.  Pertinent labs & imaging results that were available during care of the patient were reviewed and considered in medical decision making (see chart for details).  Patient presents with excessive saliva production. On exam, her posterior pharynx is erythematous with right tonsillar edema. Clear secretions are pooled in back of patient's throat, but it does not interfere with patient's phonation or breathing. Patient is able to breath normally and is not in respiratory distress. Although patient currently does not have any other upper respiratory complaints, there is concern for strep throat given physical exam. Will order strep test for further evaluation. Clinical Course as of Aug 09 2110  Sat Aug 09, 2018  2107 Strep test negative   [GM]  2110 Suctioning completed in the ED which provided some relief.   [GM]    Clinical Course User Index [GM] Arayla Kruschke, Jerrel Ivory I, PA-C   Unclear cause of excessive secretions. Given physical exam findings, it is possibly viral pharyngitis. Will prescribe decongestant to assist with thinning secretions to make it more manageable for patient to pass them on her own. It is reassuring that she has remaining hemodynamically stable and the respiratory state has remain normal and unchanged.  Final Clinical Impressions(s) / ED Diagnoses  1. Congestion. Mucinex  prescribed. Possible viral pharyngitis given physical exam. Education provided on s/s of respiratory decline that warrant immediate return to the ED. Education also provided on OTC and supportive treatment for pharyngitis.  Dispo: Home. After thorough clinical evaluation, this patient is determined to be medically stable and can be safely discharged with the previously mentioned treatment and/or outpatient follow-up/referral(s). At this time, there are no other apparent medical conditions that require further screening, evaluation or treatment.   Final diagnoses:  Congestion of throat    ED Discharge Orders         Ordered    guaiFENesin (MUCINEX) 600 MG 12 hr tablet  2 times daily     08/09/18 2105            Reva Bores 08/09/18 2116    Pricilla Loveless, MD 08/09/18 2350

## 2019-01-26 ENCOUNTER — Ambulatory Visit: Payer: Self-pay | Admitting: Surgery

## 2021-02-15 ENCOUNTER — Emergency Department (HOSPITAL_COMMUNITY)
Admission: EM | Admit: 2021-02-15 | Discharge: 2021-02-15 | Disposition: A | Discharge status: Home / Self Care | Payer: BC Managed Care – PPO | Attending: Emergency Medicine | Admitting: Emergency Medicine

## 2021-02-15 ENCOUNTER — Encounter (HOSPITAL_COMMUNITY): Payer: Self-pay

## 2021-02-15 ENCOUNTER — Other Ambulatory Visit: Payer: Self-pay

## 2021-02-15 ENCOUNTER — Emergency Department (HOSPITAL_COMMUNITY): Payer: BC Managed Care – PPO

## 2021-02-15 DIAGNOSIS — R569 Unspecified convulsions: Secondary | ICD-10-CM | POA: Diagnosis not present

## 2021-02-15 DIAGNOSIS — J45909 Unspecified asthma, uncomplicated: Secondary | ICD-10-CM | POA: Insufficient documentation

## 2021-02-15 DIAGNOSIS — Z87891 Personal history of nicotine dependence: Secondary | ICD-10-CM | POA: Diagnosis not present

## 2021-02-15 DIAGNOSIS — S0990XA Unspecified injury of head, initial encounter: Secondary | ICD-10-CM | POA: Diagnosis present

## 2021-02-15 DIAGNOSIS — X58XXXA Exposure to other specified factors, initial encounter: Secondary | ICD-10-CM | POA: Diagnosis not present

## 2021-02-15 DIAGNOSIS — S0081XA Abrasion of other part of head, initial encounter: Secondary | ICD-10-CM | POA: Insufficient documentation

## 2021-02-15 LAB — COMPREHENSIVE METABOLIC PANEL
ALT: 22 U/L (ref 0–44)
AST: 23 U/L (ref 15–41)
Albumin: 3.7 g/dL (ref 3.5–5.0)
Alkaline Phosphatase: 92 U/L (ref 38–126)
Anion gap: 12 (ref 5–15)
BUN: 7 mg/dL (ref 6–20)
CO2: 23 mmol/L (ref 22–32)
Calcium: 9.1 mg/dL (ref 8.9–10.3)
Chloride: 106 mmol/L (ref 98–111)
Creatinine, Ser: 0.73 mg/dL (ref 0.44–1.00)
GFR, Estimated: >60 mL/min (ref >60)
Glucose, Bld: 90 mg/dL (ref 70–99)
Potassium: 4.2 mmol/L (ref 3.5–5.1)
Sodium: 141 mmol/L (ref 135–145)
Total Bilirubin: 0.6 mg/dL (ref 0.3–1.2)
Total Protein: 6.9 g/dL (ref 6.5–8.1)

## 2021-02-15 LAB — CBC WITH DIFFERENTIAL/PLATELET
Abs Immature Granulocytes: 0.02 10*3/uL (ref 0.00–0.07)
Basophils Absolute: 0.1 10*3/uL (ref 0.0–0.1)
Basophils Relative: 1 %
Eosinophils Absolute: 0.3 10*3/uL (ref 0.0–0.5)
Eosinophils Relative: 3 %
HCT: 39.5 % (ref 36.0–46.0)
Hemoglobin: 12.2 g/dL (ref 12.0–15.0)
Immature Granulocytes: 0 %
Lymphocytes Relative: 61 %
Lymphs Abs: 5.1 10*3/uL — ABNORMAL HIGH (ref 0.7–4.0)
MCH: 27.6 pg (ref 26.0–34.0)
MCHC: 30.9 g/dL (ref 30.0–36.0)
MCV: 89.4 fL (ref 80.0–100.0)
Monocytes Absolute: 0.8 10*3/uL (ref 0.1–1.0)
Monocytes Relative: 9 %
Neutro Abs: 2.1 10*3/uL (ref 1.7–7.7)
Neutrophils Relative %: 26 %
Platelets: 417 10*3/uL — ABNORMAL HIGH (ref 150–400)
RBC: 4.42 MIL/uL (ref 3.87–5.11)
RDW: 12.7 % (ref 11.5–15.5)
WBC: 8.3 10*3/uL (ref 4.0–10.5)
nRBC: 0 % (ref 0.0–0.2)

## 2021-02-15 LAB — URINALYSIS, ROUTINE W REFLEX MICROSCOPIC
Bilirubin Urine: NEGATIVE
Glucose, UA: NEGATIVE mg/dL
Hgb urine dipstick: NEGATIVE
Ketones, ur: NEGATIVE mg/dL
Nitrite: NEGATIVE
Protein, ur: NEGATIVE mg/dL
Specific Gravity, Urine: 1.02 (ref 1.005–1.030)
pH: 6 (ref 5.0–8.0)

## 2021-02-15 LAB — I-STAT BETA HCG BLOOD, ED (MC, WL, AP ONLY): I-stat hCG, quantitative: <5 m[IU]/mL (ref <5)

## 2021-02-15 LAB — RAPID URINE DRUG SCREEN, HOSP PERFORMED
Amphetamines: NOT DETECTED
Barbiturates: NOT DETECTED
Benzodiazepines: POSITIVE — AB
Cocaine: NOT DETECTED
Opiates: NOT DETECTED
Tetrahydrocannabinol: NOT DETECTED

## 2021-02-15 LAB — CBG MONITORING, ED: Glucose-Capillary: 82 mg/dL (ref 70–99)

## 2021-02-15 MED ORDER — ACETAMINOPHEN 325 MG PO TABS
650.0000 mg | ORAL_TABLET | Freq: Once | ORAL | Status: AC
  Administered 2021-02-15: 650 mg via ORAL
  Filled 2021-02-15: qty 2

## 2021-02-15 MED ORDER — SODIUM CHLORIDE 0.9 % IV BOLUS
1000.0000 mL | Freq: Once | INTRAVENOUS | Status: AC
  Administered 2021-02-15: 1000 mL via INTRAVENOUS

## 2021-02-15 NOTE — ED Provider Notes (Signed)
[facility] EMERGENCY DEPARTMENT Provider Note  CSN: [id number] Arrival date & time: 02/15/21 7741    History Chief Complaint  Patient presents with  . Seizures    HPI  Dana Lozano is a 40 y.o. female with history of fibromyalgia, gastroparesis, chronic pelvic pain, interstitial cystitis, chronic lyme disease and seizure disorder called EMS this morning when she felt like she was going to have a seizure. They arrived to find her face down on the floor with laceration to L eyebrow and unresponsive. She ahd some continued seizure like activity and was given a total of 7.5mg  versed enroute. She is currently unresponsive and unable to give any additional history. From review of her available medical records she has not been seen here for seizures since 2019. At that time it was noted that she had not been able to tolerate Keppra and was being managed with TID doses of Valium 10mg  at home. Her current medication list includes Lamictal and Trileptal. She also has a spinal cord stimulator and buprenorphine patches for her pain.   Past Medical History:  Diagnosis Date  . Anemia, iron deficiency 02/05/2013  . Asthma   . Bile reflux esophagitis   . Bile salt-induced diarrhea   . Chronic abdominal pain   . Fibromyalgia   . Gastroparesis   . GERD (gastroesophageal reflux disease)   . GERD (gastroesophageal reflux disease)   . IBS (irritable bowel syndrome)   . Interstitial cystitis   . Irritable bowel   . Kidney stones   . Lyme disease   . Migraine headache   . Nausea vomiting and diarrhea    recurrent  . Seizure (HCC)   . Seizures (HCC)     Past Surgical History:  Procedure Laterality Date  . bladder stretch    . CHOLECYSTECTOMY    . EXPLORATORY LAPAROTOMY    . IR GENERIC HISTORICAL  03/01/2017   IR REMOVAL TUN CV CATH W/O FL [facility]-INTERV RAD  . NASAL SINUS SURGERY    . PORT-A-CATH REMOVAL    . SINUSOTOMY      Family History  Problem Relation Age of Onset  .  Hypertension Father   . Mental illness Father        depression    Social History   Tobacco Use  . Smoking status: Former Smoker    Quit date: 01/16/2003    Years since quitting: 18.0  . Smokeless tobacco: Never Used  Substance Use Topics  . Alcohol use: No    Alcohol/week: 0.0 standard drinks  . Drug use: No     Home Medications Prior to Admission medications   Medication Sig Start Date End Date Taking? Authorizing Provider  buprenorphine (BUTRANS) 10 MCG/HR PTWK patch Place 10 mcg onto the skin once a week.    [provider]  buPROPion (WELLBUTRIN XL) 300 MG 24 hr tablet Take 300 mg by mouth daily.    [provider]  cetirizine (ZYRTEC) 10 MG tablet Take 10 mg by mouth daily.    [provider]  cimetidine (TAGAMET) 200 MG tablet Take 200 mg by mouth at bedtime.     [provider]  diazepam (VALIUM) 10 MG tablet 10 mg every 8 (eight) hours as needed for anxiety.  06/10/16   [provider]  dicyclomine (BENTYL) 10 MG capsule Take 10 mg by mouth daily as needed for spasms.    [provider]  diphenoxylate-atropine (LOMOTIL) 2.5-0.025 MG per tablet Take 2 tablets by mouth 4 (  four) times daily as needed (IBS).     [provider]  dronabinol (MARINOL) 10 MG capsule Take 10 mg by mouth 3 (three) times daily as needed (nausea/bone pain).     [provider]  esomeprazole (NEXIUM) 20 MG capsule Take 40 mg by mouth 2 (two) times daily before a meal.     [provider]  hydrOXYzine (ATARAX/VISTARIL) 25 MG tablet Take 25 mg by mouth at bedtime.     [provider]  lamoTRIgine (LAMICTAL) 100 MG tablet Take 200 mg by mouth 3 (three) times daily.     [provider]  levonorgestrel-ethinyl estradiol (SEASONALE) 0.15-0.03 MG tablet Take 1 tablet by mouth every evening.     [provider]  lidocaine (XYLOCAINE) 5 % ointment Apply 5 application topically every morning. 04/27/16    [provider]  OXcarbazepine (TRILEPTAL) 300 MG/5ML suspension Take 175 mg by mouth 3 (three) times daily.    [provider]  pentosan polysulfate (ELMIRON) 100 MG capsule Take 100 mg by mouth 3 (three) times daily.     [provider]  pregabalin (LYRICA) 25 MG capsule Take 25 mg by mouth every evening.     [provider]  PRESCRIPTION MEDICATION See admin instructions. IV promethazine: sent to patient from [facility]:  [id number], [place], [place] [address] [phone]    [provider]  Probiotic Product (PROBIOTIC DAILY PO) Take 1 capsule by mouth at bedtime.     [provider]  prochlorperazine (COMPAZINE) 10 MG tablet Take 1 tablet (10 mg total) by mouth every 6 (six) hours as needed for nausea or vomiting (or headache). 04/03/18   Dione Booze, MD  QUEtiapine (SEROQUEL) 25 MG tablet Take 50 mg by mouth at bedtime.  12/26/15   [provider]  venlafaxine XR (EFFEXOR-XR) 75 MG 24 hr capsule Take 225 mg by mouth every morning.     [provider]     Allergies    Ibuprofen, Reglan [metoclopramide], Reglan [metoclopramide], Septra [sulfamethoxazole-trimethoprim], Doxycycline, Ibuprofen, Lactose intolerance (gi), and Septra [sulfamethoxazole-trimethoprim]   Review of Systems   Review of Systems Unable to assess due to mental status.    Physical Exam BP (!) 195/103 (BP Location: Right Arm)   Pulse (!) 119   Temp 98 F (36.7 C) (Axillary)   Resp 18   SpO2 100%   Physical Exam Vitals and nursing note reviewed.  Constitutional:      Appearance: Normal appearance.  HENT:     Head: Normocephalic.     Comments: Abrasion/laceration to L eyebrow    Nose: Nose normal.     Mouth/Throat:     Mouth: Mucous membranes are moist.  Eyes:     Extraocular Movements: Extraocular movements intact.     Conjunctiva/sclera: Conjunctivae normal.  Neck:     Comments: In c-collar Cardiovascular:      Rate and Rhythm: Normal rate.  Pulmonary:     Effort: Pulmonary effort is normal.     Breath sounds: Normal breath sounds.  Abdominal:     General: Abdomen is flat.     Palpations: Abdomen is soft.     Tenderness: There is no abdominal tenderness.  Musculoskeletal:        General: No swelling. Normal range of motion.  Skin:    General: Skin is warm and dry.  Neurological:     Comments: Does not respond to verbal or painful stimuli. Having occasional spells of increased  UE tonicity without clonic activity and associated with grimacing.   Psychiatric:        Mood and Affect: Mood normal.      ED Results / Procedures / Treatments   Labs (all labs ordered are listed, but only abnormal results are displayed) Labs Reviewed  COMPREHENSIVE METABOLIC PANEL  CBC WITH DIFFERENTIAL/PLATELET  URINALYSIS, ROUTINE W REFLEX MICROSCOPIC  RAPID URINE DRUG SCREEN, HOSP PERFORMED  ETHANOL  CBG MONITORING, ED  I-STAT BETA HCG BLOOD, ED ([facility], [facility], [facility] ONLY)    EKG None  Radiology No results found.  Procedures Procedures  Medications Ordered in the ED Medications  sodium chloride 0.9 % bolus 1,000 mL (has no administration in time range)     MDM Rules/Calculators/A&P MDM Unclear if the patient is still having subclinical seizure activity after Versed 7.5mg . Will check labs, imaging of head and C-spine due to unwitnessed fall. Continue to observe for signs of seizure.   ED Course  I have reviewed the triage vital signs and the nursing notes.  Pertinent labs & imaging results that were available during my care of the patient were reviewed by me and considered in my medical decision making (see chart for details).  Clinical Course as of 02/15/21 1533  Wed Feb 15, 2021  0956 CBC with mild thrombocytosis, otherwise normal.  [CS]  1006 CMP is normal.  [CS]  1048 Patient is now awake and alert, complaining of headache and L hip pain. Awaiting CT, will add hip xray.  [CS]  1303 Patient's  imaging negative. Wound on eyebrow is superficial and does not require primary repair. Patient reports compliance with antiepileptics but has not been sleeping well.  [CS]    Clinical Course User Index [CS] Pollyann Savoy, MD    Final Clinical Impression(s) / ED Diagnoses Final diagnoses:  Seizure (HCC)  Abrasion of forehead, initial encounter    Rx / DC Orders ED Discharge Orders    None       Pollyann Savoy, MD 02/15/21 [id number]

## 2021-02-15 NOTE — ED Triage Notes (Signed)
Pt BIB EMS from home due to seizures. Pt Called 911 and stated she felt like she was going to have seizure . When EMS arrived she was face down on the ground.  EMS gave 7.5 versed.

## 2021-02-16 LAB — LAMOTRIGINE LEVEL: Lamotrigine Lvl: 14.7 ug/mL (ref 2.0–20.0)

## 2022-07-08 ENCOUNTER — Emergency Department (HOSPITAL_COMMUNITY)
Admission: EM | Admit: 2022-07-08 | Discharge: 2022-07-09 | Disposition: A | Discharge status: Home / Self Care | Payer: BC Managed Care – PPO | Attending: Emergency Medicine | Admitting: Emergency Medicine

## 2022-07-08 DIAGNOSIS — J45909 Unspecified asthma, uncomplicated: Secondary | ICD-10-CM | POA: Diagnosis not present

## 2022-07-08 DIAGNOSIS — R569 Unspecified convulsions: Secondary | ICD-10-CM | POA: Diagnosis present

## 2022-07-08 DIAGNOSIS — Y900 Blood alcohol level of less than 20 mg/100 ml: Secondary | ICD-10-CM | POA: Insufficient documentation

## 2022-07-08 DIAGNOSIS — R4182 Altered mental status, unspecified: Secondary | ICD-10-CM | POA: Insufficient documentation

## 2022-07-08 LAB — I-STAT BETA HCG BLOOD, ED (MC, WL, AP ONLY): I-stat hCG, quantitative: <5 m[IU]/mL (ref <5)

## 2022-07-08 LAB — CBG MONITORING, ED: Glucose-Capillary: 110 mg/dL — ABNORMAL HIGH (ref 70–99)

## 2022-07-08 MED ORDER — ONDANSETRON HCL 4 MG/2ML IJ SOLN
4.0000 mg | Freq: Once | INTRAMUSCULAR | Status: AC
  Administered 2022-07-08: 4 mg via INTRAVENOUS
  Filled 2022-07-08: qty 2

## 2022-07-08 MED ORDER — SODIUM CHLORIDE 0.9 % IV BOLUS
1000.0000 mL | Freq: Once | INTRAVENOUS | Status: AC
Start: 2022-07-09 — End: 2022-07-09
  Administered 2022-07-09: 1000 mL via INTRAVENOUS

## 2022-07-08 MED ORDER — LORAZEPAM 2 MG/ML IJ SOLN
1.0000 mg | Freq: Once | INTRAMUSCULAR | Status: AC
  Administered 2022-07-09: 1 mg via INTRAVENOUS
  Filled 2022-07-08: qty 1

## 2022-07-08 NOTE — ED Triage Notes (Addendum)
Present from home for possible seizure, told family she was getting an aura and was going to take a diazepam and lie down, found postictal in bed by mother. EMS called, no meds given by EMS. She has a history of previous seizures from Lyme disease. Recent abx use for ear infxn Unclear what home medications she takes. Arrives only withdrawing from pain. After establishing ine is responding to voice at this time, disorientedx4. Seizure pads applied. Suction available.

## 2022-07-08 NOTE — ED Provider Notes (Signed)
Emergency Department Provider Note   I have reviewed the triage vital signs and the nursing notes.   HISTORY  Chief Complaint Seizures   HPI Dana Lozano is a 41 y.o. female with PMH reviewed below presents to the ED with AMS and concern for breakthrough seizure. Patient is not currently under the care of a Neurologist and not on AEDs. Patient describes chronic lime and symptoms related to this. She does not drive. No fever. No HA. Mom notes that she was called into the room by the patient and then found her less responsive and confused. Patient describes having a brief seizure just prior to this but was not witnessed.    Past Medical History:  Diagnosis Date   Anemia, iron deficiency 02/05/2013   Asthma    Bile reflux esophagitis    Bile salt-induced diarrhea    Chronic abdominal pain    Fibromyalgia    Gastroparesis    GERD (gastroesophageal reflux disease)    GERD (gastroesophageal reflux disease)    IBS (irritable bowel syndrome)    Interstitial cystitis    Irritable bowel    Kidney stones    Lyme disease    Migraine headache    Nausea vomiting and diarrhea    recurrent   Seizure (HCC)    Seizures (HCC)     Review of Systems  Constitutional: No fever/chills Cardiovascular: Denies chest pain. Respiratory: Denies shortness of breath. Gastrointestinal: No abdominal pain.  Genitourinary: Negative for dysuria. Musculoskeletal: Negative for back pain. Skin: Negative for rash. Neurological: Negative for headaches, focal weakness or numbness. Positive breakthrough seizure.    ____________________________________________   PHYSICAL EXAM:  VITAL SIGNS: ED Triage Vitals  Enc Vitals Group     BP 07/08/22 2306 (!) 171/139     Pulse Rate 07/08/22 2304 (!) 105     Resp 07/08/22 2304 18     Temp 07/08/22 2304 99.1 F (37.3 C)     Temp Source 07/08/22 2304 Oral     SpO2 07/08/22 2306 97 %   Constitutional: Alert and oriented. Well appearing and in no  acute distress. Eyes: Conjunctivae are normal.  Head: Atraumatic. Nose: No congestion/rhinnorhea. Mouth/Throat: Mucous membranes are moist.   Neck: No stridor Cardiovascular: Normal rate, regular rhythm. Good peripheral circulation. Grossly normal heart sounds.   Respiratory: Normal respiratory effort.  No retractions. Lungs CTAB. Gastrointestinal: Soft and nontender. No distention.  Musculoskeletal: No lower extremity tenderness nor edema. No gross deformities of extremities. Neurologic:  Normal speech and language. No gross focal neurologic deficits are appreciated.  Skin:  Skin is warm, dry and intact. No rash noted.  ____________________________________________   LABS (all labs ordered are listed, but only abnormal results are displayed)  Labs Reviewed  COMPREHENSIVE METABOLIC PANEL - Abnormal; Notable for the following components:      Result Value   Glucose, Bld 102 (*)    All other components within normal limits  CBC WITH DIFFERENTIAL/PLATELET - Abnormal; Notable for the following components:   WBC 11.0 (*)    Platelets 474 (*)    Lymphs Abs 5.7 (*)    All other components within normal limits  RAPID URINE DRUG SCREEN, HOSP PERFORMED - Abnormal; Notable for the following components:   Benzodiazepines POSITIVE (*)    All other components within normal limits  CBG MONITORING, ED - Abnormal; Notable for the following components:   Glucose-Capillary 110 (*)    All other components within normal limits  LIPASE, BLOOD  URINALYSIS, ROUTINE W  REFLEX MICROSCOPIC  ETHANOL  I-STAT BETA HCG BLOOD, ED ([facility], [facility], [facility] ONLY)   ____________________________________________  EKG   EKG Interpretation  Date/Time:  Monday July 09 2022 00:02:41 EDT Ventricular Rate:  97 PR Interval:  145 QRS Duration: 103 QT Interval:  372 QTC Calculation: 473 R Axis:   67 Text Interpretation: Sinus rhythm Probable inferior infarct, old Baseline wander in lead(s) II III aVF Confirmed by Alona Bene  [phone]) on 07/09/2022 12:06:56 AM        ____________________________________________  RADIOLOGY  CT Head Wo Contrast  Result Date: 07/09/2022 CLINICAL DATA:  Seizure, new-onset, no history of trauma EXAM: CT HEAD WITHOUT CONTRAST TECHNIQUE: Contiguous axial images were obtained from the base of the skull through the vertex without intravenous contrast. RADIATION DOSE REDUCTION: This exam was performed according to the departmental dose-optimization program which includes automated exposure control, adjustment of the mA and/or kV according to patient size and/or use of iterative reconstruction technique. COMPARISON:  03/24/2017 FINDINGS: Brain: No acute intracranial abnormality. Specifically, no hemorrhage, hydrocephalus, mass lesion, acute infarction, or significant intracranial injury. Vascular: No hyperdense vessel or unexpected calcification. Skull: No acute calvarial abnormality. Sinuses/Orbits: No acute findings Other: None IMPRESSION: No acute intracranial abnormality. Electronically Signed   By: Charlett Nose M.D.   On: 07/09/2022 01:54    ____________________________________________   PROCEDURES  Procedure(s) performed:   Procedures  None  ____________________________________________   INITIAL IMPRESSION / ASSESSMENT AND PLAN / ED COURSE  Pertinent labs & imaging results that were available during my care of the patient were reviewed by me and considered in my medical decision making (see chart for details).   This patient is Presenting for Evaluation of AMS, which does require a range of treatment options, and is a complaint that involves a high risk of morbidity and mortality.  The Differential Diagnoses includes but is not exclusive to alcohol, illicit or prescription medications, intracranial pathology such as stroke, intracerebral hemorrhage, fever or infectious causes including sepsis, hypoxemia, uremia, trauma, endocrine related disorders such as diabetes, hypoglycemia,  thyroid-related diseases, etc.   Critical Interventions-    Medications  ondansetron (ZOFRAN) injection 4 mg (4 mg Intravenous Given 07/08/22 2315)  sodium chloride 0.9 % bolus 1,000 mL (0 mLs Intravenous Stopped 07/09/22 0156)  LORazepam (ATIVAN) injection 1 mg (1 mg Intravenous Given 07/09/22 0021)    Reassessment after intervention: Mental status has returned to baseline.    Clinical Laboratory Tests Ordered, included pregnancy negative.  Mild leukocytosis without anemia.  No acute kidney injury.  UA negative.  Radiologic Tests Ordered, included CT head. I independently interpreted the images and agree with radiology interpretation.   Cardiac Monitor Tracing which shows NSR.   Social Determinants of Health Risk patient with prior smoking history. No EtOH.   Medical Decision Making: Summary:  Patient presents to the emergency department for evaluation of altered mental status and concern for breakthrough seizure.  Patient is not on antiepileptic drugs.  She is not following with a neurologist.  She does not drive and understands that she is not able to [facility] law with concern for breakthrough seizure today.  Lab work and CT imaging are reassuring.  On reevaluation patient is returned to her mental status baseline.  Considered admission but with return to normal mental status I plan to refer to local neurology, to try and reestablish care. Mom here and will drive home.    Disposition: discharge  ____________________________________________  FINAL CLINICAL IMPRESSION(S) / ED DIAGNOSES  Final diagnoses:  Seizure-like activity (HCC)    Note:  This document was prepared using Dragon voice recognition software and may include unintentional dictation errors.  Alona Bene, MD, [facility]    Deshun Sedivy, Arlyss Repress, MD 07/09/22 [id number]

## 2022-07-09 ENCOUNTER — Emergency Department (HOSPITAL_COMMUNITY): Payer: BC Managed Care – PPO

## 2022-07-09 LAB — URINALYSIS, ROUTINE W REFLEX MICROSCOPIC
Bilirubin Urine: NEGATIVE
Glucose, UA: NEGATIVE mg/dL
Hgb urine dipstick: NEGATIVE
Ketones, ur: NEGATIVE mg/dL
Leukocytes,Ua: NEGATIVE
Nitrite: NEGATIVE
Protein, ur: NEGATIVE mg/dL
Specific Gravity, Urine: 1.019 (ref 1.005–1.030)
pH: 7 (ref 5.0–8.0)

## 2022-07-09 LAB — CBC WITH DIFFERENTIAL/PLATELET
Abs Immature Granulocytes: 0.05 10*3/uL (ref 0.00–0.07)
Basophils Absolute: 0 10*3/uL (ref 0.0–0.1)
Basophils Relative: 0 %
Eosinophils Absolute: 0.4 10*3/uL (ref 0.0–0.5)
Eosinophils Relative: 3 %
HCT: 40.5 % (ref 36.0–46.0)
Hemoglobin: 13.2 g/dL (ref 12.0–15.0)
Immature Granulocytes: 1 %
Lymphocytes Relative: 52 %
Lymphs Abs: 5.7 10*3/uL — ABNORMAL HIGH (ref 0.7–4.0)
MCH: 28.5 pg (ref 26.0–34.0)
MCHC: 32.6 g/dL (ref 30.0–36.0)
MCV: 87.5 fL (ref 80.0–100.0)
Monocytes Absolute: 0.9 10*3/uL (ref 0.1–1.0)
Monocytes Relative: 8 %
Neutro Abs: 3.9 10*3/uL (ref 1.7–7.7)
Neutrophils Relative %: 36 %
Platelets: 474 10*3/uL — ABNORMAL HIGH (ref 150–400)
RBC: 4.63 MIL/uL (ref 3.87–5.11)
RDW: 12.2 % (ref 11.5–15.5)
WBC: 11 10*3/uL — ABNORMAL HIGH (ref 4.0–10.5)
nRBC: 0 % (ref 0.0–0.2)

## 2022-07-09 LAB — COMPREHENSIVE METABOLIC PANEL
ALT: 27 U/L (ref 0–44)
AST: 23 U/L (ref 15–41)
Albumin: 4.2 g/dL (ref 3.5–5.0)
Alkaline Phosphatase: 102 U/L (ref 38–126)
Anion gap: 8 (ref 5–15)
BUN: 11 mg/dL (ref 6–20)
CO2: 26 mmol/L (ref 22–32)
Calcium: 9.2 mg/dL (ref 8.9–10.3)
Chloride: 103 mmol/L (ref 98–111)
Creatinine, Ser: 0.76 mg/dL (ref 0.44–1.00)
GFR, Estimated: >60 mL/min (ref >60)
Glucose, Bld: 102 mg/dL — ABNORMAL HIGH (ref 70–99)
Potassium: 4.5 mmol/L (ref 3.5–5.1)
Sodium: 137 mmol/L (ref 135–145)
Total Bilirubin: 0.4 mg/dL (ref 0.3–1.2)
Total Protein: 7.9 g/dL (ref 6.5–8.1)

## 2022-07-09 LAB — LIPASE, BLOOD: Lipase: 27 U/L (ref 11–51)

## 2022-07-09 LAB — RAPID URINE DRUG SCREEN, HOSP PERFORMED
Amphetamines: NOT DETECTED
Barbiturates: NOT DETECTED
Benzodiazepines: POSITIVE — AB
Cocaine: NOT DETECTED
Opiates: NOT DETECTED
Tetrahydrocannabinol: NOT DETECTED

## 2022-07-09 LAB — ETHANOL: Alcohol, Ethyl (B): <10 mg/dL (ref <10)

## 2022-07-09 NOTE — ED Notes (Addendum)
Patient ambulated to the restroom with assistance.

## 2022-07-09 NOTE — Discharge Instructions (Signed)

## 2022-07-10 ENCOUNTER — Encounter: Payer: Self-pay | Admitting: Family

## 2023-11-26 ENCOUNTER — Encounter: Payer: Self-pay | Admitting: Neurology

## 2024-01-29 ENCOUNTER — Encounter: Payer: Self-pay | Admitting: Neurology

## 2024-01-29 ENCOUNTER — Ambulatory Visit: Payer: BC Managed Care – PPO | Admitting: Neurology

## 2024-01-29 VITALS — BP 121/78 | HR 124 | Ht 67.0 in | Wt 210.4 lb

## 2024-01-29 DIAGNOSIS — R251 Tremor, unspecified: Secondary | ICD-10-CM

## 2024-01-29 DIAGNOSIS — R404 Transient alteration of awareness: Secondary | ICD-10-CM | POA: Diagnosis not present

## 2024-01-29 NOTE — Progress Notes (Signed)
 NEUROLOGY CONSULTATION NOTE  Dana Lozano MRN: [id number] DOB: [birth year]  Referring provider: Jamey Reas, PA-C Primary care provider: Jamey Reas, PA-C  Reason for consult:  seizures  Thank you for your kind referral of Dana Lozano for consultation of the above symptoms. Although her history is well known to you, please allow me to reiterate it for the purpose of our medical record. The patient was accompanied to the clinic by her mother who also provides collateral information. Records and images were personally reviewed where available.   HISTORY OF PRESENT ILLNESS: This is a 43 year old right-handed woman with a complicated history presenting for evaluation of seizures. They report that she was a high school senior when she started getting weird problems with severe exhaustion, pain, brain fog, issues concentrating. She gets soreness in the back of her neck and does not feel "all there." She was having these since 2001/2002 and started seeing Dr. Abner Greenspan in [place], [place] who diagnosed her with chronic lyme disease and had her on a protocol. Symptoms improved however when she was in her 12s, symptoms got worse and it felt like when she had Lyme disease when younger. 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 She started having episodes where she has tingling in her neck radiating diffusely then she senses her body shaking but cannot see it, losing consciousness. If her brain is feeling weird, she has tenderness on the occipital bone on the left side. She can call EMS that she is going into a seizure then she is completely out. When EMS arrives, she can sort of understand but cannot answer. She has had urinary incontinence with these, no tongue bite or focal weakness. She was told she stopped breathing in the ambulance with 2 of the  seizures, she could sense she could not breathe and was motioning this to EMS. Another time, she fell and hit her head and EMS had to break into her house. They report the last time she had a bigger event like this was in 2022. She moved back in with her mother in 2022.  Dr. Abner Greenspan started her on Trileptal 6mL BID (175mg  BID)  and Lamotrigine ODT 100mg  2 tab TID, as well as Valium (she is prescribed 10mg  every 8 hours). When she feels something coming, she stays in bed, she has tingling in her neck and takes a Valium with "zaps it usually." She lays in a dark room with no sounds. She takes Valium at least once a day "to keep my nervous system calm." If she has the tenderness in the back of her head or body jerking, she takes a Valium. She is almost always feeling "kind of off sensory-wise, nerves just feel heightened."   She became tearful and states they could not find a neurologist that understands Lyme disease. She was evaluated at [facility] in 2018 where they reported seizures started in 2013. She reports seizure triggers include flashing lights, crazy sounds. EMU monitoring was recommended but she did not proceed with testing. Apparently Dr. Wyvonnia Dusky office closed in February and she had to get her medications from her PCP. 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 Dr. Abner Greenspan also started her on Lyrica for the pain.    PAST MEDICAL HISTORY: Past Medical History:  Diagnosis Date  Anemia, iron deficiency 02/05/2013   Asthma    Bile reflux esophagitis    Bile salt-induced diarrhea    Chronic abdominal pain    Fibromyalgia    Gastroparesis    GERD (gastroesophageal reflux disease)    GERD (gastroesophageal reflux disease)    IBS (irritable bowel syndrome)    Interstitial cystitis    Irritable bowel    Kidney stones    Lyme disease    Migraine headache    Nausea vomiting and diarrhea    recurrent   Seizure (HCC)    Seizures  (HCC)     PAST SURGICAL HISTORY: Past Surgical History:  Procedure Laterality Date   bladder stretch     CHOLECYSTECTOMY     EXPLORATORY LAPAROTOMY     IR GENERIC HISTORICAL  03/01/2017   IR REMOVAL TUN CV CATH W/O FL [facility]-INTERV RAD   NASAL SINUS SURGERY     PORT-A-CATH REMOVAL     SINUSOTOMY      MEDICATIONS: Current Outpatient Medications on File Prior to Visit  Medication Sig Dispense Refill   baclofen (LIORESAL) 20 MG tablet Take 20 mg by mouth 3 (three) times daily as needed.     buprenorphine (BUTRANS) 20 MCG/HR PTWK Place onto the skin once a week. Wednesday     buPROPion (WELLBUTRIN XL) 300 MG 24 hr tablet Take 300 mg by mouth daily.     cetirizine (ZYRTEC) 10 MG tablet Take 10 mg by mouth daily.     cimetidine (TAGAMET) 200 MG tablet Take 200 mg by mouth at bedtime.      Coenzyme Q10 (COQ10 PO) Take by mouth. Procaps     diazepam (VALIUM) 10 MG tablet Take 10 mg by mouth every 8 (eight) hours as needed for anxiety (when feel like about to have a seizure).  2   diphenoxylate-atropine (LOMOTIL) 2.5-0.025 MG per tablet Take 2 tablets by mouth 4 (four) times daily as needed (IBS).      esomeprazole (NEXIUM) 20 MG capsule Take 40 mg by mouth 2 (two) times daily before a meal.      hydrOXYzine (ATARAX/VISTARIL) 25 MG tablet Take 50 mg by mouth at bedtime.     lamoTRIgine (LAMICTAL) 100 MG tablet Take 200 mg by mouth 3 (three) times daily.     levonorgestrel-ethinyl estradiol (SEASONALE) 0.15-0.03 MG tablet Take 1 tablet by mouth every evening.      montelukast (SINGULAIR) 10 MG tablet Take 10 mg by mouth every evening.     Multiple Vitamin (MULTIVITAMIN) tablet Take 1 tablet by mouth daily. Procaps     ondansetron (ZOFRAN) 8 MG tablet Take 8 mg by mouth 3 (three) times daily as needed.     OXcarbazepine (TRILEPTAL) 300 MG/5ML suspension Take 175 mg by mouth 2 (two) times daily.     pentosan polysulfate (ELMIRON) 100 MG capsule Take 100-200 mg by mouth See admin instructions. Take 2  capsules by  mouth in the morning and 1 capsule in the evening     pravastatin (PRAVACHOL) 40 MG tablet Take 40 mg by mouth daily.     pregabalin (LYRICA) 25 MG capsule Take 25 mg by mouth 2 (two) times daily.     QUEtiapine (SEROQUEL) 25 MG tablet Take 50 mg by mouth at bedtime.   3   venlafaxine XR (EFFEXOR-XR) 75 MG 24 hr capsule Take 225 mg by mouth every morning.      dicyclomine (BENTYL) 10 MG capsule Take 10 mg by mouth daily as needed for spasms. (  Patient not taking: Reported on 01/29/2024)     Probiotic Product (PROBIOTIC DAILY PO) Take 1 capsule by mouth 3 (three) times daily after meals. (Patient not taking: Reported on 01/29/2024)     No current facility-administered medications on file prior to visit.    ALLERGIES: Allergies  Allergen Reactions   Ibuprofen Anaphylaxis   Reglan [Metoclopramide] Other (See Comments)    Nerve pain   Levetiracetam Nausea And Vomiting    Other reaction(s): Other (See Comments) Severe headache     Oxycodone-Acetaminophen Nausea And Vomiting    Other reaction(s): Dizziness (intolerance)   Reglan [Metoclopramide] Other (See Comments)    Severe body pain    Doxycycline Diarrhea    severe   Lactose Intolerance (Gi) Diarrhea   Septra [Sulfamethoxazole-Trimethoprim] Other (See Comments)    Severe mouth ulcers, possibly caused seizures    FAMILY HISTORY: Family History  Problem Relation Age of Onset   Hypertension Father    Mental illness Father        depression    SOCIAL HISTORY: Social History   Socioeconomic History   Marital status: Single    Spouse name: n/a   Number of children: 0   Years of education: college   Highest education level: Not on file  Occupational History   Occupation: unemployed    Comment: denied disability x 2  Tobacco Use   Smoking status: Former    Current packs/day: 0.00    Types: Cigarettes    Quit date: 01/16/2003    Years since quitting: 21.0   Smokeless tobacco: Never  Vaping Use   Vaping  status: Never Used  Substance and Sexual Activity   Alcohol use: No    Alcohol/week: 0.0 standard drinks of alcohol   Drug use: No   Sexual activity: Not Currently  Other Topics Concern   Not on file  Social History Narrative   ** Merged History Encounter ** Unable to work due to the neurologic effects of Lyme Disease. Lives alone. Her parents live in [place]. Her parents support her financially. Right handed    Social Drivers of Health   Financial Resource Strain: Not on file  Food Insecurity: Not on file  Transportation Needs: Not on file  Physical Activity: Not on file  Stress: Not on file  Social Connections: Unknown (05/10/2022)   Received from [facility]   Social Network    Social Network: Not on file  Intimate Partner Violence: Unknown (04/04/2022)   Received from [facility], [facility]   HITS    Physically Hurt: Not on file    Insult or Talk Down To: Not on file    Threaten Physical Harm: Not on file    Scream or Curse: Not on file     PHYSICAL EXAM: Vitals:   01/29/24 1351  BP: 121/78  Pulse: (!) 124  SpO2: 96%   General: No acute distress, tearful, anxious Head:  Normocephalic/atraumatic Skin/Extremities: No rash, no edema Neurological Exam: Mental status: alert and awake, no dysarthria or aphasia, Fund of knowledge is appropriate.  Attention and concentration are normal.   Cranial nerves: CN I: not tested CN II: pupils equal, round, visual fields intact CN III, IV, VI:  full range of motion, no nystagmus, no ptosis CN V: facial sensation intact CN VII: upper and lower face symmetric CN VIII: hearing intact to conversation Bulk & Tone: normal, no fasciculations. Motor: 5/5 throughout with no pronator drift. Deep Tendon Reflexes: +2 throughout Cerebellar: no incoordination on  finger to nose testing Gait: narrow-based and steady, able to tandem walk adequately. Tremor: none   IMPRESSION: This is a 43 year old right-handed  woman with a complicated history presenting for evaluation of seizures. She was diagnosed by a Lyme specialist in [place] with chronic lyme disease who treated her with Trileptal, Lamotrigine, and Valium for seizures. We discussed doing a 1-hour EEG to characterize her seizures. If normal, a 48-hour EEG will be done. We discussed different types of seizures, including functional seizures. We discussed that such significant amounts of Valium are not prescribed in our office nor is it typically used for epileptic seizures in this way. They report her lyme specialist's office had closed, however on a brief internet search, it appears Dr. Wyvonnia Dusky office is open, she was advised to re-establish care with him for continuation of care/medications. If EEG shows evidence of epileptic seizures, we may consider doing a brain MRI and adjust the Lamotrigine or Trileptal, although I suspect these are helping more with mood stabilization. All their questions were answered to the best of my abilities. She does not drive. Follow-up in 3-4 months, call for any changes.    Thank you for allowing me to participate in the care of this patient. Please do not hesitate to call for any questions or concerns.   Patrcia Dolly, M.D.  CC: Stefani Dama, PA-C

## 2024-01-29 NOTE — Patient Instructions (Addendum)
Good to meet you.  Schedule 1-hour EEG. We will then do a 48-hour EEG  2. Continue all your medications  3. We may consider doing brain MRI  4. Follow-up in 3-4 months, call for any changes

## 2024-02-06 ENCOUNTER — Other Ambulatory Visit: Payer: BC Managed Care – PPO

## 2024-02-18 ENCOUNTER — Ambulatory Visit: Payer: BC Managed Care – PPO | Admitting: Neurology

## 2024-02-18 DIAGNOSIS — R404 Transient alteration of awareness: Secondary | ICD-10-CM | POA: Diagnosis not present

## 2024-02-18 DIAGNOSIS — R251 Tremor, unspecified: Secondary | ICD-10-CM | POA: Diagnosis not present

## 2024-02-18 NOTE — Progress Notes (Signed)
 EEG complete - results pending

## 2024-02-26 NOTE — Addendum Note (Signed)
 Addended by: Van Clines on: 02/26/2024 03:42 PM   Modules accepted: Orders

## 2024-02-26 NOTE — Procedures (Signed)
 ELECTROENCEPHALOGRAM REPORT  Date of Study: 02/18/2024  Patient's Name: Dana Lozano MRN: [id number] Date of Birth: 1981/03/31  Referring Provider: Dr. Patrcia Dolly  Clinical History: This is a 43 year old woman with recurrent episodes of shaking with loss of consciousness. EEG for classification.  CNS Active Medications: Trileptal, Lamictal, Lyrica, Valium, Seroquel, Wellbutrin, Venlafaxine, Baclofen, Butrans  Technical Summary: A multichannel digital 1-hour EEG recording measured by the international 10-20 system with electrodes applied with paste and impedances below 5000 ohms performed in our laboratory with EKG monitoring in an awake and drowsy patient.  Hyperventilation and photic stimulation were performed.  The digital EEG was referentially recorded, reformatted, and digitally filtered in a variety of bipolar and referential montages for optimal display.    Description: The patient is awake and drowsy during the recording.  During maximal wakefulness, there is a symmetric, medium voltage 10 Hz posterior dominant rhythm that attenuates with eye opening.  The record is symmetric.  During drowsiness, there is an increase in theta slowing of the background.  Sleep was not captured. Hyperventilation and photic stimulation did not elicit any abnormalities.  There were no epileptiform discharges or electrographic seizures seen.    EKG lead was unremarkable.  Impression: This 1-hour awake and drowsy EEG is normal.    Clinical Correlation: A normal EEG does not exclude a clinical diagnosis of epilepsy.  If further clinical questions remain, prolonged EEG may be helpful.  Clinical correlation is advised.   Patrcia Dolly, M.D.

## 2024-03-09 ENCOUNTER — Telehealth: Payer: Self-pay | Admitting: *Deleted

## 2024-03-10 ENCOUNTER — Other Ambulatory Visit: Payer: Self-pay

## 2024-03-10 DIAGNOSIS — R404 Transient alteration of awareness: Secondary | ICD-10-CM

## 2024-03-10 DIAGNOSIS — R251 Tremor, unspecified: Secondary | ICD-10-CM

## 2024-03-17 ENCOUNTER — Telehealth: Payer: Self-pay | Admitting: Genetic Counselor

## 2024-04-06 ENCOUNTER — Ambulatory Visit

## 2024-04-06 DIAGNOSIS — R251 Tremor, unspecified: Secondary | ICD-10-CM

## 2024-04-06 DIAGNOSIS — R404 Transient alteration of awareness: Secondary | ICD-10-CM | POA: Diagnosis not present

## 2024-04-06 NOTE — Progress Notes (Unsigned)
 Ambulatory EEG hooked up and running. Light flashing. Push button tested. Camera and event log explained. Batteries explained. Patient understood.

## 2024-04-08 NOTE — Progress Notes (Signed)
 AMB EEG discontinued.  Skin Breakdown:No Diary Returned: Yes

## 2024-05-13 ENCOUNTER — Encounter: Payer: Self-pay | Admitting: Neurology

## 2024-05-13 ENCOUNTER — Ambulatory Visit: Payer: BC Managed Care – PPO | Admitting: Neurology

## 2024-05-13 VITALS — BP 125/82 | HR 120 | Ht 67.0 in | Wt 212.0 lb

## 2024-05-13 DIAGNOSIS — F445 Conversion disorder with seizures or convulsions: Secondary | ICD-10-CM | POA: Diagnosis not present

## 2024-05-13 DIAGNOSIS — G43009 Migraine without aura, not intractable, without status migrainosus: Secondary | ICD-10-CM

## 2024-05-13 NOTE — Patient Instructions (Addendum)
 Good to see you.  Let's try the Aimovig to help cut down on the headaches. Once you get the first dose, we can do teaching in the clinic. It is a once a month injection  2. Continue working with your other specialists   3. Follow-up in 4-5 months, call for any changes.

## 2024-05-13 NOTE — Progress Notes (Signed)
 NEUROLOGY FOLLOW UP OFFICE NOTE  Dana Lozano [id number] [birth year]  HISTORY OF PRESENT ILLNESS: I had the pleasure of seeing Dana Lozano in follow-up in the neurology clinic on 05/13/2024.  The patient was last seen 3 months ago. She is again  accompanied by her mother who helps supplement the history today.  Records and images were personally reviewed where available.  She was reporting episodes where she has tingling in her neck radiating diffusely then she senses her body shaking but cannot see it, losing consciousness. If her brain is feeling weird, she has tenderness on the occipital bone on the left side. No bigger episodes since 2022, however she has daily episodes where she feels something coming on, tingling in her neck/tenderness in the back of her head, jerking, and takes Valium at least once a day for these. Her 1-hour EEG in 02/2024 was normal. She had a 47-hour ambulatory EEG in 02/2024 where she had severe headache, tingling down arms, visual disturbance with light sensitivity, head tensing with nausea, dizziness. There were no EEG changes during these events.   She used to have migraines in junior high school. She tends to have more headaches around the time of her period or when interstitial cystitis gets really bad. She had headaches once a week on average, taking additional Valium for these. Some days she cannot think straight, she has difficulty remembering names, memory would be off, and takes Valium. She is concerned about "seizure type deals" where there is swelling in the brain meninges from the Lyme bacteria, causing throbbing, bad pressure, like her brain has a clamp on it. There is nausea, rare vomiting, dizziness, light and sound sensitivity where she has to be in a dark room. She states they are not the same feelings to when she used to have migraines. The occipital bones would be really tender. She takes Baclofen for interstitial cystitis/lower back tensing up, and  does not take it often. She also has gastroparesis, controls it with diet. Her fibromyalgia acts up with weather changes. They report an unexplained elevated copper level, she will be seeing a liver specialist to evaluate for concern for Wilson's disease.   Prior migraine preventative medications:Topamax, Amitriptyline, Depakote Prior migraine rescue medications: Imitrex (did help some but had side effects), Maxalt   History on Initial Assessment 01/29/2024: This is a 43 year old right-handed woman with a complicated history presenting for evaluation of seizures. They report that she was a high school senior when she started getting weird problems with severe exhaustion, pain, brain fog, issues concentrating. She gets soreness in the back of her neck and does not feel "all there." She was having these since 2001/2002 and started seeing Dr. Claressa Crock in [place] , [place] who diagnosed her with chronic lyme disease and had her on a protocol. Symptoms improved however when she was in her 35s, symptoms got worse and it felt like when she had Lyme disease when younger. 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 She started having episodes where she has tingling in her neck radiating diffusely then she senses her body shaking but cannot see it, losing consciousness. If her brain is feeling weird, she has tenderness on the occipital bone on the left side. She can call EMS that she is going into a seizure then she is completely out. When EMS arrives, she can sort  of understand but cannot answer. She has had urinary incontinence with these, no tongue bite or focal weakness. She was told she stopped breathing in the ambulance with 2 of the seizures, she could sense she could not breathe and was motioning this to EMS. Another time, she fell and hit her head and EMS had to break into her house. They  report the last time she had a bigger event like this was in 2022. She moved back in with her mother in 2022.  Dr. Claressa Crock started her on Trileptal 6mL BID (175mg  BID)  and Lamotrigine  ODT 100mg  2 tab TID, as well as Valium (she is prescribed 10mg  every 8 hours). When she feels something coming, she stays in bed, she has tingling in her neck and takes a Valium with "zaps it usually." She lays in a dark room with no sounds. She takes Valium at least once a day "to keep my nervous system calm." If she has the tenderness in the back of her head or body jerking, she takes a Valium. She is almost always feeling "kind of off sensory-wise, nerves just feel heightened."   She became tearful and states they could not find a neurologist that understands Lyme disease. She was evaluated at [facility] in 2018 where they reported seizures started in 2013. She reports seizure triggers include flashing lights, crazy sounds. EMU monitoring was recommended but she did not proceed with testing. Apparently Dr. Natha Bair office closed in February and she had to get her medications from her PCP. 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 Dr. Claressa Crock also started her on Lyrica for the pain.    PAST MEDICAL HISTORY: Past Medical History:  Diagnosis Date   Anemia, iron  deficiency 02/05/2013   Asthma    Bile reflux esophagitis    Bile salt-induced diarrhea    Chronic abdominal pain    Fibromyalgia    Gastroparesis    GERD (gastroesophageal reflux disease)    GERD (gastroesophageal reflux disease)    IBS (irritable bowel syndrome)    Interstitial cystitis    Irritable bowel    Kidney stones    Lyme disease    Migraine headache    Nausea vomiting and diarrhea    recurrent   Seizure (HCC)    Seizures (HCC)     MEDICATIONS: Current Outpatient Medications on File Prior to Visit  Medication Sig Dispense Refill   baclofen (LIORESAL) 20 MG tablet Take 20 mg  by mouth 3 (three) times daily as needed.     buprenorphine (BUTRANS) 20 MCG/HR PTWK Place onto the skin once a week. Wednesday     buPROPion (WELLBUTRIN XL) 300 MG 24 hr tablet Take 300 mg by mouth daily.     cetirizine (ZYRTEC) 10 MG tablet Take 10 mg by mouth daily.     cimetidine (TAGAMET) 200 MG tablet Take 200 mg by mouth at bedtime.      Coenzyme Q10 (COQ10 PO) Take by mouth. Procaps     diazepam (VALIUM) 10 MG tablet Take 10 mg by mouth every 8 (eight) hours as needed for anxiety (when feel like about to have a seizure).  2   diphenoxylate-atropine (LOMOTIL) 2.5-0.025 MG per tablet Take 2 tablets by mouth 4 (four) times daily as needed (IBS).      esomeprazole (NEXIUM) 20 MG capsule Take 40 mg by mouth 2 (two) times daily before a meal.      hydrOXYzine (ATARAX/VISTARIL) 25 MG  tablet Take 50 mg by mouth at bedtime.     lamoTRIgine  (LAMICTAL ) 100 MG tablet Take 200 mg by mouth 3 (three) times daily. 300 mg in the am     levonorgestrel-ethinyl estradiol (SEASONALE) 0.15-0.03 MG tablet Take 1 tablet by mouth every evening.      montelukast (SINGULAIR) 10 MG tablet Take 10 mg by mouth every evening.     Multiple Vitamin (MULTIVITAMIN) tablet Take 1 tablet by mouth daily. Procaps     ondansetron  (ZOFRAN ) 8 MG tablet Take 8 mg by mouth 3 (three) times daily as needed.     OXcarbazepine (TRILEPTAL) 300 MG/5ML suspension Take 175 mg by mouth 2 (two) times daily.     pentosan polysulfate (ELMIRON) 100 MG capsule Take 100-200 mg by mouth See admin instructions. Take 2 capsules by  mouth in the morning and 1 capsule in the evening     pravastatin (PRAVACHOL) 40 MG tablet Take 40 mg by mouth daily.     pregabalin (LYRICA) 25 MG capsule Take 25 mg by mouth 2 (two) times daily.     Probiotic Product (PROBIOTIC DAILY PO) Take 1 capsule by mouth 3 (three) times daily after meals.     QUEtiapine (SEROQUEL) 25 MG tablet Take 50 mg by mouth at bedtime.   3   venlafaxine XR (EFFEXOR-XR) 75 MG 24 hr capsule  Take 225 mg by mouth every morning.      No current facility-administered medications on file prior to visit.    ALLERGIES: Allergies  Allergen Reactions   Ibuprofen Anaphylaxis   Reglan  [Metoclopramide ] Other (See Comments)    Nerve pain   Levetiracetam  Nausea And Vomiting    Other reaction(s): Other (See Comments) Severe headache     Oxycodone -Acetaminophen  Nausea And Vomiting    Other reaction(s): Dizziness (intolerance)   Reglan  [Metoclopramide ] Other (See Comments)    Severe body pain    Doxycycline Diarrhea    severe   Lactose Intolerance (Gi) Diarrhea   Septra [Sulfamethoxazole-Trimethoprim] Other (See Comments)    Severe mouth ulcers, possibly caused seizures    FAMILY HISTORY: Family History  Problem Relation Age of Onset   Hypertension Father    Mental illness Father        depression    SOCIAL HISTORY: Social History   Socioeconomic History   Marital status: Single    Spouse name: n/a   Number of children: 0   Years of education: college   Highest education level: Not on file  Occupational History   Occupation: unemployed    Comment: denied disability x 2  Tobacco Use   Smoking status: Former    Current packs/day: 0.00    Types: Cigarettes    Quit date: 01/16/2003    Years since quitting: 21.3   Smokeless tobacco: Never  Vaping Use   Vaping status: Never Used  Substance and Sexual Activity   Alcohol use: No    Alcohol/week: 0.0 standard drinks of alcohol   Drug use: No   Sexual activity: Not Currently  Other Topics Concern   Not on file  Social History Narrative   ** Merged History Encounter ** Unable to work due to the neurologic effects of Lyme Disease. Lives alone. Her parents live in [place]. Her parents support her financially. Right handed    Social Drivers of Health   Financial Resource Strain: Not on file  Food Insecurity: Not on file  Transportation Needs: Not on file  Physical Activity: Not on file  Stress: Not  on file   Social Connections: Unknown (05/10/2022)   Received [facility]   Social Network    Social Network: Not on file  Intimate Partner Violence: Unknown (04/04/2022)   Received from [facility], [facility]   HITS    Physically Hurt: Not on file    Insult or Talk Down To: Not on file    Threaten Physical Harm: Not on file    Scream or Curse: Not on file     PHYSICAL EXAM: Vitals:   05/13/24 1330  BP: 125/82  Pulse: (!) 120  SpO2: 97%   General: No acute distress Head:  Normocephalic/atraumatic Skin/Extremities: No rash, no edema Neurological Exam: alert and awake. No aphasia or dysarthria. Fund of knowledge is appropriate. Attention and concentration are normal.   Cranial nerves: Pupils equal, round. Extraocular movements intact with no nystagmus. Visual fields full.  No facial asymmetry.  Motor: Bulk and tone normal, muscle strength 5/5 throughout with no pronator drift.   Finger to nose testing intact.  Gait narrow-based and steady, able to tandem walk adequately.  Romberg negative.   IMPRESSION: This is a 43 yo RH woman with a complicated history and diagnosis of "seizures" by a Lyme specialist in [place] who treated her for chronic lyme disease and started her on Trileptal, Lamotrigine , and Valium for these episodes. Her prolonged EEG captured typical episodes with no EEG correlate, we discussed the diagnosis and management of functional seizures (ie psychogenic non-epileptic events), many studies have recognized the benefit of CBT (Cognitive Behavioral Therapy) for managing functional seizures, as well as pharmacological interventions focusing on antidepressants and anxiolytics with Psychiatry. We discussed that Lamotrigine  and Trileptal are likely helping more with mood stabilization and agreed to continue these medications. The head pain she describes are migrainous in nature, albeit she reports they are different from her migraines in the past. We  discussed symptomatic management with migraine preventative medication, side effects of Aimovig discussed. She does not drive. Follow-up in 4-5 months, call for any changes.   Thank you for allowing me to participate in her care.  Please do not hesitate to call for any questions or concerns.    Rayfield Cairo, M.D.   CC: Alline Areas, PA-C

## 2024-05-30 MED ORDER — AIMOVIG 140 MG/ML ~~LOC~~ SOAJ: 1.0000 | SUBCUTANEOUS | 11 refills | Status: DC

## 2024-06-04 ENCOUNTER — Encounter: Payer: Self-pay | Admitting: Family

## 2024-06-08 ENCOUNTER — Encounter: Payer: Self-pay | Admitting: Family

## 2024-06-08 ENCOUNTER — Other Ambulatory Visit (HOSPITAL_COMMUNITY): Payer: Self-pay

## 2024-06-08 ENCOUNTER — Telehealth: Payer: Self-pay

## 2024-06-08 NOTE — Telephone Encounter (Signed)
 Pharmacy Patient Advocate Encounter   Received notification from Pt Calls Messages that prior authorization for Aimovig  140MG /ML auto-injectors is required/requested.   Insurance verification completed.   The patient is insured through [id number] .   Per test claim: Prior Authorization form/request asks a question that requires your assistance. Please see the question below and advise accordingly. The PA will not be submitted until the necessary information is received.

## 2024-06-08 NOTE — Telephone Encounter (Signed)
 Pt needs a PA for her aimovig

## 2024-06-08 NOTE — Telephone Encounter (Signed)
 Pt called no answer left a voice mail to all the office back when she calls back I will ask her confirm how many headache days a month

## 2024-06-08 NOTE — Telephone Encounter (Signed)
 Pls call patient and confirm how many headache days a month, thanks

## 2024-06-09 NOTE — Telephone Encounter (Signed)
 Spoke with pt mother who is on the DPR she is going to let the pt know that we need to know how many headache days a month she has

## 2024-06-09 NOTE — Procedures (Signed)
 ELECTROENCEPHALOGRAM REPORT  Dates of Recording: 04/06/2024 3:34PM to 04/08/2024 3:14PM  Patient's Name: Dana Lozano MRN: [id number] Date of Birth: [birth year]  Referring Provider: Dr. Rayfield Cairo  Procedure: 47-hour ambulatory EEG  History: This is a 43 year old woman with episodes where she has tingling in her neck radiating diffusely then she senses her body shaking but cannot see it, losing consciousness.   CNS Active Medications: Trileptal, Lamotrigine , Valium, Lyrica, Seroquel, Venlafaxine, Bupropion, Baclofen  Technical Summary: This is a 47-hour multichannel digital video EEG recording measured by the international 10-20 system with electrodes applied with paste and impedances below 5000 ohms performed as portable with EKG monitoring.  The digital EEG was referentially recorded, reformatted, and digitally filtered in a variety of bipolar and referential montages for optimal display.    DESCRIPTION OF RECORDING: During maximal wakefulness, the background activity consisted of a symmetric 8 Hz posterior dominant rhythm which was reactive to eye opening.  There were no epileptiform discharges or focal slowing seen in wakefulness.  During the recording, the patient progresses through wakefulness, drowsiness, and Stage 2 sleep.  Again, there were no epileptiform discharges seen.  Events: Patient did not push event button but reported the following events: At 4/7 at 2040 hours, she had a severe "headache" with tingling down arms and visual disturbance with light sensitivity. No video captured. Electrographically, there were no EEG or EKG changes seen.  At 4/8 at 0105 hours, her head "tenses" up and becomes incredibly nauseous, dizziness, headache. No video captured. Electrographically, there were no EEG or EKG changes seen.   There were no electrographic seizures seen.  EKG lead was unremarkable then there were technical difficulties after 4/8 0900 hours.  IMPRESSION: This  47-hour ambulatory EEG study is normal.    CLINICAL CORRELATION: Typical episodes of headache with tingling, visual disturbance, nausea, dizziness, did not show EEG correlate, indicating these are non-epileptic. No video available for review. If further clinical questions remain, inpatient video EEG monitoring may be helpful.   Rayfield Cairo, M.D.

## 2024-06-09 NOTE — Procedures (Incomplete)
 ELECTROENCEPHALOGRAM REPORT  Dates of Recording: 04/06/2024 3:34PM to 04/08/2024 3:14PM  Patient's Name: Dana Lozano MRN: [id number] Date of Birth: 12-Sep-1981  Referring Provider: Dr. Rayfield Cairo  Procedure: 47-hour ambulatory EEG  History: ***  Medications: ***  Technical Summary: This is a ***-hour multichannel digital video EEG recording measured by the international 10-20 system with electrodes applied with paste and impedances below 5000 ohms performed as portable with EKG monitoring.  The digital EEG was referentially recorded, reformatted, and digitally filtered in a variety of bipolar and referential montages for optimal display.    DESCRIPTION OF RECORDING: During maximal wakefulness, the background activity consisted of a symmetric *** Hz posterior dominant rhythm which was reactive to eye opening.  There were no epileptiform discharges or focal slowing seen in wakefulness.  During the recording, the patient progresses through wakefulness, drowsiness, and Stage 2 sleep.  Again, there were no epileptiform discharges seen.  Events:  There were no electrographic seizures seen.  EKG lead was unremarkable.  IMPRESSION: This ***-hour ambulatory video EEG study is normal.    CLINICAL CORRELATION: A normal EEG does not exclude a clinical diagnosis of epilepsy.  If further clinical questions remain, inpatient video EEG monitoring may be helpful.   Rayfield Cairo, M.D.

## 2024-06-10 NOTE — Telephone Encounter (Signed)
 Pt is having 16 headache days a month

## 2024-06-11 NOTE — Telephone Encounter (Signed)
 Pharmacy Patient Advocate Encounter    Per test claim: PA required; PA submitted to above mentioned insurance via CoverMyMeds Key/confirmation #/EOC [id number] Status is pending

## 2024-06-12 NOTE — Telephone Encounter (Signed)
 Patient mother advised. And to try aimovig  [email or web address]

## 2024-06-12 NOTE — Telephone Encounter (Signed)
 Pharmacy Patient Advocate Encounter  Received notification from [facility] that Prior Authorization for Aimovig  140MG /ML auto-injectors has been APPROVED from 06-11-2024 to 09-03-2024   PA #/Case ID/Reference #: [id number]

## 2024-06-24 ENCOUNTER — Telehealth: Payer: Self-pay | Admitting: Neurology

## 2024-06-24 NOTE — Telephone Encounter (Signed)
 Pt Dana Lozano she needs to talk with heather about making an appt to learn how to do a shot.

## 2024-06-25 ENCOUNTER — Ambulatory Visit

## 2024-06-25 ENCOUNTER — Encounter: Payer: Self-pay | Admitting: Family

## 2024-06-30 ENCOUNTER — Ambulatory Visit

## 2024-06-30 DIAGNOSIS — G43009 Migraine without aura, not intractable, without status migrainosus: Secondary | ICD-10-CM

## 2024-06-30 MED ORDER — ERENUMAB-AOOE 140 MG/ML ~~LOC~~ SOAJ
140.0000 mg | Freq: Once | SUBCUTANEOUS | Status: AC
  Administered 2024-06-30: 140 mg via SUBCUTANEOUS

## 2024-06-30 MED ORDER — ERENUMAB-AOOE 140 MG/ML ~~LOC~~ SOAJ: 140.0000 mg | Freq: Once | SUBCUTANEOUS | Status: DC

## 2024-06-30 NOTE — Progress Notes (Signed)
 Patient presents in the office for Aimovig  training and injection.  Patient supplied the Aimovig .

## 2024-07-28 ENCOUNTER — Ambulatory Visit: Admitting: Medical Genetics

## 2024-09-15 ENCOUNTER — Ambulatory Visit: Admitting: Neurology

## 2024-12-01 ENCOUNTER — Encounter: Payer: Self-pay | Admitting: Neurology

## 2025-01-08 ENCOUNTER — Telehealth: Admitting: Neurology

## 2025-01-08 ENCOUNTER — Encounter: Payer: Self-pay | Admitting: Neurology

## 2025-01-08 VITALS — Ht 67.0 in | Wt 200.0 lb

## 2025-01-08 DIAGNOSIS — G43009 Migraine without aura, not intractable, without status migrainosus: Secondary | ICD-10-CM

## 2025-01-08 DIAGNOSIS — F445 Conversion disorder with seizures or convulsions: Secondary | ICD-10-CM

## 2025-01-08 NOTE — Patient Instructions (Signed)
 Good to see you. I wish you all the best. Continue follow-up with all your providers. Follow-up in as needed, call for any changes.

## 2025-01-08 NOTE — Progress Notes (Signed)
 "  Virtual Visit via Video Note  This visit type was conducted with patient consent. This format is felt to be most appropriate for this patient at this time. Physical exam was limited by quality of the video and audio technology used for the visit.    Consent was obtained for video visit:  Yes.   Answered questions that patient had about telehealth interaction:  Yes.   Patient is aware of the limitations, risks, security and privacy concerns of performing an evaluation and management service by telemedicine. The patient expressed understanding and agreed to proceed.  Pt location: Home Physician Location: office Name of referring provider:  Jerome Heron Ruth, PA-C I connected with Dana Lozano at patients initiation/request on 01/08/2025 at  4:00 PM EST by video enabled telemedicine application and verified that I am speaking with the correct person using two identifiers. Pt MRN:  [id number] Pt DOB:  [birth year] Video Participants:  Dana Bolt Perdue  Discussed the use of AI scribe software for clinical note transcription with the patient, who gave verbal consent to proceed.  History of Present Illness The patient had a virtual video visit on 01/08/2025. She was last seen in the neurology clinic 7 months ago. 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 Ambulatory EEG in 02/2024 normal. She is on Lamotrigine  and Oxcarbazepine for mood stabilization. She is happy to report that she has not had a seizure in the last 6 months, there was just one night she felt she 'would go there,' took a Valium and calmed it down. She reports her nervous system acts up with bad tremors and neuropathy once a week, where she would be dropping things. On her last visit, she was reporting headaches once a week, she has a history of migraines. She was prescribed Aimovig  but states she only took it for 2 months because she felt it was not  helping. She states she does not get headaches regularly, mostly tension headaches when her nerves are acting up, 'definitely not migraines.' She has been seeing Pain Management and has a Butrans patch and Lyrica, which she feels have been very helpful. When she takes a dose late, there is increased pain. She experiences significant pelvic floor pain and has been told by her pain specialist that it is likely related to her interstitial cystitis. She has a spinal cord stimulator that targets specific problematic nerves, which has been beneficial in managing her pain.   History on Initial Assessment 01/29/2024: This is a 44 year old right-handed woman with a complicated history presenting for evaluation of seizures. They report that she was a high school senior when she started getting weird problems with severe exhaustion, pain, brain fog, issues concentrating. She gets soreness in the back of her neck and does not feel all there. She was having these since 2001/2002 and started seeing Dr. Severo in [place] , [place] who diagnosed her with chronic lyme disease and had her on a protocol. Symptoms improved however when she was in her 8s, symptoms got worse and it felt like when she had Lyme disease when younger. 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 She started having episodes where she has tingling in her neck radiating diffusely then she senses her body shaking but cannot see it, losing consciousness. If her brain is feeling  weird, she has tenderness on the occipital bone on the left side. She can call EMS that she is going into a seizure then she is completely out. When EMS arrives, she can sort of understand but cannot answer. She has had urinary incontinence with these, no tongue bite or focal weakness. She was told she stopped breathing in the ambulance with 2 of the seizures, she  could sense she could not breathe and was motioning this to EMS. Another time, she fell and hit her head and EMS had to break into her house. They report the last time she had a bigger event like this was in 2022. She moved back in with her mother in 2022.  Dr. Severo started her on Trileptal 6mL BID (175mg  BID)  and Lamotrigine  ODT 100mg  2 tab TID, as well as Valium (she is prescribed 10mg  every 8 hours). When she feels something coming, she stays in bed, she has tingling in her neck and takes a Valium with zaps it usually. She lays in a dark room with no sounds. She takes Valium at least once a day to keep my nervous system calm. If she has the tenderness in the back of her head or body jerking, she takes a Valium. She is almost always feeling kind of off sensory-wise, nerves just feel heightened.   She became tearful and states they could not find a neurologist that understands Lyme disease. She was evaluated at [facility] in 2018 where they reported seizures started in 2013. She reports seizure triggers include flashing lights, crazy sounds. EMU monitoring was recommended but she did not proceed with testing. Apparently Dr. Junella office closed in February and she had to get her medications from her PCP. 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 Dr. Severo also started her on Lyrica for the pain.   Prior migraine preventative medications:Topamax, Amitriptyline, Depakote, Aimovig  Prior migraine rescue medications: Imitrex (did help some but had side effects), Maxalt   Medications Ordered Prior to Encounter[1]   Observations/Objective:   Vitals:   01/08/25 1350  Weight: 200 lb (90.7 kg)  Height: 5' 7 (1.702 m)   GEN:  The patient appears stated age and is in NAD.  Neurological examination: Patient is awake, alert. No aphasia or dysarthria. Intact fluency and comprehension. Cranial nerves: Extraocular movements intact. No  facial asymmetry. Motor: moves all extremities symmetrically, at least anti-gravity x 4.    Assessment and Plan:   This is a 44 yo RH woman with a complicated history and diagnosis of seizures by a Lyme specialist in [place]  [place] who treated her for chronic lyme disease and started her on Trileptal, Lamotrigine , and Valium for these episodes. Her prolonged EEG captured typical episodes with no EEG correlate, consistent with functional seizures (ie psychogenic non-epileptic events). She states she has not had a seizure in 6 months. She has found benefit with management of pain symptoms with her current specialist, continue follow-up with GI and Pain Management. She states she is not having migraines, no indication for migraine preventative medication at this time. Follow-up as needed, call for any changes.    Follow Up Instructions:    -I discussed the assessment and treatment plan with the patient. The patient was provided an opportunity to ask questions and all were answered. The patient agreed with the plan and demonstrated an understanding of the instructions.   The patient was advised to call back or seek an in-person evaluation if  the symptoms worsen or if the condition fails to improve as anticipated.     Darice CHRISTELLA Shivers, MD    [1]  Current Outpatient Medications on File Prior to Visit  Medication Sig Dispense Refill   baclofen (LIORESAL) 20 MG tablet Take 20 mg by mouth 3 (three) times daily as needed.     buprenorphine (BUTRANS) 20 MCG/HR PTWK Place onto the skin once a week. Wednesday     buPROPion (WELLBUTRIN XL) 300 MG 24 hr tablet Take 300 mg by mouth daily.     cetirizine (ZYRTEC) 10 MG tablet Take 10 mg by mouth daily.     cimetidine (TAGAMET) 200 MG tablet Take 200 mg by mouth at bedtime.      Coenzyme Q10 (COQ10 PO) Take by mouth. Procaps     diazepam (VALIUM) 10 MG tablet Take 10 mg by mouth every 8 (eight) hours as needed for anxiety (when feel like about to have a  seizure).  2   diphenoxylate-atropine (LOMOTIL) 2.5-0.025 MG per tablet Take 2 tablets by mouth 4 (four) times daily as needed (IBS).      esomeprazole (NEXIUM) 20 MG capsule Take 40 mg by mouth 2 (two) times daily before a meal.      hydrOXYzine (ATARAX/VISTARIL) 25 MG tablet Take 50 mg by mouth at bedtime.     hyoscyamine (ANASPAZ) 0.125 MG TBDP disintergrating tablet Place 0.125 mg under the tongue every 6 (six) hours as needed.     lamoTRIgine  (LAMICTAL ) 100 MG tablet Take 200 mg by mouth 3 (three) times daily. 300 mg in the am     levonorgestrel-ethinyl estradiol (SEASONALE) 0.15-0.03 MG tablet Take 1 tablet by mouth every evening.      montelukast (SINGULAIR) 10 MG tablet Take 10 mg by mouth every evening.     Multiple Vitamin (MULTIVITAMIN) tablet Take 1 tablet by mouth daily. Procaps     ondansetron  (ZOFRAN ) 8 MG tablet Take 8 mg by mouth 3 (three) times daily as needed.     OXcarbazepine (TRILEPTAL) 300 MG/5ML suspension Take 175 mg by mouth 2 (two) times daily.     pravastatin (PRAVACHOL) 40 MG tablet Take 40 mg by mouth daily.     pregabalin (LYRICA) 25 MG capsule Take 25 mg by mouth 2 (two) times daily.     Probiotic Product (PROBIOTIC DAILY PO) Take 1 capsule by mouth 3 (three) times daily after meals.     QUEtiapine (SEROQUEL) 25 MG tablet Take 50 mg by mouth at bedtime.   3   venlafaxine XR (EFFEXOR-XR) 75 MG 24 hr capsule Take 225 mg by mouth every morning.      Erenumab -aooe (AIMOVIG ) 140 MG/ML SOAJ Inject 140 mg into the skin every 30 (thirty) days. (Patient not taking: Reported on 01/08/2025) 1.12 mL 11   pentosan polysulfate (ELMIRON) 100 MG capsule Take 100-200 mg by mouth See admin instructions. Take 2 capsules by  mouth in the morning and 1 capsule in the evening (Patient not taking: Reported on 01/08/2025)     No current facility-administered medications on file prior to visit.   "

## 2025-04-08 ENCOUNTER — Ambulatory Visit: Admitting: Neurology

## 2025-04-09 ENCOUNTER — Ambulatory Visit: Admitting: Neurology

## 2025-04-26 ENCOUNTER — Ambulatory Visit: Admitting: Neurology
